# Patient Record
Sex: Female | Born: 1935 | Race: White | Hispanic: No | Marital: Married | Smoking: Never smoker
Health system: Southern US, Community
[De-identification: ages and names within clinical notes are randomized; demographics above are authoritative.]

## PROBLEM LIST (undated history)

## (undated) DIAGNOSIS — J449 Chronic obstructive pulmonary disease, unspecified: Secondary | ICD-10-CM

## (undated) DIAGNOSIS — E039 Hypothyroidism, unspecified: Secondary | ICD-10-CM

## (undated) DIAGNOSIS — I4891 Unspecified atrial fibrillation: Secondary | ICD-10-CM

## (undated) DIAGNOSIS — F0390 Unspecified dementia without behavioral disturbance: Secondary | ICD-10-CM

## (undated) DIAGNOSIS — E0590 Thyrotoxicosis, unspecified without thyrotoxic crisis or storm: Secondary | ICD-10-CM

## (undated) DIAGNOSIS — I9589 Other hypotension: Secondary | ICD-10-CM

## (undated) DIAGNOSIS — G4700 Insomnia, unspecified: Secondary | ICD-10-CM

## (undated) DIAGNOSIS — W19XXXA Unspecified fall, initial encounter: Secondary | ICD-10-CM

## (undated) DIAGNOSIS — G3184 Mild cognitive impairment, so stated: Secondary | ICD-10-CM

## (undated) DIAGNOSIS — I5032 Chronic diastolic (congestive) heart failure: Secondary | ICD-10-CM

## (undated) DIAGNOSIS — I10 Essential (primary) hypertension: Secondary | ICD-10-CM

## (undated) DIAGNOSIS — E785 Hyperlipidemia, unspecified: Secondary | ICD-10-CM

## (undated) HISTORY — PX: ABDOMINAL HYSTERECTOMY: SHX81

## (undated) HISTORY — DX: Mild cognitive impairment of uncertain or unknown etiology: G31.84

## (undated) HISTORY — PX: OTHER SURGICAL HISTORY: SHX169

## (undated) HISTORY — DX: Insomnia, unspecified: G47.00

## (undated) HISTORY — PX: CHOLECYSTECTOMY: SHX55

## (undated) HISTORY — PX: APPENDECTOMY: SHX54

---

## 2020-12-09 HISTORY — PX: OTHER SURGICAL HISTORY: SHX169

## 2021-03-02 ENCOUNTER — Emergency Department (HOSPITAL_BASED_OUTPATIENT_CLINIC_OR_DEPARTMENT_OTHER)
Admission: EM | Admit: 2021-03-02 | Discharge: 2021-03-03 | Disposition: A | Discharge status: Home / Self Care | Payer: Medicare Other | Attending: Emergency Medicine | Admitting: Emergency Medicine

## 2021-03-02 ENCOUNTER — Encounter (HOSPITAL_BASED_OUTPATIENT_CLINIC_OR_DEPARTMENT_OTHER): Payer: Self-pay | Admitting: *Deleted

## 2021-03-02 ENCOUNTER — Other Ambulatory Visit: Payer: Self-pay

## 2021-03-02 ENCOUNTER — Emergency Department (HOSPITAL_BASED_OUTPATIENT_CLINIC_OR_DEPARTMENT_OTHER): Payer: Medicare Other

## 2021-03-02 DIAGNOSIS — I10 Essential (primary) hypertension: Secondary | ICD-10-CM | POA: Diagnosis not present

## 2021-03-02 DIAGNOSIS — S8002XA Contusion of left knee, initial encounter: Secondary | ICD-10-CM | POA: Insufficient documentation

## 2021-03-02 DIAGNOSIS — Z7901 Long term (current) use of anticoagulants: Secondary | ICD-10-CM | POA: Diagnosis not present

## 2021-03-02 DIAGNOSIS — I4891 Unspecified atrial fibrillation: Secondary | ICD-10-CM | POA: Diagnosis not present

## 2021-03-02 DIAGNOSIS — Z79899 Other long term (current) drug therapy: Secondary | ICD-10-CM | POA: Insufficient documentation

## 2021-03-02 DIAGNOSIS — J449 Chronic obstructive pulmonary disease, unspecified: Secondary | ICD-10-CM | POA: Diagnosis not present

## 2021-03-02 DIAGNOSIS — W19XXXA Unspecified fall, initial encounter: Secondary | ICD-10-CM

## 2021-03-02 DIAGNOSIS — M25562 Pain in left knee: Secondary | ICD-10-CM

## 2021-03-02 DIAGNOSIS — S8992XA Unspecified injury of left lower leg, initial encounter: Secondary | ICD-10-CM | POA: Diagnosis present

## 2021-03-02 DIAGNOSIS — W050XXA Fall from non-moving wheelchair, initial encounter: Secondary | ICD-10-CM | POA: Diagnosis not present

## 2021-03-02 HISTORY — DX: Essential (primary) hypertension: I10

## 2021-03-02 HISTORY — DX: Thyrotoxicosis, unspecified without thyrotoxic crisis or storm: E05.90

## 2021-03-02 HISTORY — DX: Chronic obstructive pulmonary disease, unspecified: J44.9

## 2021-03-02 HISTORY — DX: Unspecified atrial fibrillation: I48.91

## 2021-03-02 MED ORDER — ACETAMINOPHEN 500 MG PO TABS
1000.0000 mg | ORAL_TABLET | Freq: Once | ORAL | Status: AC
  Administered 2021-03-02: 1000 mg via ORAL
  Filled 2021-03-02: qty 2

## 2021-03-02 NOTE — ED Provider Notes (Signed)
[facility] EMERGENCY DEPT Provider Note   CSN: [id number] Arrival date & time: 03/02/21  2014     History Chief Complaint  Patient presents with   Knee Pain    Fall     Lindsay Rush is a 86 y.o. female.  HPI     86 year old female with a history of atrial fibrillation, COPD, hypertension, thyrotoxicosis, left total knee surgery approximately 11 weeks ago, presents with concern for fall.  She slid out of her wheelchair and landed on both knees, now has left knee/lower leg pain.  She has been nonweightbearing on her left knee since her knee surgery per patient, however per daughter she walks down the hallway of assisted living facility with her walker to lobby three times a week.  She did not hit her head. Has been taking xarelto.  Thinks the seat of the wheelchair slid and she slid out and landed on both knees, does not have pain in right knee, does have left knee pain worse than what she had related to her recent arthroplasty. Denies other areas of pain, other acute illness. Ha previous admission for CHF while in [place], has been improved, not having dyspnea> Did note some right leg swelling. Has been taking xarelto at facility.  Past Medical History:  Diagnosis Date   Atrial fibrillation (HCC)    COPD (chronic obstructive pulmonary disease) (HCC)    Hypertension    Thyrotoxicosis     There are no problems to display for this patient.   History reviewed. No pertinent surgical history.   OB History   No obstetric history on file.     No family history on file.     Home Medications Prior to Admission medications   Medication Sig Start Date End Date Taking? Authorizing Provider  escitalopram (LEXAPRO) 5 MG tablet Take 5 mg by mouth daily.   Yes [provider]  fludrocortisone (FLORINEF) 0.1 MG tablet Take 0.1 mg by mouth daily.   Yes [provider]  furosemide (LASIX) 40 MG tablet Take 40 mg by mouth.   Yes [provider]   midodrine (PROAMATINE) 5 MG tablet Take 5 mg by mouth 3 (three) times daily with meals.   Yes [provider]  Rivaroxaban (XARELTO) 15 MG TABS tablet Take 15 mg by mouth 2 (two) times daily with a meal.   Yes [provider]    Allergies    Codeine  Review of Systems   Review of Systems  Constitutional:  Negative for fever.  Respiratory:  Negative for cough and shortness of breath.   Cardiovascular:  Positive for leg swelling. Negative for chest pain.  Gastrointestinal:  Negative for nausea and vomiting.  Musculoskeletal:  Positive for arthralgias. Negative for back pain and neck pain.  Skin:  Negative for rash.  Neurological:  Negative for syncope and headaches.   Physical Exam Updated Vital Signs BP (!) 170/58   Pulse (!) 59   Temp 98.3 F (36.8 C) (Oral)   Resp (!) 31   Ht 5\' 2"  (1.575 m)   Wt 59 kg   SpO2 93%   BMI 23.78 kg/m   Physical Exam Vitals and nursing note reviewed.  Constitutional:      General: She is not in acute distress.    Appearance: She is well-developed. She is not diaphoretic.  HENT:     Head: Normocephalic and atraumatic.  Eyes:     Conjunctiva/sclera: Conjunctivae normal.  Cardiovascular:     Rate and Rhythm: Normal  rate. Rhythm irregular.  Pulmonary:     Effort: Pulmonary effort is normal. No respiratory distress.  Abdominal:     Palpations: Abdomen is soft.  Musculoskeletal:        General: Swelling and tenderness (left knee) present.     Cervical back: Normal range of motion.  Skin:    General: Skin is warm and dry.     Findings: Bruising present. No erythema or rash.  Neurological:     Mental Status: She is alert and oriented to person, place, and time.    ED Results / Procedures / Treatments   Labs (all labs ordered are listed, but only abnormal results are displayed) Labs Reviewed - No data to display  EKG EKG Interpretation  Date/Time:  Sunday March 02 2021 21:33:57 EDT Ventricular Rate:  55 PR  Interval:    QRS Duration: 86 QT Interval:  426 QTC Calculation: 408 R Axis:   68 Text Interpretation: Atrial fibrillation Repol abnrm, severe global ischemia (LM/MVD) Discussed with Dr. Ganji, atrial fibrillation Confirmed by Juniper Cobey ([id number]) on 03/02/2021 10:01:10 PM  Radiology DG Knee Complete 4 Views Left  Result Date: 03/02/2021 CLINICAL DATA:  Fall and trauma to the left knee. EXAM: LEFT KNEE - COMPLETE 4+ VIEW COMPARISON:  None. FINDINGS: There is a total left knee arthroplasty. The arthroplasty components appear intact and in anatomic alignment. There is no acute fracture or dislocation. The bones are osteopenic. No significant joint effusion. The soft tissues are grossly unremarkable. IMPRESSION: 1. No acute fracture or dislocation. 2. Left knee arthroplasty appears intact and in anatomic alignment. Electronically Signed   By: Arash  Radparvar M.D.   On: 03/02/2021 22:16    Procedures Procedures   Medications Ordered in ED Medications  acetaminophen (TYLENOL) tablet 1,000 mg (1,000 mg Oral Given 03/02/21 2207)    ED Course  I have reviewed the triage vital signs and the nursing notes.  Pertinent labs & imaging results that were available during my care of the patient were reviewed by me and considered in my medical decision making (see chart for details).    MDM Rules/Calculators/A&P                           85  year old female with a history of atrial fibrillation, COPD, hypertension, thyrotoxicosis, left total knee surgery approximately 11 weeks ago, presents with concern for fall, sliding out of wheelchair onto her knees.  Is on anticoagulation but no head injury, no headache, no n/v, and given mechanism without head injury have low suspicion for ICH.  Denies other injuries or acute medical concerns.  HR was down to 40s with pulse oximeter HR, EKG obtained and discussed with Dr. Cardiology, consistent with atrial fibrillation.  XR knee without fracture. She  was able to stand per her baseline, low suspicion at this time for occult fracture and feel increasd pain is likely secondary to bruising.  Recommend Orthopedic follow up.   She is new to the area and needs to establish care with specialists for her chronic medical problems.  Provide number for urologist for discussion of chronic foley/removal, Cardiologist, and Orthopedics.    Final Clinical Impression(s) / ED Diagnoses Final diagnoses:  Fall, initial encounter  Acute pain of left knee  Atrial fibrillation, unspecified type [facility])    Rx / DC Orders ED Discharge Orders     None        [facility], MD 03/03/21 [phone]

## 2021-03-02 NOTE — ED Notes (Signed)
MD at bedside. 

## 2021-03-02 NOTE — ED Triage Notes (Signed)
Pt arrived by ptar with c/o fall approx 5 hours pta. Pt states she slid out of her wheelchair and landed on both knees. C/o left knee/lower leg pain. Pt states she has been non weight bearing since her surgery. Pt states she has a foley cath inserted after her surgery but is not sure why she has the foley. Denies hitting her head when she fell.
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
## 2021-03-02 NOTE — ED Notes (Signed)
PT stood at bedside and ambulated with assistance. Pt states she feels like she is walking at her normal gait and capacity and is not hurting in her left knee.

## 2021-04-03 ENCOUNTER — Other Ambulatory Visit: Payer: Self-pay

## 2021-04-03 ENCOUNTER — Emergency Department (HOSPITAL_COMMUNITY): Payer: Medicare Other

## 2021-04-03 ENCOUNTER — Inpatient Hospital Stay (HOSPITAL_COMMUNITY)
Admission: EM | Admit: 2021-04-03 | Discharge: 2021-04-08 | DRG: 291 | Disposition: A | Discharge status: Home / Self Care | Payer: Medicare Other | Attending: Internal Medicine | Admitting: Internal Medicine

## 2021-04-03 DIAGNOSIS — I110 Hypertensive heart disease with heart failure: Principal | ICD-10-CM | POA: Diagnosis present

## 2021-04-03 DIAGNOSIS — E039 Hypothyroidism, unspecified: Secondary | ICD-10-CM | POA: Diagnosis present

## 2021-04-03 DIAGNOSIS — J9601 Acute respiratory failure with hypoxia: Secondary | ICD-10-CM | POA: Diagnosis present

## 2021-04-03 DIAGNOSIS — Z8249 Family history of ischemic heart disease and other diseases of the circulatory system: Secondary | ICD-10-CM

## 2021-04-03 DIAGNOSIS — Z7901 Long term (current) use of anticoagulants: Secondary | ICD-10-CM

## 2021-04-03 DIAGNOSIS — F0390 Unspecified dementia without behavioral disturbance: Secondary | ICD-10-CM | POA: Diagnosis present

## 2021-04-03 DIAGNOSIS — Z79891 Long term (current) use of opiate analgesic: Secondary | ICD-10-CM

## 2021-04-03 DIAGNOSIS — B961 Klebsiella pneumoniae [K. pneumoniae] as the cause of diseases classified elsewhere: Secondary | ICD-10-CM | POA: Diagnosis present

## 2021-04-03 DIAGNOSIS — E785 Hyperlipidemia, unspecified: Secondary | ICD-10-CM | POA: Diagnosis present

## 2021-04-03 DIAGNOSIS — J449 Chronic obstructive pulmonary disease, unspecified: Secondary | ICD-10-CM | POA: Diagnosis present

## 2021-04-03 DIAGNOSIS — R0902 Hypoxemia: Secondary | ICD-10-CM

## 2021-04-03 DIAGNOSIS — I5033 Acute on chronic diastolic (congestive) heart failure: Secondary | ICD-10-CM | POA: Diagnosis present

## 2021-04-03 DIAGNOSIS — I341 Nonrheumatic mitral (valve) prolapse: Secondary | ICD-10-CM | POA: Diagnosis present

## 2021-04-03 DIAGNOSIS — W06XXXA Fall from bed, initial encounter: Secondary | ICD-10-CM | POA: Diagnosis present

## 2021-04-03 DIAGNOSIS — Z885 Allergy status to narcotic agent status: Secondary | ICD-10-CM

## 2021-04-03 DIAGNOSIS — Z9181 History of falling: Secondary | ICD-10-CM

## 2021-04-03 DIAGNOSIS — I700 Atherosclerosis of aorta: Secondary | ICD-10-CM | POA: Diagnosis present

## 2021-04-03 DIAGNOSIS — I4821 Permanent atrial fibrillation: Secondary | ICD-10-CM | POA: Diagnosis present

## 2021-04-03 DIAGNOSIS — Z79899 Other long term (current) drug therapy: Secondary | ICD-10-CM

## 2021-04-03 DIAGNOSIS — E876 Hypokalemia: Secondary | ICD-10-CM | POA: Diagnosis present

## 2021-04-03 DIAGNOSIS — T1490XA Injury, unspecified, initial encounter: Secondary | ICD-10-CM

## 2021-04-03 DIAGNOSIS — Z8673 Personal history of transient ischemic attack (TIA), and cerebral infarction without residual deficits: Secondary | ICD-10-CM

## 2021-04-03 DIAGNOSIS — Z1611 Resistance to penicillins: Secondary | ICD-10-CM | POA: Diagnosis present

## 2021-04-03 DIAGNOSIS — E119 Type 2 diabetes mellitus without complications: Secondary | ICD-10-CM | POA: Diagnosis present

## 2021-04-03 DIAGNOSIS — N390 Urinary tract infection, site not specified: Secondary | ICD-10-CM | POA: Diagnosis present

## 2021-04-03 DIAGNOSIS — G9341 Metabolic encephalopathy: Secondary | ICD-10-CM | POA: Diagnosis present

## 2021-04-03 DIAGNOSIS — F03A0 Unspecified dementia, mild, without behavioral disturbance, psychotic disturbance, mood disturbance, and anxiety: Secondary | ICD-10-CM | POA: Diagnosis present

## 2021-04-03 DIAGNOSIS — Z79890 Hormone replacement therapy (postmenopausal): Secondary | ICD-10-CM

## 2021-04-03 DIAGNOSIS — I9589 Other hypotension: Secondary | ICD-10-CM | POA: Diagnosis present

## 2021-04-03 DIAGNOSIS — S0003XA Contusion of scalp, initial encounter: Secondary | ICD-10-CM | POA: Diagnosis present

## 2021-04-03 DIAGNOSIS — Z7951 Long term (current) use of inhaled steroids: Secondary | ICD-10-CM

## 2021-04-03 DIAGNOSIS — Z20822 Contact with and (suspected) exposure to covid-19: Secondary | ICD-10-CM | POA: Diagnosis present

## 2021-04-03 DIAGNOSIS — I482 Chronic atrial fibrillation, unspecified: Secondary | ICD-10-CM | POA: Diagnosis present

## 2021-04-03 DIAGNOSIS — R0600 Dyspnea, unspecified: Secondary | ICD-10-CM

## 2021-04-03 HISTORY — DX: Hyperlipidemia, unspecified: E78.5

## 2021-04-03 HISTORY — DX: Unspecified fall, initial encounter: W19.XXXA

## 2021-04-03 HISTORY — DX: Chronic diastolic (congestive) heart failure: I50.32

## 2021-04-03 HISTORY — DX: Unspecified dementia, unspecified severity, without behavioral disturbance, psychotic disturbance, mood disturbance, and anxiety: F03.90

## 2021-04-03 HISTORY — DX: Other hypotension: I95.89

## 2021-04-03 HISTORY — DX: Hypothyroidism, unspecified: E03.9

## 2021-04-03 LAB — CBC
HCT: 41.9 % (ref 36.0–46.0)
Hemoglobin: 13.2 g/dL (ref 12.0–15.0)
MCH: 30.1 pg (ref 26.0–34.0)
MCHC: 31.5 g/dL (ref 30.0–36.0)
MCV: 95.4 fL (ref 80.0–100.0)
Platelets: 291 10*3/uL (ref 150–400)
RBC: 4.39 MIL/uL (ref 3.87–5.11)
RDW: 15 % (ref 11.5–15.5)
WBC: 10.3 10*3/uL (ref 4.0–10.5)
nRBC: 0 % (ref 0.0–0.2)

## 2021-04-03 LAB — I-STAT CHEM 8, ED
BUN: 6 mg/dL — ABNORMAL LOW (ref 8–23)
Calcium, Ion: 0.99 mmol/L — ABNORMAL LOW (ref 1.15–1.40)
Chloride: 101 mmol/L (ref 98–111)
Creatinine, Ser: 0.7 mg/dL (ref 0.44–1.00)
Glucose, Bld: 99 mg/dL (ref 70–99)
HCT: 42 % (ref 36.0–46.0)
Hemoglobin: 14.3 g/dL (ref 12.0–15.0)
Potassium: 3.3 mmol/L — ABNORMAL LOW (ref 3.5–5.1)
Sodium: 140 mmol/L (ref 135–145)
TCO2: 28 mmol/L (ref 22–32)

## 2021-04-03 LAB — PROTIME-INR
INR: 2.4 — ABNORMAL HIGH (ref 0.8–1.2)
Prothrombin Time: 26.2 seconds — ABNORMAL HIGH (ref 11.4–15.2)

## 2021-04-03 LAB — COMPREHENSIVE METABOLIC PANEL
ALT: 9 U/L (ref 0–44)
AST: 16 U/L (ref 15–41)
Albumin: 3.5 g/dL (ref 3.5–5.0)
Alkaline Phosphatase: 59 U/L (ref 38–126)
Anion gap: 10 (ref 5–15)
BUN: 5 mg/dL — ABNORMAL LOW (ref 8–23)
CO2: 28 mmol/L (ref 22–32)
Calcium: 8.9 mg/dL (ref 8.9–10.3)
Chloride: 100 mmol/L (ref 98–111)
Creatinine, Ser: 0.77 mg/dL (ref 0.44–1.00)
GFR, Estimated: >60 mL/min (ref >60)
Glucose, Bld: 100 mg/dL — ABNORMAL HIGH (ref 70–99)
Potassium: 3 mmol/L — ABNORMAL LOW (ref 3.5–5.1)
Sodium: 138 mmol/L (ref 135–145)
Total Bilirubin: 0.9 mg/dL (ref 0.3–1.2)
Total Protein: 7.4 g/dL (ref 6.5–8.1)

## 2021-04-03 LAB — RESP PANEL BY RT-PCR (FLU A&B, COVID) ARPGX2
Influenza A by PCR: NEGATIVE
Influenza B by PCR: NEGATIVE
SARS Coronavirus 2 by RT PCR: NEGATIVE

## 2021-04-03 LAB — URINALYSIS, ROUTINE W REFLEX MICROSCOPIC
Bilirubin Urine: NEGATIVE
Glucose, UA: NEGATIVE mg/dL
Hgb urine dipstick: NEGATIVE
Ketones, ur: NEGATIVE mg/dL
Nitrite: POSITIVE — AB
Protein, ur: NEGATIVE mg/dL
Specific Gravity, Urine: 1.006 (ref 1.005–1.030)
pH: 7 (ref 5.0–8.0)

## 2021-04-03 LAB — CBG MONITORING, ED: Glucose-Capillary: 101 mg/dL — ABNORMAL HIGH (ref 70–99)

## 2021-04-03 LAB — SAMPLE TO BLOOD BANK

## 2021-04-03 LAB — LACTIC ACID, PLASMA: Lactic Acid, Venous: 1.9 mmol/L (ref 0.5–1.9)

## 2021-04-03 LAB — ETHANOL: Alcohol, Ethyl (B): <10 mg/dL (ref <10)

## 2021-04-03 MED ORDER — SODIUM CHLORIDE 0.9 % IV SOLN
1.0000 g | Freq: Once | INTRAVENOUS | Status: AC
  Administered 2021-04-03: 1 g via INTRAVENOUS
  Filled 2021-04-03: qty 10

## 2021-04-03 MED ORDER — SODIUM CHLORIDE 0.9 % IV SOLN
500.0000 mg | Freq: Once | INTRAVENOUS | Status: AC
  Administered 2021-04-04: 500 mg via INTRAVENOUS
  Filled 2021-04-03: qty 500

## 2021-04-03 MED ORDER — IOHEXOL 300 MG/ML  SOLN
75.0000 mL | Freq: Once | INTRAMUSCULAR | Status: AC | PRN
  Administered 2021-04-03: 75 mL via INTRAVENOUS

## 2021-04-03 NOTE — ED Notes (Signed)
Patient transported to CT 

## 2021-04-03 NOTE — ED Notes (Signed)
Pt back from CT

## 2021-04-03 NOTE — ED Notes (Signed)
MD placed C-collar upon arrival

## 2021-04-03 NOTE — ED Notes (Signed)
Sent  urine culture with the urine specimen 

## 2021-04-03 NOTE — ED Notes (Signed)
Pt O2 89, placed on 4L [place]. O2 now 95

## 2021-04-03 NOTE — ED Notes (Signed)
RN aware of pt BP 

## 2021-04-03 NOTE — ED Provider Notes (Signed)
[facility] EMERGENCY DEPARTMENT Provider Note   CSN: [id number] Arrival date & time: 04/03/21  1757     History Chief Complaint  Patient presents with   Lytle Michaels    Lindsay Rush is a 86 y.o. female.  The history is provided by the patient, the nursing home and the EMS personnel.  Trauma Mechanism of injury: Fall Injury location: head/neck and hand Injury location detail: head and L hand Incident location: nursing home Time since incident: 2 hours Arrived directly from scene: yes   Fall:      Fall occurred: in unknown circumstances      Impact surface: unknown      Point of impact: head      Suspicion of alcohol use: no      Suspicion of drug use: no  EMS/PTA data:      Ambulatory at scene: no      Blood loss: none      Responsiveness: alert      Oriented to: person      IV access: established      Immobilization: C-collar      Airway condition since incident: stable      Breathing condition since incident: stable      Circulation condition since incident: stable  Current symptoms:      Pain quality: aching      Pain timing: constant      Associated symptoms:            Reports headache and neck pain.            Denies abdominal pain, back pain, chest pain, seizures and vomiting.      Past Medical History:  Diagnosis Date   Atrial fibrillation ([place])    COPD (chronic obstructive pulmonary disease) ([place])    Hypertension    Thyrotoxicosis     Patient Active Problem List   Diagnosis Date Noted   Acute respiratory failure with hypoxia ([place]) 04/03/2021     No past surgical history on file.   OB History   No obstetric history on file.     No family history on file.     Home Medications Prior to Admission medications   Medication Sig Start Date End Date Taking? Authorizing Provider  acetaminophen (TYLENOL) 325 MG tablet Take 650 mg by mouth every 6 (six) hours as needed for moderate pain or headache.   Yes [provider]  digoxin (LANOXIN) 0.25 MG tablet Take 0.25 mg by mouth daily.   Yes [provider]  escitalopram (LEXAPRO) 5 MG tablet Take 5 mg by mouth daily.   Yes [provider]  ezetimibe (ZETIA) 10 MG tablet Take 10 mg by mouth at bedtime.   Yes [provider]  fludrocortisone (FLORINEF) 0.1 MG tablet Take 0.1 mg by mouth daily.   Yes [provider]  Fluticasone-Umeclidin-Vilant (TRELEGY ELLIPTA) 200-62.5-25 MCG/ACT AEPB Inhale 1 puff into the lungs daily.   Yes [provider]  furosemide (LASIX) 40 MG tablet Take 40 mg by mouth daily.   Yes [provider]  midodrine (PROAMATINE) 2.5 MG tablet Take 2.5 mg by mouth 3 (three) times daily with meals.   Yes [provider]  polyethylene glycol powder (GLYCOLAX/MIRALAX) 17 GM/SCOOP powder Take 17 g by mouth daily.   Yes [provider]  Rivaroxaban (XARELTO) 15 MG TABS tablet Take 15 mg by mouth daily.   Yes [provider]  thyroid (ARMOUR) 30 MG tablet Take 30 mg  by mouth daily before breakfast.   Yes [provider]  traZODone (DESYREL) 50 MG tablet Take 50 mg by mouth at bedtime.   Yes [provider]    Allergies    Codeine  Review of Systems   Review of Systems  Constitutional:  Negative for chills and fever.  HENT:  Negative for ear pain and sore throat.   Eyes:  Negative for pain and visual disturbance.  Respiratory:  Positive for cough. Negative for shortness of breath.   Cardiovascular:  Negative for chest pain and palpitations.  Gastrointestinal:  Negative for abdominal pain and vomiting.  Genitourinary:  Negative for dysuria and hematuria.  Musculoskeletal:  Positive for neck pain. Negative for arthralgias and back pain.       L hand pain  Skin:  Negative for color change and rash.  Neurological:  Positive for headaches. Negative for seizures and syncope.  All other systems reviewed and are negative.  Physical Exam Updated Vital  Signs BP (!) 158/66   Pulse 69   Temp 98.9 F (37.2 C) (Temporal)   Resp (!) 24   Ht 5\' 2"  (1.575 m)   Wt 59 kg   SpO2 96%   BMI 23.78 kg/m   Physical Exam Vitals and nursing note reviewed.  Constitutional:      General: She is not in acute distress.    Appearance: Normal appearance. She is well-developed.  HENT:     Head: Normocephalic.     Comments: Small hematoma to the occiput.  Hemostatic.  No underlying crepitus.    Right Ear: External ear normal.     Left Ear: External ear normal.     Nose: Nose normal. No congestion or rhinorrhea.     Mouth/Throat:     Mouth: Mucous membranes are moist.  Eyes:     Extraocular Movements: Extraocular movements intact.     Conjunctiva/sclera: Conjunctivae normal.     Pupils: Pupils are equal, round, and reactive to light.  Cardiovascular:     Rate and Rhythm: Normal rate and regular rhythm.     Pulses: Normal pulses.     Heart sounds: No murmur heard. Pulmonary:     Effort: Pulmonary effort is normal. No respiratory distress.     Breath sounds: Normal breath sounds. No wheezing, rhonchi or rales.  Abdominal:     General: Abdomen is flat. Bowel sounds are normal.     Palpations: Abdomen is soft.     Tenderness: There is no abdominal tenderness. There is no right CVA tenderness, left CVA tenderness, guarding or rebound.     Comments: No abdominal wall ecchymosis  Musculoskeletal:        General: No swelling, tenderness or deformity.     Cervical back: Normal range of motion and neck supple. No rigidity.  Skin:    General: Skin is warm and dry.     Capillary Refill: Capillary refill takes less than 2 seconds.  Neurological:     General: No focal deficit present.     Mental Status: She is alert. Mental status is at baseline. She is disoriented.     GCS: GCS eye subscore is 4. GCS verbal subscore is 4. GCS motor subscore is 6.     Cranial Nerves: Cranial nerves 2-12 are intact. No cranial nerve deficit.     Sensory: Sensation is  intact.     Motor: Motor function is intact. No weakness, tremor or abnormal muscle tone.     Coordination: Coordination is intact.  Psychiatric:        Mood and Affect: Mood normal.    ED Results / Procedures / Treatments   Labs (all labs ordered are listed, but only abnormal results are displayed) Labs Reviewed  COMPREHENSIVE METABOLIC PANEL - Abnormal; Notable for the following components:      Result Value   Potassium 3.0 (*)    Glucose, Bld 100 (*)    BUN 5 (*)    All other components within normal limits  URINALYSIS, ROUTINE W REFLEX MICROSCOPIC - Abnormal; Notable for the following components:   APPearance HAZY (*)    Nitrite POSITIVE (*)    Leukocytes,Ua SMALL (*)    Bacteria, UA MANY (*)    All other components within normal limits  PROTIME-INR - Abnormal; Notable for the following components:   Prothrombin Time 26.2 (*)    INR 2.4 (*)    All other components within normal limits  CBG MONITORING, ED - Abnormal; Notable for the following components:   Glucose-Capillary 101 (*)    All other components within normal limits  I-STAT CHEM 8, ED - Abnormal; Notable for the following components:   Potassium 3.3 (*)    BUN 6 (*)    Calcium, Ion 0.99 (*)    All other components within normal limits  RESP PANEL BY RT-PCR (FLU A&B, COVID) ARPGX2  CBC  ETHANOL  LACTIC ACID, PLASMA  BRAIN NATRIURETIC PEPTIDE  MAGNESIUM  PROCALCITONIN  SAMPLE TO BLOOD BANK    EKG EKG Interpretation  Date/Time:  Thursday April 03 2021 18:03:16 EST Ventricular Rate:  81 PR Interval:    QRS Duration: 75 QT Interval:  390 QTC Calculation: 453 R Axis:   83 Text Interpretation: Atrial fibrillation Borderline right axis deviation Consider left ventricular hypertrophy Repol abnrm, severe global ischemia (LM/MVD) No significant change since last tracing Confirmed by Gwyneth Sprout ([id number]) on 04/03/2021 6:13:33 PM  Radiology CT HEAD WO CONTRAST  Result Date: 04/03/2021 CLINICAL  DATA:  Trauma.  Pain.  Patient on blood thinners. EXAM: CT HEAD WITHOUT CONTRAST CT CERVICAL SPINE WITHOUT CONTRAST TECHNIQUE: Multidetector CT imaging of the head and cervical spine was performed following the standard protocol without intravenous contrast. Multiplanar CT image reconstructions of the cervical spine were also generated. COMPARISON:  None. FINDINGS: CT HEAD FINDINGS Brain: No evidence of acute infarction, hemorrhage, hydrocephalus, extra-axial collection or mass lesion/mass effect. Vascular: Calcified atherosclerosis in the intracranial carotids. Skull: Normal. Negative for fracture or focal lesion. Sinuses/Orbits: No acute finding. Other: None. CT CERVICAL SPINE FINDINGS Alignment: Normal. Skull base and vertebrae: No acute fracture. No primary bone lesion or focal pathologic process. Soft tissues and spinal canal: No prevertebral fluid or swelling. No visible canal hematoma. Disc levels:  Multilevel degenerative disc disease. Upper chest: No acute abnormalities. Other: No other abnormalities. IMPRESSION: 1. No acute intracranial abnormalities. 2. No cervical spine fracture or traumatic malalignment. Electronically Signed   By: Gerome Sam III M.D.   On: 04/03/2021 18:53   CT Chest W Contrast  Result Date: 04/03/2021 CLINICAL DATA:  Larey Seat, anticoagulated, cough EXAM: CT CHEST WITH CONTRAST TECHNIQUE: Multidetector CT imaging of the chest was performed during intravenous contrast administration. CONTRAST:  14mL OMNIPAQUE IOHEXOL 300 MG/ML  SOLN COMPARISON:  04/03/2021 FINDINGS: Cardiovascular: The heart is enlarged, with prominent biatrial dilatation. No pericardial effusion. No evidence of thoracic aortic aneurysm or dissection. Mild atherosclerosis. Mediastinum/Nodes: Borderline enlarged mediastinal and hilar lymph nodes are identified, measuring up to 11 mm in the precarinal region. These are likely  reactive. Thyroid, trachea, and esophagus are unremarkable. Lungs/Pleura: There is diffuse  interlobular septal thickening, with scattered bilateral areas of nodular airspace disease. This could reflect early edema or atypical infection such as viral pneumonia. No effusion or pneumothorax. The central airways are patent. Upper Abdomen: No acute abnormality. Multiple hepatic hypodensities compatible with cysts. Musculoskeletal: No acute or destructive bony lesions. Reconstructed images demonstrate no additional findings. IMPRESSION: 1. Diffuse interlobular septal thickening with patchy areas of airspace disease bilaterally. Viral pneumonia or early edema could give this appearance. 2. Marked cardiomegaly. 3.  Aortic Atherosclerosis (ICD10-I70.0). Electronically Signed   By: Randa Ngo M.D.   On: 04/03/2021 22:39   CT CERVICAL SPINE WO CONTRAST  Result Date: 04/03/2021 CLINICAL DATA:  Trauma.  Pain.  Patient on blood thinners. EXAM: CT HEAD WITHOUT CONTRAST CT CERVICAL SPINE WITHOUT CONTRAST TECHNIQUE: Multidetector CT imaging of the head and cervical spine was performed following the standard protocol without intravenous contrast. Multiplanar CT image reconstructions of the cervical spine were also generated. COMPARISON:  None. FINDINGS: CT HEAD FINDINGS Brain: No evidence of acute infarction, hemorrhage, hydrocephalus, extra-axial collection or mass lesion/mass effect. Vascular: Calcified atherosclerosis in the intracranial carotids. Skull: Normal. Negative for fracture or focal lesion. Sinuses/Orbits: No acute finding. Other: None. CT CERVICAL SPINE FINDINGS Alignment: Normal. Skull base and vertebrae: No acute fracture. No primary bone lesion or focal pathologic process. Soft tissues and spinal canal: No prevertebral fluid or swelling. No visible canal hematoma. Disc levels:  Multilevel degenerative disc disease. Upper chest: No acute abnormalities. Other: No other abnormalities. IMPRESSION: 1. No acute intracranial abnormalities. 2. No cervical spine fracture or traumatic malalignment.  Electronically Signed   By: Dorise Bullion III M.D.   On: 04/03/2021 18:53   DG Pelvis Portable  Result Date: 04/03/2021 CLINICAL DATA:  Fall.  Patient on blood thinners. EXAM: PORTABLE PELVIS 1-2 VIEWS COMPARISON:  None. FINDINGS: There mild symmetric degenerative changes of the hips. There is no evidence of fracture or dislocation. Degenerative change of the spine. IMPRESSION: No acute findings. Electronically Signed   By: Marin Olp M.D.   On: 04/03/2021 18:25   DG Chest Port 1 View  Result Date: 04/03/2021 CLINICAL DATA:  Fall.  Patient on blood thinners. EXAM: PORTABLE CHEST 1 VIEW COMPARISON:  None. FINDINGS: Lungs are adequately inflated and demonstrate bilateral diffuse hazy interstitial prominence which may be acute or chronic. No lobar consolidation or effusion. No pneumothorax. Borderline cardiomegaly. No evidence of fracture. IMPRESSION: 1. No acute fracture. 2. Diffuse hazy interstitial prominence which may be acute or chronic. Acute infection is possible. 3. Borderline cardiomegaly. Electronically Signed   By: Marin Olp M.D.   On: 04/03/2021 18:25   DG Hand Complete Left  Result Date: 04/03/2021 CLINICAL DATA:  Fall.  Patient on blood thinners. EXAM: LEFT HAND - COMPLETE 3+ VIEW COMPARISON:  None. FINDINGS: There mild degenerate changes over the radiocarpal joint, carpal bones and first metacarpal joints. There mild degenerate changes over the MCP joints and interphalangeal joints. Bone alignment and mineralization is normal. There are no bony erosions. No acute fracture or dislocation. IMPRESSION: 1. No acute findings. 2. Degenerative changes as described. Electronically Signed   By: Marin Olp M.D.   On: 04/03/2021 18:27    Procedures Procedures   Medications Ordered in ED Medications  cefTRIAXone (ROCEPHIN) 1 g in sodium chloride 0.9 % 100 mL IVPB (1 g Intravenous New Bag/Given 04/03/21 2332)  azithromycin (ZITHROMAX) 500 mg in sodium chloride 0.9 % 250 mL IVPB (has  no administration in time range)  iohexol (OMNIPAQUE) 300 MG/ML solution 75 mL (75 mLs Intravenous Contrast Given 04/03/21 2229)    ED Course  I have reviewed the triage vital signs and the nursing notes.  Pertinent labs & imaging results that were available during my care of the patient were reviewed by me and considered in my medical decision making (see chart for details).    MDM Rules/Calculators/A&P                          86 year old female with a history of dementia and A. fib on Eliquis.  Presents to the ED following unwitnessed fall.  ABCs intact.  She does have a small hematoma to her occiput.  She denies loss of consciousness.  She does have mild diffuse cervical tenderness.  No step-offs or deformities.  She is moving all her extremities.  Mild left dorsal hand tenderness.  She has no other external signs of injury.  CT head and C-spine negative.  Chest, pelvis, left hand x-rays showed no acute fractures or dislocations.  On reassessment, she is resting comfortably.  She is hypoxic to the 80s requiring 4 L O2.  Mildly tachypneic.  Does admit to recent cough.  Chest x-ray did show hazy opacities.  In the setting of trauma, CT chest obtained. Findings concerning for diffuse interlobular septal thickening with patchy areas of airspace disease bilaterally c/w atypical infxn vs pulm edema. She remains tachypneic and hypoxic requiring 4 L. Will empirically tx w/ rocephin and azithro and admit for further management of hypoxic respiratory failure. Does not appear volume overloaded on exam. No indications for diuresis at this time. Hand off given to medicine team.   Final Clinical Impression(s) / ED Diagnoses Final diagnoses:  Trauma  Hypoxia  Dyspnea, unspecified type    Rx / DC Orders ED Discharge Orders     None        Idamae Lusher, MD 04/03/21 [id number]    Blanchie Dessert, MD 04/04/21 2021

## 2021-04-03 NOTE — ED Triage Notes (Addendum)
Pt bib [facility] for fall on blood thinners from [facility]. Pt is taking xarelto. Pt was laying in bed, and fell to the floor. Pt hit head, denies LOC, denies any n/v and dizziness. Pt c/o of tenderness to her the back of her head, and pain in her Lt knee and middle fingers in her Lt hand.
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
## 2021-04-03 NOTE — H&P (Signed)
History and Physical    Lindsay Rush BUL:[id number] DOB: 1934-09-22 DOA: 04/03/2021  PCP: Joycelyn Man, NP  Patient coming from: [facility]  I have personally briefly reviewed patient's old medical records in [facility]  Chief Complaint: Fall at facility  HPI: Lindsay Rush is a 86 y.o. female with medical history significant for chronic atrial fibrillation on Xarelto, COPD, ?  CHF, hypothyroidism, hyperlipidemia, dementia who presents to the ED from her facility for evaluation after a fall.  History is supplemented by EDP.  Patient brought in to ED via EMS from [facility] after fall.  She was apparently lying in bed when she fell to the floor.  She hit her head but did not lose consciousness.  She did not have any nausea, vomiting, dizziness.  She was complaining of pain in the back of her head, her left knee, and middle fingers in her left hand.  She is on chronic anticoagulation with Xarelto.  On admitting evaluation, patient does report some shortness of breath with intermittent cough.  She denies any chest pain, abdominal pain, dysuria, diarrhea.  ED Course:  Initial vitals showed BP 160/80, pulse 90, RR 22, temp 98.9 F, SPO2 89% on room air.  Patient placed on 4 L O2 via [place] with SPO2 improved to 95% per ED documentation.  Labs show WBC 10.3, hemoglobin 13.2, platelets 291,000, sodium 138, potassium 3.0, bicarb 28, BUN 5, creatinine 0.77, serum glucose 100, LFTs within normal limits, serum ethanol <10, lactic acid 1.9.  SARS-CoV-2 and influenza PCR negative.  Urinalysis shows positive nitrates, small leukocytes, 0-5 RBC/hpf, 11-20 WBC/hpf, many bacteria on microscopy.  Portable chest x-ray shows diffuse hazy interstitial prominence which may be acute or chronic, borderline cardiomegaly, no acute fracture.  Pelvic x-rays negative for acute findings.  Left hand x-ray negative for acute findings.  Mild degenerative changes noted.  CT head without  contrast negative for acute intracranial normalities.  CT cervical spine without contrast negative for cervical spine fracture or traumatic malalignment.  CT chest with contrast shows diffuse interlobular septal thickening with patchy areas of airspace disease bilaterally, marked cardiomegaly, aortic atherosclerosis.  Patient was given IV ceftriaxone and azithromycin.  The hospitalist service was consulted to admit for further evaluation and management of acute hypoxic respiratory failure.  Review of Systems:  All systems reviewed and are negative except as documented in history of present illness above.   Past Medical History:  Diagnosis Date   Atrial fibrillation (HCC)    COPD (chronic obstructive pulmonary disease) (HCC)    Hypertension    Thyrotoxicosis     No past surgical history on file.  Social History:  has no history on file for tobacco use, alcohol use, and drug use.  Allergies  Allergen Reactions   Codeine     No family history on file.   Prior to Admission medications   Medication Sig Start Date End Date Taking? Authorizing Provider  acetaminophen (TYLENOL) 325 MG tablet Take 650 mg by mouth every 6 (six) hours as needed for moderate pain or headache.   Yes [provider]  digoxin (LANOXIN) 0.25 MG tablet Take 0.25 mg by mouth daily.   Yes [provider]  escitalopram (LEXAPRO) 5 MG tablet Take 5 mg by mouth daily.   Yes [provider]  ezetimibe (ZETIA) 10 MG tablet Take 10 mg by mouth at bedtime.   Yes [provider]  fludrocortisone (FLORINEF) 0.1 MG tablet Take 0.1 mg by mouth daily.  Yes [provider]  Fluticasone-Umeclidin-Vilant (TRELEGY ELLIPTA) 200-62.5-25 MCG/ACT AEPB Inhale 1 puff into the lungs daily.   Yes [provider]  furosemide (LASIX) 40 MG tablet Take 40 mg by mouth daily.   Yes [provider]  midodrine (PROAMATINE) 2.5 MG tablet Take 2.5 mg by mouth 3 (three) times  daily with meals.   Yes [provider]  polyethylene glycol powder (GLYCOLAX/MIRALAX) 17 GM/SCOOP powder Take 17 g by mouth daily.   Yes [provider]  Rivaroxaban (XARELTO) 15 MG TABS tablet Take 15 mg by mouth daily.   Yes [provider]  thyroid (ARMOUR) 30 MG tablet Take 30 mg by mouth daily before breakfast.   Yes [provider]  traZODone (DESYREL) 50 MG tablet Take 50 mg by mouth at bedtime.   Yes [provider]    Physical Exam: Vitals:   04/03/21 2215 04/03/21 2230 04/03/21 2345 04/04/21 0000  BP: (!) 155/68 (!) 158/66 (!) 155/67 (!) 144/72  Pulse: 66 69 83 74  Resp: (!) 26 (!) 24 (!) 31 (!) 22  Temp:      TempSrc:      SpO2: 95% 96% (!) 83% 92%  Weight:      Height:       Constitutional: Elderly woman resting supine in bed, NAD, calm, comfortable Eyes: PERRL, lids and conjunctivae normal ENMT: Mucous membranes are moist. Posterior pharynx clear of any exudate or lesions.Normal dentition.  Neck: normal, supple, no masses. Respiratory: Bibasilar inspiratory crackles.  Normal respiratory effort while on 2 L O2 via [place]. No accessory muscle use.  Cardiovascular: Irregularly irregular, no murmurs / rubs / gallops. No extremity edema. 2+ pedal pulses. Abdomen: Suprapubic tenderness, no masses palpated. No hepatosplenomegaly. Bowel sounds positive.  Musculoskeletal: no clubbing / cyanosis. No joint deformity upper and lower extremities. Good ROM, no contractures. Normal muscle tone.  Skin: no rashes, lesions, ulcers. No induration Neurologic: CN 2-12 grossly intact. Sensation intact. Strength 5/5 in all 4.  Psychiatric: Normal judgment and insight. Alert and oriented to person place, not situation.  Normal mood.   Labs on Admission: I have personally reviewed following labs and imaging studies  CBC: Recent Labs  Lab 04/03/21 1806 04/03/21 1821  WBC 10.3  --   HGB 13.2 14.3  HCT 41.9 42.0  MCV 95.4  --   PLT 291  --     Basic Metabolic Panel: Recent Labs  Lab 04/03/21 1806 04/03/21 1821  NA 138 140  K 3.0* 3.3*  CL 100 101  CO2 28  --   GLUCOSE 100* 99  BUN 5* 6*  CREATININE 0.77 0.70  CALCIUM 8.9  --    GFR: Estimated Creatinine Clearance: 41.4 mL/min (by C-G formula based on SCr of 0.7 mg/dL). Liver Function Tests: Recent Labs  Lab 04/03/21 1806  AST 16  ALT 9  ALKPHOS 59  BILITOT 0.9  PROT 7.4  ALBUMIN 3.5   No results for input(s): LIPASE, AMYLASE in the last 168 hours. No results for input(s): AMMONIA in the last 168 hours. Coagulation Profile: Recent Labs  Lab 04/03/21 1806  INR 2.4*   Cardiac Enzymes: No results for input(s): CKTOTAL, CKMB, CKMBINDEX, TROPONINI in the last 168 hours. BNP (last 3 results) No results for input(s): PROBNP in the last 8760 hours. HbA1C: No results for input(s): HGBA1C in the last 72 hours. CBG: Recent Labs  Lab 04/03/21 1804  GLUCAP 101*   Lipid Profile: No results for input(s): CHOL, HDL, LDLCALC, TRIG,  CHOLHDL, LDLDIRECT in the last 72 hours. Thyroid Function Tests: No results for input(s): TSH, T4TOTAL, FREET4, T3FREE, THYROIDAB in the last 72 hours. Anemia Panel: No results for input(s): VITAMINB12, FOLATE, FERRITIN, TIBC, IRON, RETICCTPCT in the last 72 hours. Urine analysis:    Component Value Date/Time   COLORURINE YELLOW 04/03/2021 1806   APPEARANCEUR HAZY (A) 04/03/2021 1806   LABSPEC 1.006 04/03/2021 1806   PHURINE 7.0 04/03/2021 1806   GLUCOSEU NEGATIVE 04/03/2021 1806   HGBUR NEGATIVE 04/03/2021 1806   BILIRUBINUR NEGATIVE 04/03/2021 1806   KETONESUR NEGATIVE 04/03/2021 1806   PROTEINUR NEGATIVE 04/03/2021 1806   NITRITE POSITIVE (A) 04/03/2021 1806   LEUKOCYTESUR SMALL (A) 04/03/2021 1806    Radiological Exams on Admission: CT HEAD WO CONTRAST  Result Date: 04/03/2021 CLINICAL DATA:  Trauma.  Pain.  Patient on blood thinners. EXAM: CT HEAD WITHOUT CONTRAST CT CERVICAL SPINE WITHOUT CONTRAST TECHNIQUE:  Multidetector CT imaging of the head and cervical spine was performed following the standard protocol without intravenous contrast. Multiplanar CT image reconstructions of the cervical spine were also generated. COMPARISON:  None. FINDINGS: CT HEAD FINDINGS Brain: No evidence of acute infarction, hemorrhage, hydrocephalus, extra-axial collection or mass lesion/mass effect. Vascular: Calcified atherosclerosis in the intracranial carotids. Skull: Normal. Negative for fracture or focal lesion. Sinuses/Orbits: No acute finding. Other: None. CT CERVICAL SPINE FINDINGS Alignment: Normal. Skull base and vertebrae: No acute fracture. No primary bone lesion or focal pathologic process. Soft tissues and spinal canal: No prevertebral fluid or swelling. No visible canal hematoma. Disc levels:  Multilevel degenerative disc disease. Upper chest: No acute abnormalities. Other: No other abnormalities. IMPRESSION: 1. No acute intracranial abnormalities. 2. No cervical spine fracture or traumatic malalignment. Electronically Signed   By: Dorise Bullion III M.D.   On: 04/03/2021 18:53   CT Chest W Contrast  Result Date: 04/03/2021 CLINICAL DATA:  [place], anticoagulated, cough EXAM: CT CHEST WITH CONTRAST TECHNIQUE: Multidetector CT imaging of the chest was performed during intravenous contrast administration. CONTRAST:  54mL OMNIPAQUE IOHEXOL 300 MG/ML  SOLN COMPARISON:  04/03/2021 FINDINGS: Cardiovascular: The heart is enlarged, with prominent biatrial dilatation. No pericardial effusion. No evidence of thoracic aortic aneurysm or dissection. Mild atherosclerosis. Mediastinum/Nodes: Borderline enlarged mediastinal and hilar lymph nodes are identified, measuring up to 11 mm in the precarinal region. These are likely reactive. Thyroid, trachea, and esophagus are unremarkable. Lungs/Pleura: There is diffuse interlobular septal thickening, with scattered bilateral areas of nodular airspace disease. This could reflect early edema or  atypical infection such as viral pneumonia. No effusion or pneumothorax. The central airways are patent. Upper Abdomen: No acute abnormality. Multiple hepatic hypodensities compatible with cysts. Musculoskeletal: No acute or destructive bony lesions. Reconstructed images demonstrate no additional findings. IMPRESSION: 1. Diffuse interlobular septal thickening with patchy areas of airspace disease bilaterally. Viral pneumonia or early edema could give this appearance. 2. Marked cardiomegaly. 3.  Aortic Atherosclerosis (ICD10-I70.0). Electronically Signed   By: Randa Ngo M.D.   On: 04/03/2021 22:39   CT CERVICAL SPINE WO CONTRAST  Result Date: 04/03/2021 CLINICAL DATA:  Trauma.  Pain.  Patient on blood thinners. EXAM: CT HEAD WITHOUT CONTRAST CT CERVICAL SPINE WITHOUT CONTRAST TECHNIQUE: Multidetector CT imaging of the head and cervical spine was performed following the standard protocol without intravenous contrast. Multiplanar CT image reconstructions of the cervical spine were also generated. COMPARISON:  None. FINDINGS: CT HEAD FINDINGS Brain: No evidence of acute infarction, hemorrhage, hydrocephalus, extra-axial collection or mass lesion/mass effect. Vascular: Calcified atherosclerosis in the intracranial carotids.  Skull: Normal. Negative for fracture or focal lesion. Sinuses/Orbits: No acute finding. Other: None. CT CERVICAL SPINE FINDINGS Alignment: Normal. Skull base and vertebrae: No acute fracture. No primary bone lesion or focal pathologic process. Soft tissues and spinal canal: No prevertebral fluid or swelling. No visible canal hematoma. Disc levels:  Multilevel degenerative disc disease. Upper chest: No acute abnormalities. Other: No other abnormalities. IMPRESSION: 1. No acute intracranial abnormalities. 2. No cervical spine fracture or traumatic malalignment. Electronically Signed   By: Dorise Bullion III M.D.   On: 04/03/2021 18:53   DG Pelvis Portable  Result Date:  04/03/2021 CLINICAL DATA:  Fall.  Patient on blood thinners. EXAM: PORTABLE PELVIS 1-2 VIEWS COMPARISON:  None. FINDINGS: There mild symmetric degenerative changes of the hips. There is no evidence of fracture or dislocation. Degenerative change of the spine. IMPRESSION: No acute findings. Electronically Signed   By: Marin Olp M.D.   On: 04/03/2021 18:25   DG Chest Port 1 View  Result Date: 04/03/2021 CLINICAL DATA:  Fall.  Patient on blood thinners. EXAM: PORTABLE CHEST 1 VIEW COMPARISON:  None. FINDINGS: Lungs are adequately inflated and demonstrate bilateral diffuse hazy interstitial prominence which may be acute or chronic. No lobar consolidation or effusion. No pneumothorax. Borderline cardiomegaly. No evidence of fracture. IMPRESSION: 1. No acute fracture. 2. Diffuse hazy interstitial prominence which may be acute or chronic. Acute infection is possible. 3. Borderline cardiomegaly. Electronically Signed   By: Marin Olp M.D.   On: 04/03/2021 18:25   DG Hand Complete Left  Result Date: 04/03/2021 CLINICAL DATA:  Fall.  Patient on blood thinners. EXAM: LEFT HAND - COMPLETE 3+ VIEW COMPARISON:  None. FINDINGS: There mild degenerate changes over the radiocarpal joint, carpal bones and first metacarpal joints. There mild degenerate changes over the MCP joints and interphalangeal joints. Bone alignment and mineralization is normal. There are no bony erosions. No acute fracture or dislocation. IMPRESSION: 1. No acute findings. 2. Degenerative changes as described. Electronically Signed   By: Marin Olp M.D.   On: 04/03/2021 18:27    EKG: Personally reviewed.  Atrial fibrillation, rate 81.  Bradycardia no longer present when compared to prior.  Assessment/Plan Principal Problem:   Acute respiratory failure with hypoxia (HCC) Active Problems:   COPD (chronic obstructive pulmonary disease) (HCC)   Chronic atrial fibrillation (HCC)   Hypokalemia   Hypothyroidism   Hyperlipidemia    Dementia without behavioral disturbance (HCC)   Lindsay Rush is a 86 y.o. female with medical history significant for chronic atrial fibrillation on Xarelto, COPD, ?  CHF, hypothyroidism, hyperlipidemia, dementia who was admitted with acute hypoxic respiratory failure.  Acute respiratory failure with hypoxia: SPO2 89% on room air, currently requiring 2 L O2 via [place] to made SPO2 >92%.  Does not use supplemental oxygen as an outpatient.  CT chest negative for PE, nonspecific changes seen suspicious for early pulmonary edema or viral pneumonia.  Given history, favor pulmonary edema/CHF.  No prior records in our system that indicate whether patient has history of diastolic or systolic CHF or echocardiogram on file. -Start IV Lasix 40 mg twice daily -Monitor strict I/O's and daily weights -Check BNP -Obtain echocardiogram -Continue supplemental oxygen and wean as able -Continue digoxin, check level  Fall at facility: Unclear circumstances around the fall.  No evidence of significant injury on imaging.  Continue to monitor on telemetry, check echo.  PT/OT eval, fall precautions.  Chronic atrial fibrillation: In atrial fibrillation with controlled rate on admission.  Continue Xarelto  and digoxin.  COPD: Appears stable, no wheezing on exam at time of admission.  Continue Trelegy Ellipta and albuterol as needed.  Hypokalemia: Start IV supplement.  Check magnesium and supplement if needed.  Bacteriuria: Patient reports urinary frequency without dysuria.  She does have some suprapubic tenderness on exam.  She received 1 dose IV ceftriaxone while in the ED.  Will obtain urine culture.  Chronic hypotension: Presumed chronic hypotension given patient is on Florinef 0.1 mg daily and midodrine 2.5 mg TIDAC as an outpatient.  Patient has been actually been mildly hypertensive on admission. -Continue Florinef, hold midodrine for now  Hypothyroidism: Continue Armour thyroid  supplement.  Hyperlipidemia: Continue Zetia.  Dementia: Continue delirium precautions, Lexapro.  DVT prophylaxis: Xarelto Code Status: Full code Family Communication: None available on admission Disposition Plan: From ALF, dispo pending clinical progress Consults called: None Level of care: Telemetry Cardiac Admission status:  Status is: Observation  The patient remains OBS appropriate and will d/c before 2 midnights.   Zada Finders MD [facility]  If 7PM-7AM, please contact night-coverage [email or web address]  04/04/2021, 1:00 AM

## 2021-04-03 NOTE — ED Notes (Signed)
C-Collar removed by MacPherson MD. Pt can have fluids per MD as well.

## 2021-04-03 NOTE — ED Notes (Signed)
MacPharson MD removed pt oxygen. Pt dropped back down to 88/89. RN placed pt back on 3L [place].

## 2021-04-03 NOTE — ED Provider Notes (Incomplete)
Patient is presenting today as a level 2 trauma after an unwitnessed fall and patient does take anticoagulation.  Patient lives in assisted living and does have a history of dementia.  It was reported that she was transferring from her bed to a chair when she fell.  This was unwitnessed.  Patient reports she does remember the fall but has hematoma to the back of her head and pain in her left arm and left knee.  She is awake alert and at baseline at this time.  Vital signs are reassuring.  Imaging pending.

## 2021-04-04 ENCOUNTER — Encounter (HOSPITAL_COMMUNITY): Payer: Self-pay | Admitting: Internal Medicine

## 2021-04-04 ENCOUNTER — Inpatient Hospital Stay (HOSPITAL_COMMUNITY): Payer: Medicare Other

## 2021-04-04 DIAGNOSIS — I5033 Acute on chronic diastolic (congestive) heart failure: Secondary | ICD-10-CM | POA: Diagnosis present

## 2021-04-04 DIAGNOSIS — J9601 Acute respiratory failure with hypoxia: Secondary | ICD-10-CM | POA: Diagnosis present

## 2021-04-04 DIAGNOSIS — I700 Atherosclerosis of aorta: Secondary | ICD-10-CM | POA: Diagnosis present

## 2021-04-04 DIAGNOSIS — E039 Hypothyroidism, unspecified: Secondary | ICD-10-CM | POA: Diagnosis present

## 2021-04-04 DIAGNOSIS — Z9181 History of falling: Secondary | ICD-10-CM | POA: Diagnosis not present

## 2021-04-04 DIAGNOSIS — E876 Hypokalemia: Secondary | ICD-10-CM | POA: Diagnosis present

## 2021-04-04 DIAGNOSIS — B961 Klebsiella pneumoniae [K. pneumoniae] as the cause of diseases classified elsewhere: Secondary | ICD-10-CM | POA: Diagnosis present

## 2021-04-04 DIAGNOSIS — Z79899 Other long term (current) drug therapy: Secondary | ICD-10-CM | POA: Diagnosis not present

## 2021-04-04 DIAGNOSIS — F0390 Unspecified dementia without behavioral disturbance: Secondary | ICD-10-CM | POA: Diagnosis present

## 2021-04-04 DIAGNOSIS — Z8673 Personal history of transient ischemic attack (TIA), and cerebral infarction without residual deficits: Secondary | ICD-10-CM | POA: Diagnosis not present

## 2021-04-04 DIAGNOSIS — Z20822 Contact with and (suspected) exposure to covid-19: Secondary | ICD-10-CM | POA: Diagnosis present

## 2021-04-04 DIAGNOSIS — J449 Chronic obstructive pulmonary disease, unspecified: Secondary | ICD-10-CM | POA: Diagnosis present

## 2021-04-04 DIAGNOSIS — G9341 Metabolic encephalopathy: Secondary | ICD-10-CM | POA: Diagnosis present

## 2021-04-04 DIAGNOSIS — I482 Chronic atrial fibrillation, unspecified: Secondary | ICD-10-CM | POA: Diagnosis present

## 2021-04-04 DIAGNOSIS — I110 Hypertensive heart disease with heart failure: Secondary | ICD-10-CM | POA: Diagnosis present

## 2021-04-04 DIAGNOSIS — S0003XA Contusion of scalp, initial encounter: Secondary | ICD-10-CM | POA: Diagnosis present

## 2021-04-04 DIAGNOSIS — E119 Type 2 diabetes mellitus without complications: Secondary | ICD-10-CM | POA: Diagnosis present

## 2021-04-04 DIAGNOSIS — I341 Nonrheumatic mitral (valve) prolapse: Secondary | ICD-10-CM | POA: Diagnosis present

## 2021-04-04 DIAGNOSIS — I4821 Permanent atrial fibrillation: Secondary | ICD-10-CM | POA: Diagnosis present

## 2021-04-04 DIAGNOSIS — Z1611 Resistance to penicillins: Secondary | ICD-10-CM | POA: Diagnosis present

## 2021-04-04 DIAGNOSIS — I361 Nonrheumatic tricuspid (valve) insufficiency: Secondary | ICD-10-CM | POA: Diagnosis not present

## 2021-04-04 DIAGNOSIS — W06XXXA Fall from bed, initial encounter: Secondary | ICD-10-CM | POA: Diagnosis present

## 2021-04-04 DIAGNOSIS — I9589 Other hypotension: Secondary | ICD-10-CM | POA: Diagnosis present

## 2021-04-04 DIAGNOSIS — F03A0 Unspecified dementia, mild, without behavioral disturbance, psychotic disturbance, mood disturbance, and anxiety: Secondary | ICD-10-CM | POA: Diagnosis present

## 2021-04-04 DIAGNOSIS — Z8249 Family history of ischemic heart disease and other diseases of the circulatory system: Secondary | ICD-10-CM | POA: Diagnosis not present

## 2021-04-04 DIAGNOSIS — I340 Nonrheumatic mitral (valve) insufficiency: Secondary | ICD-10-CM | POA: Diagnosis not present

## 2021-04-04 DIAGNOSIS — Z7901 Long term (current) use of anticoagulants: Secondary | ICD-10-CM | POA: Diagnosis not present

## 2021-04-04 DIAGNOSIS — E785 Hyperlipidemia, unspecified: Secondary | ICD-10-CM | POA: Diagnosis present

## 2021-04-04 DIAGNOSIS — N390 Urinary tract infection, site not specified: Secondary | ICD-10-CM | POA: Diagnosis present

## 2021-04-04 LAB — CBC
HCT: 38.6 % (ref 36.0–46.0)
Hemoglobin: 12.4 g/dL (ref 12.0–15.0)
MCH: 30.5 pg (ref 26.0–34.0)
MCHC: 32.1 g/dL (ref 30.0–36.0)
MCV: 95.1 fL (ref 80.0–100.0)
Platelets: 271 10*3/uL (ref 150–400)
RBC: 4.06 MIL/uL (ref 3.87–5.11)
RDW: 14.9 % (ref 11.5–15.5)
WBC: 11.1 10*3/uL — ABNORMAL HIGH (ref 4.0–10.5)
nRBC: 0 % (ref 0.0–0.2)

## 2021-04-04 LAB — BASIC METABOLIC PANEL
Anion gap: 10 (ref 5–15)
BUN: <5 mg/dL — ABNORMAL LOW (ref 8–23)
CO2: 26 mmol/L (ref 22–32)
Calcium: 8.4 mg/dL — ABNORMAL LOW (ref 8.9–10.3)
Chloride: 101 mmol/L (ref 98–111)
Creatinine, Ser: 0.72 mg/dL (ref 0.44–1.00)
GFR, Estimated: >60 mL/min (ref >60)
Glucose, Bld: 125 mg/dL — ABNORMAL HIGH (ref 70–99)
Potassium: 2.8 mmol/L — ABNORMAL LOW (ref 3.5–5.1)
Sodium: 137 mmol/L (ref 135–145)

## 2021-04-04 LAB — PROCALCITONIN: Procalcitonin: <0.1 ng/mL

## 2021-04-04 LAB — DIGOXIN LEVEL: Digoxin Level: 0.7 ng/mL — ABNORMAL LOW (ref 0.8–2.0)

## 2021-04-04 LAB — MAGNESIUM: Magnesium: 1.8 mg/dL (ref 1.7–2.4)

## 2021-04-04 LAB — BRAIN NATRIURETIC PEPTIDE: B Natriuretic Peptide: 279.4 pg/mL — ABNORMAL HIGH (ref 0.0–100.0)

## 2021-04-04 LAB — POTASSIUM: Potassium: 3.3 mmol/L — ABNORMAL LOW (ref 3.5–5.1)

## 2021-04-04 LAB — MRSA NEXT GEN BY PCR, NASAL: MRSA by PCR Next Gen: NOT DETECTED

## 2021-04-04 MED ORDER — ACETAMINOPHEN 650 MG RE SUPP: 650.0000 mg | Freq: Four times a day (QID) | RECTAL | Status: DC | PRN

## 2021-04-04 MED ORDER — EZETIMIBE 10 MG PO TABS
10.0000 mg | ORAL_TABLET | Freq: Every day | ORAL | Status: DC
  Administered 2021-04-04 – 2021-04-07 (×5): 10 mg via ORAL
  Filled 2021-04-04 (×5): qty 1

## 2021-04-04 MED ORDER — ACETAMINOPHEN 325 MG PO TABS
650.0000 mg | ORAL_TABLET | Freq: Four times a day (QID) | ORAL | Status: DC | PRN
  Administered 2021-04-05 – 2021-04-07 (×4): 650 mg via ORAL
  Filled 2021-04-04 (×4): qty 2

## 2021-04-04 MED ORDER — ALBUTEROL SULFATE (2.5 MG/3ML) 0.083% IN NEBU: 2.5000 mg | INHALATION_SOLUTION | RESPIRATORY_TRACT | Status: DC | PRN

## 2021-04-04 MED ORDER — FLUTICASONE FUROATE-VILANTEROL 200-25 MCG/ACT IN AEPB
1.0000 | INHALATION_SPRAY | Freq: Every day | RESPIRATORY_TRACT | Status: DC
  Administered 2021-04-05 – 2021-04-08 (×4): 1 via RESPIRATORY_TRACT
  Filled 2021-04-04: qty 28

## 2021-04-04 MED ORDER — DIGOXIN 125 MCG PO TABS
0.2500 mg | ORAL_TABLET | Freq: Every day | ORAL | Status: DC
  Administered 2021-04-04 – 2021-04-06 (×3): 0.25 mg via ORAL
  Filled 2021-04-04 (×3): qty 2

## 2021-04-04 MED ORDER — SENNOSIDES-DOCUSATE SODIUM 8.6-50 MG PO TABS: 1.0000 | ORAL_TABLET | Freq: Every evening | ORAL | Status: DC | PRN

## 2021-04-04 MED ORDER — MAGNESIUM SULFATE IN D5W 1-5 GM/100ML-% IV SOLN
1.0000 g | Freq: Once | INTRAVENOUS | Status: AC
  Administered 2021-04-04: 1 g via INTRAVENOUS
  Filled 2021-04-04: qty 100

## 2021-04-04 MED ORDER — POTASSIUM CHLORIDE 20 MEQ PO PACK
60.0000 meq | PACK | Freq: Once | ORAL | Status: AC
  Administered 2021-04-04: 60 meq via ORAL
  Filled 2021-04-04: qty 3

## 2021-04-04 MED ORDER — TRAZODONE HCL 50 MG PO TABS
50.0000 mg | ORAL_TABLET | Freq: Every day | ORAL | Status: DC
  Administered 2021-04-04 – 2021-04-07 (×5): 50 mg via ORAL
  Filled 2021-04-04 (×5): qty 1

## 2021-04-04 MED ORDER — POTASSIUM CHLORIDE 10 MEQ/100ML IV SOLN
10.0000 meq | INTRAVENOUS | Status: AC
  Administered 2021-04-04 (×4): 10 meq via INTRAVENOUS
  Filled 2021-04-04 (×4): qty 100

## 2021-04-04 MED ORDER — THYROID 30 MG PO TABS
30.0000 mg | ORAL_TABLET | Freq: Every day | ORAL | Status: DC
  Administered 2021-04-04 – 2021-04-08 (×5): 30 mg via ORAL
  Filled 2021-04-04 (×5): qty 1

## 2021-04-04 MED ORDER — POTASSIUM CHLORIDE CRYS ER 20 MEQ PO TBCR
40.0000 meq | EXTENDED_RELEASE_TABLET | Freq: Once | ORAL | Status: AC
  Administered 2021-04-04: 40 meq via ORAL
  Filled 2021-04-04: qty 2

## 2021-04-04 MED ORDER — ONDANSETRON HCL 4 MG/2ML IJ SOLN: 4.0000 mg | Freq: Four times a day (QID) | INTRAMUSCULAR | Status: DC | PRN

## 2021-04-04 MED ORDER — RIVAROXABAN 15 MG PO TABS
15.0000 mg | ORAL_TABLET | Freq: Every day | ORAL | Status: DC
  Administered 2021-04-04 – 2021-04-08 (×5): 15 mg via ORAL
  Filled 2021-04-04 (×5): qty 1

## 2021-04-04 MED ORDER — FLUDROCORTISONE ACETATE 0.1 MG PO TABS
0.1000 mg | ORAL_TABLET | Freq: Every day | ORAL | Status: DC
  Administered 2021-04-04 – 2021-04-06 (×3): 0.1 mg via ORAL
  Filled 2021-04-04 (×3): qty 1

## 2021-04-04 MED ORDER — FUROSEMIDE 10 MG/ML IJ SOLN
40.0000 mg | Freq: Two times a day (BID) | INTRAMUSCULAR | Status: DC
  Administered 2021-04-04 – 2021-04-06 (×5): 40 mg via INTRAVENOUS
  Filled 2021-04-04 (×5): qty 4

## 2021-04-04 MED ORDER — ONDANSETRON HCL 4 MG PO TABS: 4.0000 mg | ORAL_TABLET | Freq: Four times a day (QID) | ORAL | Status: DC | PRN

## 2021-04-04 MED ORDER — POTASSIUM CHLORIDE 20 MEQ PO PACK
20.0000 meq | PACK | Freq: Every day | ORAL | Status: DC
  Administered 2021-04-05 – 2021-04-08 (×3): 20 meq via ORAL
  Filled 2021-04-04 (×5): qty 1

## 2021-04-04 MED ORDER — UMECLIDINIUM BROMIDE 62.5 MCG/ACT IN AEPB
1.0000 | INHALATION_SPRAY | Freq: Every day | RESPIRATORY_TRACT | Status: DC
  Administered 2021-04-05 – 2021-04-08 (×4): 1 via RESPIRATORY_TRACT
  Filled 2021-04-04: qty 7

## 2021-04-04 MED ORDER — SODIUM CHLORIDE 0.9% FLUSH
3.0000 mL | Freq: Two times a day (BID) | INTRAVENOUS | Status: DC
  Administered 2021-04-04 – 2021-04-08 (×8): 3 mL via INTRAVENOUS

## 2021-04-04 MED ORDER — LIP MEDEX EX OINT
TOPICAL_OINTMENT | CUTANEOUS | Status: DC | PRN
  Filled 2021-04-04: qty 7

## 2021-04-04 MED ORDER — ESCITALOPRAM OXALATE 10 MG PO TABS
5.0000 mg | ORAL_TABLET | Freq: Every day | ORAL | Status: DC
  Administered 2021-04-04 – 2021-04-08 (×5): 5 mg via ORAL
  Filled 2021-04-04 (×5): qty 1

## 2021-04-04 MED ORDER — FLUTICASONE-UMECLIDIN-VILANT 200-62.5-25 MCG/ACT IN AEPB: 1.0000 | INHALATION_SPRAY | Freq: Every day | RESPIRATORY_TRACT | Status: DC

## 2021-04-04 NOTE — Evaluation (Signed)
Physical Therapy Evaluation Patient Details Name: Lindsay Rush MRN: [id number] DOB: Feb 07, 1935 Today's Date: 04/04/2021  History of Present Illness  Lindsay Rush is a 86 y.o. female with medical history significant for chronic atrial fibrillation on Xarelto, COPD, ?  CHF, hypothyroidism, hyperlipidemia, dementia admitted after a fallat her facility.  Clinical Impression  Patient presents with decreased mobility due to generalized weakness and decreased balance with recent fall at her facility.  She has swollen L middle finger and some soreness on her tailbone, but mobilized OOB to [facility] and to recliner with min A with RW.  She is high fall risk and per her daughter had been receiving HHPT since her knee replacement surgery.  Feel return to ALF with resumption of HHPT services indicated when stable for d/c.  PT to continue to follow acutely.        Recommendations for follow up therapy are one component of a multi-disciplinary discharge planning process, led by the attending physician.  Recommendations may be updated based on patient status, additional functional criteria and insurance authorization.  Follow Up Recommendations Home health PT    Assistance Recommended at Discharge Frequent or constant Supervision/Assistance  Functional Status Assessment Patient has had a recent decline in their functional status and/or demonstrates limited ability to make significant improvements in function in a reasonable and predictable amount of time  Equipment Recommendations  None recommended by PT    Recommendations for Other Services       Precautions / Restrictions Precautions Precautions: Fall Restrictions Weight Bearing Restrictions: No      Mobility  Bed Mobility Overal bed mobility: Needs Assistance Bed Mobility: Supine to Sit     Supine to sit: HOB elevated;Min assist     General bed mobility comments: increased time, assist for lines and to finish lifting trunk; assist for  scooting out to EOB    Transfers Overall transfer level: Needs assistance Equipment used: Rolling walker (2 wheels) Transfers: Sit to/from Stand;Bed to chair/wheelchair/BSC Sit to Stand: Min assist   Step pivot transfers: Min assist       General transfer comment: increased time to rise, assist for balance and cues for hand placement, stepping to [facility] then to recliner with assist for lines as well and slow progression    Ambulation/Gait                  Stairs            Wheelchair Mobility    Modified Rankin (Stroke Patients Only)       Balance Overall balance assessment: Needs assistance;History of Falls Sitting-balance support: Feet supported Sitting balance-Leahy Scale: Fair Sitting balance - Comments: staying close for safety due to recent fall from bed   Standing balance support: Bilateral upper extremity supported Standing balance-Leahy Scale: Poor Standing balance comment: UE support for balance and minguard A; able to lift for toilet hygiene holding armrest on BSC and min A                             Pertinent Vitals/Pain Pain Assessment: Faces Faces Pain Scale: Hurts even more Pain Location: L middle finger and tailbone Pain Descriptors / Indicators: Aching;Grimacing Pain Intervention(s): Monitored during session;Repositioned    Home Living Family/patient expects to be discharged to:: Assisted living ([facility])                 Home Equipment: Agricultural consultant (2 wheels);Wheelchair - manual  Prior Function Prior Level of Function : Needs assist             Mobility Comments: reports she really likes getting around in a wheelchair ADLs Comments: likely assist for most     Hand Dominance        Extremity/Trunk Assessment   Upper Extremity Assessment Upper Extremity Assessment: Generalized weakness    Lower Extremity Assessment Lower Extremity Assessment: Generalized weakness    Cervical / Trunk  Assessment Cervical / Trunk Assessment: Kyphotic (slight kyphosis)  Communication   Communication: No difficulties  Cognition Arousal/Alertness: Awake/alert Behavior During Therapy: WFL for tasks assessed/performed Overall Cognitive Status: No family/caregiver present to determine baseline cognitive functioning Area of Impairment: Orientation;Memory;Following commands;Safety/judgement;Problem solving                 Orientation Level: Disoriented to;Place;Time;Situation   Memory: Decreased short-term memory Following Commands: Follows one step commands with increased time;Follows one step commands consistently Safety/Judgement: Decreased awareness of deficits   Problem Solving: Slow processing;Decreased initiation;Requires verbal cues;Requires tactile cues General Comments: thought it was  10pm, did not remember where she lived till reminded        General Comments General comments (skin integrity, edema, etc.): SpO2 88-91% on RA, Patient wearing O2 on her forehead so placed on 1L O2 at rest    Exercises     Assessment/Plan    PT Assessment Patient needs continued PT services  PT Problem List Decreased strength;Decreased mobility;Decreased safety awareness;Decreased balance;Decreased knowledge of use of DME;Decreased activity tolerance;Pain       PT Treatment Interventions Therapeutic activities;DME instruction;Patient/family education;Therapeutic exercise;Gait training;Balance training;Functional mobility training;Wheelchair mobility training    PT Goals (Current goals can be found in the Care Plan section)  Acute Rehab PT Goals Patient Stated Goal: To return to ALF with HHPT PT Goal Formulation: With patient/family Time For Goal Achievement: 04/18/21 Potential to Achieve Goals: Good    Frequency Min 2X/week   Barriers to discharge        Co-evaluation               AM-PAC PT "6 Clicks" Mobility  Outcome Measure Help needed turning from your back to  your side while in a flat bed without using bedrails?: A Lot Help needed moving from lying on your back to sitting on the side of a flat bed without using bedrails?: A Lot Help needed moving to and from a bed to a chair (including a wheelchair)?: A Little Help needed standing up from a chair using your arms (e.g., wheelchair or bedside chair)?: A Little Help needed to walk in hospital room?: Total Help needed climbing 3-5 steps with a railing? : Total 6 Click Score: 12    End of Session Equipment Utilized During Treatment: Oxygen Activity Tolerance: Patient limited by fatigue Patient left: in chair;with call bell/phone within reach;with chair alarm set   PT Visit Diagnosis: History of falling (Z91.81);Other abnormalities of gait and mobility (R26.89);Pain Pain - Right/Left: Left Pain - part of body: Hand    Time: 1005-1045 PT Time Calculation (min) (ACUTE ONLY): 40 min   Charges:   PT Evaluation $PT Eval Moderate Complexity: 1 Mod PT Treatments $Therapeutic Activity: 8-22 mins $Self Care/Home Management: 8-22        Magda Kiel, PT Acute Rehabilitation Services [phone] [phone] 04/04/2021   Reginia Naas 04/04/2021, 11:04 AM

## 2021-04-04 NOTE — Progress Notes (Signed)
SATURATION QUALIFICATIONS: (This note is used to comply with regulatory documentation for home oxygen)  Patient Saturations on Room Air at Rest = 88%  Patient Saturations on Room Air while Ambulating = 84%  Patient Saturations on 1 Liters of oxygen while Ambulating = 93%  Please briefly explain why patient needs home oxygen: Pt requires supplemental 02 to maintain sp02 >92% while performing ADLs and functional mobility.    Eber Jones., OTR/L Acute Rehabilitation Services Pager [phone] Office [phone]

## 2021-04-04 NOTE — TOC Initial Note (Signed)
Transition of Care [facility]) - Initial/Assessment Note    Patient Details  Name: Lindsay Rush MRN: [id number] Date of Birth: 05-15-1934  Transition of Care [facility]) CM/SW Contact:    Lockie Pares, RN Phone Number: 04/04/2021, 1:15 PM  Clinical Narrative:                  Patient presented with fall from Assisted living [facility]. There were several accounts of what happened. She is currently on oxygen. She does not normally have oxygen at AL. Oxygen qualifications reveal that she needs o2 at [facility] assessment revealed recommendation for PT. Attempted to call patient in room, no answer. Called [facility]  to inquire about services there., Left message for them to call back this RNCM.    Expected Discharge Plan: Assisted Living Barriers to Discharge: Continued Medical Work up   Patient Goals and CMS Choice  AL with HH and oxygen      Expected Discharge Plan and Services Expected Discharge Plan: Assisted Living     Post Acute Care Choice: Home Health Living arrangements for the past 2 months: Assisted Living Facility                           HH Arranged: PT          Prior Living Arrangements/Services Living arrangements for the past 2 months: Assisted Living Facility Lives with:: Self Patient language and need for interpreter reviewed:: Yes        Need for Family Participation in Patient Care: Yes (Comment) Care giver support system in place?: Yes (comment)   Criminal Activity/Legal Involvement Pertinent to Current Situation/Hospitalization: No - Comment as needed  Activities of Daily Living Home Assistive Devices/Equipment: Environmental consultant (specify type), Wheelchair, Grab bars in shower ADL Screening (condition at time of admission) Patient's cognitive ability adequate to safely complete daily activities?: Yes Is the patient deaf or have difficulty hearing?: No Does the patient have difficulty seeing, even when wearing glasses/contacts?: No Does the patient  have difficulty concentrating, remembering, or making decisions?: No Patient able to express need for assistance with ADLs?: Yes Does the patient have difficulty dressing or bathing?: No Independently performs ADLs?: Yes (appropriate for developmental age) Does the patient have difficulty walking or climbing stairs?: Yes Weakness of Legs: Both Weakness of Arms/Hands: None  Permission Sought/Granted                  Emotional Assessment       Orientation: : Fluctuating Orientation (Suspected and/or reported Sundowners) Alcohol / Substance Use: Not Applicable Psych Involvement: No (comment)  Admission diagnosis:  Trauma [T14.90XA] Hypoxia [R09.02] Acute respiratory failure with hypoxia (HCC) [J96.01] Dyspnea, unspecified type [R06.00] Acute respiratory failure with hypoxemia (HCC) [J96.01] Patient Active Problem List   Diagnosis Date Noted   Acute respiratory failure with hypoxemia (HCC) 04/04/2021   COPD (chronic obstructive pulmonary disease) (HCC)    Chronic atrial fibrillation (HCC)    Hypokalemia    Hypothyroidism    Hyperlipidemia    Dementia without behavioral disturbance (HCC)    Acute respiratory failure with hypoxia (HCC) 04/03/2021   PCP:  Joycelyn Man, NP Pharmacy:  No Pharmacies Listed    Social Determinants of Health (SDOH) Interventions    Readmission Risk Interventions No flowsheet data found.

## 2021-04-04 NOTE — Plan of Care (Signed)
  Problem: Pain Managment: Goal: General experience of comfort will improve Outcome: Progressing   

## 2021-04-04 NOTE — Progress Notes (Signed)
PROGRESS NOTE  Lindsay Rush  DOB: April 24, 1935  PCP: Joycelyn Man, NP TGP:[id number]  DOA: 04/03/2021  LOS: 0 days  Hospital Day: 2  Chief Complaint  Patient presents with   Fall    Brief narrative: Lindsay Rush is a 86 y.o. female with PMH significant for dementia, HTN, HLD, A. fib on Xarelto, COPD, hypothyroidism who lives at [facility] assisted living. Patient was brought to the ED on 11/24 for a fall.  Patient reports that while getting out of her room to the street, she heard a sudden loud yelling from the resident nearby which led her to start to jump out to the street.  She fell on the street but did not pass out.  Patient started having pain and tenderness in the back of her head and also left knee and left hand pain and was hence brought to the ED.  In the ED, patient was afebrile, blood pressure elevated to 160/80, pulse 90, O2 sat 89% on room air and required supplemental oxygen. Labs with hemoglobin 13.2, platelet 291, lactic acid 1.9 SARS-CoV-2 and influenza PCR negative.   Urinalysis showed positive nitrates, small leukocytes, 0-5 RBC/hpf, 11-20 WBC/hpf, many bacteria on microscopy. Chest x-ray showed diffuse hazy interstitial prominence which may be acute or chronic, borderline cardiomegaly, no acute fracture. Pelvic x-rays negative for acute findings. Left hand x-ray negative for acute findings.  Mild degenerative changes noted. CT head without contrast negative for acute intracranial normalities. CT cervical spine without contrast negative for cervical spine fracture or traumatic malalignment. CT chest with contrast showed diffuse interlobular septal thickening with patchy areas of airspace disease bilaterally, marked cardiomegaly, aortic atherosclerosis.   Patient was given IV ceftriaxone and azithromycin.  The hospitalist service was consulted to admit for further evaluation and management of acute hypoxic respiratory failure.  Subjective: Patient was  seen and examined this morning.  Pleasant elderly Caucasian female.  Sitting up in chair.  On 2 L oxygen nasal cannula.  Not in distress at this time. Chart reviewed  Potassium level significantly low at 2.8 this morning.  Assessment/Plan: Fall at the facility -Reportedly fell out of bed to the floor without loss of consciousness -No significant external or bone injury in a skeletal survey -Continue monitoring telemetry, pending echocardiogram -PT OT, fall precautions  Acute respiratory failure with hypoxia COPD -Not on supplemental oxygen at home.   -In the ED, O2 sat was low at 89% and required supplemental oxygen.  No wheezing. -CT chest negative for PE, nonspecific changes seen suspicious for early pulmonary edema or viral pneumonia.  No fever.  WBC count, lactic acid level normal.  Patient received 1 dose of antibiotics in the ED.  It was not continued on admission.  I agree with observing off antibiotics at this time.   -Continue bronchodilators. -Pending echocardiogram -Check oxygen level on ambulation   Chronic atrial fibrillation -Rate controlled on admission.  Continue digoxin and Xarelto.  -Monitoring telemetry monitoring with concern of digoxin toxicity due to hypokalemia  Hypokalemia -Potassium level significantly low at 2.8 this morning.  IV and oral replacement given. -Repeat potassium level tomorrow. Recent Labs  Lab 04/03/21 1806 04/03/21 1821 04/04/21 0125  K 3.0* 3.3* 2.8*  MG  --   --  1.8   Bacteriuria -Patient reports urinary frequency without dysuria.  She does have some suprapubic tenderness on exam. She received 1 dose IV ceftriaxone while in the ED. pending urine culture   Chronic hypotension -Home meds include Florinef 0.1 mg daily and  midodrine 2.5 mg TIDAC.  -Patient actually has hypertension in the hospital.  Florinef continued.  Midodrine on hold.  Continue to monitor blood pressure.   Hypothyroidism -Continue Armour thyroid supplement.    Hyperlipidemia -Continue Zetia.   Dementia -Continue delirium precautions, Lexapro.  Mobility: PT eval Living condition: Lives in ALF Goals of care:   Code Status: Full Code  Nutritional status: Body mass index is 22.02 kg/m.     Diet:  Diet Order             Diet Heart Room service appropriate? Yes; Fluid consistency: Thin  Diet effective now                  DVT prophylaxis:   Rivaroxaban (XARELTO) tablet 15 mg   Antimicrobials: None Fluid: None Consultants: None Family Communication: None at bedside  Status is: Observation  Remains inpatient appropriate because: Pending echocardiogram.  Potassium level severely low which is significant in the setting of digoxin use  Dispo: The patient is from: ALF              Anticipated d/c is to: Back to ALF hopefully tomorrow.              Patient currently is not medically stable to d/c.   Difficult to place patient No     Infusions:    Scheduled Meds:  digoxin  0.25 mg Oral Daily   escitalopram  5 mg Oral Daily   ezetimibe  10 mg Oral QHS   fludrocortisone  0.1 mg Oral Daily   fluticasone furoate-vilanterol  1 puff Inhalation Daily   And   umeclidinium bromide  1 puff Inhalation Daily   furosemide  40 mg Intravenous Q12H   potassium chloride  20 mEq Oral Daily   Rivaroxaban  15 mg Oral Daily   sodium chloride flush  3 mL Intravenous Q12H   thyroid  30 mg Oral QAC breakfast   traZODone  50 mg Oral QHS    PRN meds: acetaminophen **OR** acetaminophen, albuterol, lip balm, ondansetron **OR** ondansetron (ZOFRAN) IV, senna-docusate   Antimicrobials: Anti-infectives (From admission, onward)    Start     Dose/Rate Route Frequency Ordered Stop   04/03/21 2300  cefTRIAXone (ROCEPHIN) 1 g in sodium chloride 0.9 % 100 mL IVPB        1 g 200 mL/hr over 30 Minutes Intravenous  Once 04/03/21 2255 04/04/21 0003   04/03/21 2300  azithromycin (ZITHROMAX) 500 mg in sodium chloride 0.9 % 250 mL IVPB        500  mg 250 mL/hr over 60 Minutes Intravenous  Once 04/03/21 2255 04/04/21 0107       Objective: Vitals:   04/04/21 0937 04/04/21 1106  BP:  123/66  Pulse: 91 80  Resp:  (!) 23  Temp:  97.8 F (36.6 C)  SpO2:  94%    Intake/Output Summary (Last 24 hours) at 04/04/2021 1141 Last data filed at 04/04/2021 0742 Gross per 24 hour  Intake 1124.67 ml  Output 4400 ml  Net -3275.33 ml   Filed Weights   04/03/21 1815 04/04/21 0500  Weight: 59 kg 54.6 kg   Weight change:  Body mass index is 22.02 kg/m.   Physical Exam: General exam: Pleasant, elderly Caucasian female.  Sitting up in chair.  Not in distress Skin: No rashes, lesions or ulcers. HEENT: Atraumatic, normocephalic, no obvious bleeding Lungs: Clear to auscultation bilaterally CVS: Regular rate and rhythm, no murmur GI/Abd soft, nontender, nondistended, bowel  sound present CNS: Alert, awake, oriented x3 Psychiatry: Mood appropriate Extremities: No pedal edema, no calf tenderness  Data Review: I have personally reviewed the laboratory data and studies available.  F/u labs ordered Unresulted Labs (From admission, onward)     Start     Ordered   04/05/21 0500  CBC with Differential/Platelet  Daily,   R      04/04/21 0820   04/05/21 0500  Basic metabolic panel  Daily,   R      04/04/21 0820   04/04/21 1200  Potassium  Once,   R        04/04/21 0820   04/04/21 0012  Urine Culture  Add-on,   AD       Question:  Indication  Answer:  Dysuria   04/04/21 0012            Signed, Lorin Glass, MD [facility] 04/04/2021

## 2021-04-04 NOTE — Progress Notes (Signed)
Patient is refusing echo at this time

## 2021-04-04 NOTE — Progress Notes (Signed)
   04/04/21 0318  Assess: MEWS Score  Temp 97.6 F (36.4 C)  BP (!) 178/84  Pulse Rate 89  ECG Heart Rate 92  Resp (!) 30  Level of Consciousness Alert  SpO2 91 %  O2 Device Nasal Cannula  O2 Flow Rate (L/min) 2 L/min  Assess: MEWS Score  MEWS Temp 0  MEWS Systolic 0  MEWS Pulse 0  MEWS RR 2  MEWS LOC 0  MEWS Score 2  MEWS Score Color Yellow  Assess: if the MEWS score is Yellow or Red  Were vital signs taken at a resting state? Yes  Focused Assessment Change from prior assessment (see assessment flowsheet)  Early Detection of Sepsis Score *See Row Information* Medium  Treat  MEWS Interventions Administered scheduled meds/treatments  Pain Scale 0-10  Pain Score 0  Take Vital Signs  Increase Vital Sign Frequency  Yellow: Q 2hr X 2 then Q 4hr X 2, if remains yellow, continue Q 4hrs  Escalate  MEWS: Escalate Yellow: discuss with charge nurse/RN and consider discussing with provider and RRT  Notify: Charge Nurse/RN  Name of Charge Nurse/RN Notified Talbert Forest  Date Charge Nurse/RN Notified 04/04/21  Time Charge Nurse/RN Notified 0330  Notify: Provider  Provider Name/Title NA  Notify: Rapid Response  Name of Rapid Response RN Notified NA  Patient has intermittent short rapid breaths since ED, O2 at 2L [place] , sats above 90 %, patient not in distress or SOB, CP will continue to monitor

## 2021-04-04 NOTE — Plan of Care (Signed)
?  Problem: Clinical Measurements: ?Goal: Will remain free from infection ?Outcome: Progressing ?  ?

## 2021-04-04 NOTE — Evaluation (Signed)
Occupational Therapy Evaluation Patient Details Name: Lindsay Rush MRN: [id number] DOB: 1934/09/22 Today's Date: 04/04/2021   History of Present Illness Lindsay Rush is a 86 y.o. female with medical history significant for chronic atrial fibrillation on Xarelto, COPD, ?  CHF, hypothyroidism, hyperlipidemia, dementia admitted after a fall at her facility.   Clinical Impression   Pt admitted with above. She demonstrates the below listed deficits and will benefit from continued OT to maximize safety and independence with BADLs.  Pt presents to OT with generalized weakness, decreased activity tolerance, impaired balance, impaired cognition.  She currently requires set up - min A for ADLs, and min A for functional mobility.  She resides at [facility], and reports she was able to complete ADLs without assist, but no family present to confirm this info.  Will follow acutely.        Recommendations for follow up therapy are one component of a multi-disciplinary discharge planning process, led by the attending physician.  Recommendations may be updated based on patient status, additional functional criteria and insurance authorization.   Follow Up Recommendations  Home health OT    Assistance Recommended at Discharge Intermittent Supervision/Assistance  Functional Status Assessment  Patient has had a recent decline in their functional status and demonstrates the ability to make significant improvements in function in a reasonable and predictable amount of time.  Equipment Recommendations  None recommended by OT    Recommendations for Other Services       Precautions / Restrictions Precautions Precautions: Fall      Mobility Bed Mobility Overal bed mobility: Needs Assistance Bed Mobility: Supine to Sit;Sit to Supine     Supine to sit: Min guard Sit to supine: Mod assist   General bed mobility comments: assist for lifting LEs onto bed and mod cues for sequencing     Transfers                          Balance Overall balance assessment: Needs assistance;History of Falls Sitting-balance support: Feet supported Sitting balance-Leahy Scale: Good     Standing balance support: Bilateral upper extremity supported                               ADL either performed or assessed with clinical judgement   ADL Overall ADL's : Needs assistance/impaired Eating/Feeding: Independent   Grooming: Wash/dry hands;Wash/dry face;Oral care;Brushing hair;Min guard;Standing   Upper Body Bathing: Supervision/ safety;Sitting   Lower Body Bathing: Minimal assistance;Sit to/from stand   Upper Body Dressing : Minimal assistance;Sitting   Lower Body Dressing: Minimal assistance;Sit to/from stand   Toilet Transfer: Minimal assistance;Ambulation;Comfort height toilet;BSC/3in1;Rolling walker (2 wheels)   Toileting- Clothing Manipulation and Hygiene: Minimal assistance;Sit to/from stand       Functional mobility during ADLs: Minimal assistance;Rolling walker (2 wheels)       Vision Patient Visual Report: No change from baseline       Perception     Praxis      Pertinent Vitals/Pain Pain Assessment: Faces Faces Pain Scale: Hurts even more Pain Location: L middle finger and tailbone Pain Descriptors / Indicators: Aching;Grimacing Pain Intervention(s): Monitored during session     Hand Dominance     Extremity/Trunk Assessment Upper Extremity Assessment Upper Extremity Assessment: Generalized weakness;LUE deficits/detail LUE Deficits / Details: middle finger PIP joint swollen.  Pt reports this is secondary to a fall.  She demosntrates ~75% active PIP flexion  of that finger   Lower Extremity Assessment Lower Extremity Assessment: Defer to PT evaluation   Cervical / Trunk Assessment Cervical / Trunk Assessment: Kyphotic   Communication Communication Communication: No difficulties   Cognition Arousal/Alertness:  Awake/alert Behavior During Therapy: WFL for tasks assessed/performed Overall Cognitive Status: No family/caregiver present to determine baseline cognitive functioning Area of Impairment: Orientation;Attention;Memory;Following commands;Problem solving                 Orientation Level: Disoriented to;Time Current Attention Level: Selective Memory: Decreased short-term memory Following Commands: Follows one step commands consistently;Follows multi-step commands inconsistently Safety/Judgement: Decreased awareness of safety;Decreased awareness of deficits   Problem Solving: Slow processing;Difficulty sequencing;Requires verbal cues;Requires tactile cues General Comments: Pt reports it's 2003.  She doesn't know what city she is in, nor how long she has lived in [place] nor why she moved to [place].  She required mod cues to sequence how to return to supine.  Pt with h/o dementia     General Comments  sp02 84-88% on RA; 93% on 1L 02    Exercises     Shoulder Instructions      Home Living Family/patient expects to be discharged to:: Assisted living                             Home Equipment: Rolling Walker (2 wheels);Wheelchair - manual          Prior Functioning/Environment Prior Level of Function : Needs assist             Mobility Comments: reports she really likes getting around in a wheelchair ADLs Comments: Pt reports she performs ADLs without assist, but no family present to confirm info        OT Problem List: Decreased strength;Decreased cognition;Decreased safety awareness;Decreased knowledge of use of DME or AE;Pain;Decreased activity tolerance      OT Treatment/Interventions: Self-care/ADL training;Therapeutic exercise;Therapeutic activities;Cognitive remediation/compensation;Patient/family education;Balance training    OT Goals(Current goals can be found in the care plan section) Acute Rehab OT Goals Patient Stated Goal: to leave the hospital OT  Goal Formulation: With patient Time For Goal Achievement: 04/18/21 Potential to Achieve Goals: Good ADL Goals Pt Will Perform Grooming: with supervision;standing Pt Will Perform Lower Body Bathing: with supervision;sit to/from stand Pt Will Perform Upper Body Dressing: with supervision;sitting Pt Will Perform Lower Body Dressing: with supervision;sit to/from stand Pt Will Transfer to Toilet: with supervision;ambulating;stand pivot transfer;bedside commode;grab bars Pt Will Perform Toileting - Clothing Manipulation and hygiene: with supervision;sit to/from stand  OT Frequency: Min 2X/week   Barriers to D/C:            Co-evaluation              AM-PAC OT "6 Clicks" Daily Activity     Outcome Measure Help from another person eating meals?: None Help from another person taking care of personal grooming?: A Little Help from another person toileting, which includes using toliet, bedpan, or urinal?: A Little Help from another person bathing (including washing, rinsing, drying)?: A Little Help from another person to put on and taking off regular upper body clothing?: A Little Help from another person to put on and taking off regular lower body clothing?: A Little 6 Click Score: 19   End of Session Equipment Utilized During Treatment: Rolling walker (2 wheels);Oxygen Nurse Communication: Mobility status  Activity Tolerance: Patient tolerated treatment well Patient left: in bed;with call bell/phone within reach;with bed alarm set  OT Visit Diagnosis: Unsteadiness on feet (R26.81);Cognitive communication deficit (R41.841)                Time: 7867-6720 OT Time Calculation (min): 27 min Charges:  OT General Charges $OT Visit: 1 Visit OT Evaluation $OT Eval Moderate Complexity: 1 Mod OT Treatments $Therapeutic Activity: 8-22 mins  Eber Jones., OTR/L Acute Rehabilitation Services Pager [phone] Office [phone]   Lindsay Rush 04/04/2021, 4:01 PM

## 2021-04-04 NOTE — Progress Notes (Signed)
SATURATION QUALIFICATIONS: (This note is used to comply with regulatory documentation for home oxygen)  Patient Saturations on Room Air at Rest = 95%  Patient Saturations on Room Air while Ambulating = 88%  Patient Saturations on 2 Liters of oxygen while Ambulating = 92%  Please briefly explain why patient needs home oxygen: Pt desats into the 80s with minimal exertion

## 2021-04-04 NOTE — Plan of Care (Signed)
  Problem: Education: Goal: Knowledge of General Education information will improve Description: Including pain rating scale, medication(s)/side effects and non-pharmacologic comfort measures Outcome: Progressing   Problem: Health Behavior/Discharge Planning: Goal: Ability to manage health-related needs will improve Outcome: Progressing   Problem: Clinical Measurements: Goal: Ability to maintain clinical measurements within normal limits will improve Outcome: Progressing Goal: Will remain free from infection Outcome: Progressing Goal: Diagnostic test results will improve Outcome: Progressing Goal: Respiratory complications will improve Outcome: Progressing Goal: Cardiovascular complication will be avoided Outcome: Progressing   Problem: Activity: Goal: Risk for activity intolerance will decrease Outcome: Progressing   Problem: Nutrition: Goal: Adequate nutrition will be maintained Outcome: Progressing   Problem: Coping: Goal: Level of anxiety will decrease Outcome: Progressing   Problem: Elimination: Goal: Will not experience complications related to bowel motility Outcome: Progressing Goal: Will not experience complications related to urinary retention Outcome: Progressing   Problem: Pain Managment: Goal: General experience of comfort will improve Outcome: Progressing   Problem: Safety: Goal: Ability to remain free from injury will improve Outcome: Progressing   Problem: Skin Integrity: Goal: Risk for impaired skin integrity will decrease Outcome: Progressing   Problem: Education: Goal: Ability to demonstrate management of disease process will improve Outcome: Progressing Goal: Ability to verbalize understanding of medication therapies will improve Outcome: Progressing Goal: Individualized Educational Video(s) Outcome: Progressing   Problem: Activity: Goal: Capacity to carry out activities will improve Outcome: Progressing   Problem: Cardiac: Goal:  Ability to achieve and maintain adequate cardiopulmonary perfusion will improve Outcome: Progressing   Problem: Education: Goal: Ability to demonstrate management of disease process will improve Outcome: Progressing Goal: Ability to verbalize understanding of medication therapies will improve Outcome: Progressing Goal: Individualized Educational Video(s) Outcome: Progressing   Problem: Activity: Goal: Capacity to carry out activities will improve Outcome: Progressing   Problem: Cardiac: Goal: Ability to achieve and maintain adequate cardiopulmonary perfusion will improve Outcome: Progressing   

## 2021-04-05 ENCOUNTER — Inpatient Hospital Stay (HOSPITAL_COMMUNITY): Payer: Medicare Other

## 2021-04-05 DIAGNOSIS — J9601 Acute respiratory failure with hypoxia: Secondary | ICD-10-CM | POA: Diagnosis not present

## 2021-04-05 LAB — CBC WITH DIFFERENTIAL/PLATELET
Abs Immature Granulocytes: 0.06 10*3/uL (ref 0.00–0.07)
Basophils Absolute: 0.1 10*3/uL (ref 0.0–0.1)
Basophils Relative: 1 %
Eosinophils Absolute: 0.1 10*3/uL (ref 0.0–0.5)
Eosinophils Relative: 1 %
HCT: 39.7 % (ref 36.0–46.0)
Hemoglobin: 13 g/dL (ref 12.0–15.0)
Immature Granulocytes: 1 %
Lymphocytes Relative: 23 %
Lymphs Abs: 2.5 10*3/uL (ref 0.7–4.0)
MCH: 30.7 pg (ref 26.0–34.0)
MCHC: 32.7 g/dL (ref 30.0–36.0)
MCV: 93.6 fL (ref 80.0–100.0)
Monocytes Absolute: 1.1 10*3/uL — ABNORMAL HIGH (ref 0.1–1.0)
Monocytes Relative: 10 %
Neutro Abs: 7.1 10*3/uL (ref 1.7–7.7)
Neutrophils Relative %: 64 %
Platelets: 277 10*3/uL (ref 150–400)
RBC: 4.24 MIL/uL (ref 3.87–5.11)
RDW: 14.8 % (ref 11.5–15.5)
WBC: 10.9 10*3/uL — ABNORMAL HIGH (ref 4.0–10.5)
nRBC: 0 % (ref 0.0–0.2)

## 2021-04-05 LAB — BASIC METABOLIC PANEL
Anion gap: 10 (ref 5–15)
BUN: 6 mg/dL — ABNORMAL LOW (ref 8–23)
CO2: 28 mmol/L (ref 22–32)
Calcium: 8.9 mg/dL (ref 8.9–10.3)
Chloride: 97 mmol/L — ABNORMAL LOW (ref 98–111)
Creatinine, Ser: 0.76 mg/dL (ref 0.44–1.00)
GFR, Estimated: >60 mL/min (ref >60)
Glucose, Bld: 119 mg/dL — ABNORMAL HIGH (ref 70–99)
Potassium: 3.4 mmol/L — ABNORMAL LOW (ref 3.5–5.1)
Sodium: 135 mmol/L (ref 135–145)

## 2021-04-05 MED ORDER — SODIUM CHLORIDE 0.9 % IV SOLN
1.0000 g | INTRAVENOUS | Status: DC
  Administered 2021-04-05 – 2021-04-06 (×2): 1 g via INTRAVENOUS
  Filled 2021-04-05 (×2): qty 10

## 2021-04-05 NOTE — TOC CAGE-AID Note (Signed)
Transition of Care [facility]) - CAGE-AID Screening   Patient Details  Name: Lindsay Rush MRN: [id number] Date of Birth: [birth year]  Transition of Care [facility]) CM/SW Contact:    Katha Hamming, RN Phone Number:[phone] 04/05/2021, 3:43 AM    CAGE-AID Screening:    Have You Ever Felt You Ought to Cut Down on Your Drinking or Drug Use?: No Have People Annoyed You By Critizing Your Drinking Or Drug Use?: No Have You Felt Bad Or Guilty About Your Drinking Or Drug Use?: No Have You Ever Had a Drink or Used Drugs First Thing In The Morning to Steady Your Nerves or to Get Rid of a Hangover?: No CAGE-AID Score: 0  Substance Abuse Education Offered: No

## 2021-04-05 NOTE — Significant Event (Addendum)
Rapid Response Event Note   Reason for Call :  Change in LOC  Initial Focused Assessment:  I was called to come and evaluate this patient for she has had an acute neuro change. According to the RN who has her this is a dramatic change in how she was acting yesterday and even this morning. She was constantly trying to exit the bed yesterday, stating that she has to go places. When I examined her it took multiple prompts for her to tell me her name.   She is alert and follows commands for me. She did have some degree of ataxia  with her lower extremities on my exam. She did have a slight drift in her right upper extremity. She is very weak in general. CN II-XII intact. She is not aphasic or dysarthric, but there is a delay in her speech pattern. She does not display any LVO symptoms.   NIH 3 BP 113/60 HR 76 in afib RR 28  Plan of Care:  Patient will get a stat head CT.    Event Summary:   MD Notified: Dr. Margo Aye Call Time: 1030 Arrival Time: 1030 End Time: 1115  Andrey Spearman, RN

## 2021-04-05 NOTE — Progress Notes (Signed)
At approximately 10:19 am RN was called into pt's room by NT to assist with turning pt for pt's bath. PT was more lethargic than she had been earlier in the am and the day shift before. Rapid response called and came to bedside, RN obtained full set of vitals. PT answered questions appropriately but with delayed responses and opened eyes when told, but also with a delay. MD ordered stat head CT. RN continuing to monitor.  When pt returned from CT scan she still seemed a little drowsy and slow to respond to  questions. Over time, (approximately within an hour of returning), she became alert and easily answered questions, with no more delay. RN continuing to monitor

## 2021-04-05 NOTE — Progress Notes (Signed)
PROGRESS NOTE  Lindsay Rush  DOB: [birth year]  PCP: Joycelyn Man, NP [id number]  DOA: 04/03/2021  LOS: 1 day  Hospital Day: 3  Chief Complaint  Patient presents with   Fall    Brief narrative: Lindsay Rush is a 86 y.o. female with PMH significant for dementia, HTN, HLD, chronic A. fib on Xarelto, COPD, hypothyroidism who lives at [facility] assisted [facility]. Patient was brought to the ED on 11/24 for a fall.  Which led to tenderness in the back of her head, left knee, and left hand pain.  She was subsequently brought to the ED for further evaluation.    CT head without contrast negative for acute intracranial normalities. CT cervical spine without contrast negative for cervical spine fracture or traumatic malalignment. CT chest with contrast showed diffuse interlobular septal thickening with patchy areas of airspace disease bilaterally, marked cardiomegaly, aortic atherosclerosis.   Patient was given IV ceftriaxone and azithromycin.  The hospitalist service was consulted to admit for further evaluation and management of acute hypoxic respiratory failure.  04/05/2021: Patient was seen and examined at bedside.  At the time of this visit, she reported persistent nonproductive cough.    Later this morning she was slow at answering questions with concern for possible stroke.  She was sent down for CT head which was unremarkable for any acute findings.  To note she is on Xarelto for chronic A. fib.   Assessment/Plan: Fall at the facility -Reportedly fell out of bed to the floor without loss of consciousness -No significant external or bone injury in a skeletal survey -Continue monitoring telemetry, pending echocardiogram -PT OT, fall precautions  Acute respiratory failure with hypoxia COPD -Not on supplemental oxygen at home.   -In the ED, O2 sat was low at 89% and required supplemental oxygen.  No wheezing. -CT chest negative for PE, nonspecific changes seen  suspicious for early pulmonary edema or viral pneumonia.  No fever.  WBC count, lactic acid level normal.  Patient received 1 dose of antibiotics in the ED.  It was not continued on admission.  I agree with observing off antibiotics at this time.   Continued bronchodilators. 2D echocardiogram, pending Home oxygen evaluation for DC planning.  Acute metabolic encephalopathy, possibly secondary to UTI Repeat CT head on 04/05/2021 no acute intracranial abnormality Reorient as needed Fall precautions   Chronic atrial fibrillation -Rate controlled on admission.   Continue digoxin and Xarelto.  -Monitoring telemetry monitoring with concern of digoxin toxicity due to hypokalemia  Refractory hypokalemia -Potassium level 3.4 despite replacement orally.   Replete potassium level -Replete potassium level tomorrow. Recent Labs  Lab 04/03/21 1806 04/03/21 1821 04/04/21 0125 04/04/21 1129 04/05/21 0047  K 3.0* 3.3* 2.8* 3.3* 3.4*  MG  --   --  1.8  --   --    Gram-negative rods UTI, POA -Presented with UA positive for pyuria Urine culture obtained on 04/03/2025 positive for greater than 100,000 colonies of gram-negative rods Follow ID and sensitivities Start Rocephin empirically  Chronic hypotension on Florinef and midodrine -Home meds include Florinef 0.1 mg daily and midodrine 2.5 mg TIDAC.  -Patient actually has hypertension in the hospital.  Florinef continued.  Midodrine on hold.  Continue to monitor blood pressure. Maintain MAP greater than 65 Continue to closely monitor vital signs   Hypothyroidism Continue Armour thyroid supplement.   Hyperlipidemia Continue Zetia.   Dementia Continue delirium precautions, Lexapro.  Mobility: PT eval Living condition: Lives in ALF Goals of care:   Code  Status: Full Code  Nutritional status: Body mass index is 21.53 kg/m.     Diet:  Diet Order             Diet Heart Room service appropriate? Yes; Fluid consistency: Thin  Diet  effective now                  DVT prophylaxis:   Rivaroxaban (XARELTO) tablet 15 mg   Antimicrobials: None Fluid: None Consultants: None Family Communication: None at bedside  Status is: Observation  Remains inpatient appropriate because: Pending echocardiogram.  Potassium level severely low which is significant in the setting of digoxin use  Dispo: The patient is from: ALF              Anticipated d/c is to: Back to ALF hopefully tomorrow.              Patient currently is not medically stable to d/c.   Difficult to place patient No     Infusions:    Scheduled Meds:  digoxin  0.25 mg Oral Daily   escitalopram  5 mg Oral Daily   ezetimibe  10 mg Oral QHS   fludrocortisone  0.1 mg Oral Daily   fluticasone furoate-vilanterol  1 puff Inhalation Daily   And   umeclidinium bromide  1 puff Inhalation Daily   furosemide  40 mg Intravenous Q12H   potassium chloride  20 mEq Oral Daily   Rivaroxaban  15 mg Oral Daily   sodium chloride flush  3 mL Intravenous Q12H   thyroid  30 mg Oral QAC breakfast   traZODone  50 mg Oral QHS    PRN meds: acetaminophen **OR** acetaminophen, albuterol, lip balm, ondansetron **OR** ondansetron (ZOFRAN) IV, senna-docusate   Antimicrobials: Anti-infectives (From admission, onward)    Start     Dose/Rate Route Frequency Ordered Stop   04/03/21 2300  cefTRIAXone (ROCEPHIN) 1 g in sodium chloride 0.9 % 100 mL IVPB        1 g 200 mL/hr over 30 Minutes Intravenous  Once 04/03/21 2255 04/04/21 0003   04/03/21 2300  azithromycin (ZITHROMAX) 500 mg in sodium chloride 0.9 % 250 mL IVPB        500 mg 250 mL/hr over 60 Minutes Intravenous  Once 04/03/21 2255 04/04/21 0107       Objective: Vitals:   04/05/21 1100 04/05/21 1200  BP: 113/60 118/68  Pulse: 71 66  Resp: (!) 28   Temp: 97.8 F (36.6 C) 98 F (36.7 C)  SpO2: 94% 96%    Intake/Output Summary (Last 24 hours) at 04/05/2021 1255 Last data filed at 04/05/2021 1200 Gross per  24 hour  Intake 243 ml  Output 1300 ml  Net -1057 ml   Filed Weights   04/03/21 1815 04/04/21 0500 04/05/21 0636  Weight: 59 kg 54.6 kg 53.4 kg   Weight change: -5.568 kg Body mass index is 21.53 kg/m.   Physical Exam: General exam: Well-developed well-nourished in no acute distress.  She is alert and interactive.   Skin: No rashes lesions or ulcers. HEENT: Atraumatic normocephalic.   Lungs: Clear to auscultation no wheezes or rales. CVS: Regular rate and rhythm no rubs or gallops.  No JVD aortomegaly noted. GI/Abd soft nontender normal bowel sounds present.   CNS: Alert and awake moves all 4 extremities. Psychiatry: Mood is appropriate for condition and setting. Extremities: No lower extremity edema bilaterally.  Data Review: I have personally reviewed the laboratory data and studies available.  F/u  labs ordered Unresulted Labs (From admission, onward)     Start     Ordered   04/05/21 0500  CBC with Differential/Platelet  Daily,   R      04/04/21 0820   04/05/21 [id number]  Basic metabolic panel  Daily,   R      04/04/21 0820            Signed, Kayleen Memos, MD [facility] 04/05/2021

## 2021-04-06 ENCOUNTER — Encounter (HOSPITAL_COMMUNITY): Payer: Self-pay | Admitting: Internal Medicine

## 2021-04-06 ENCOUNTER — Inpatient Hospital Stay (HOSPITAL_COMMUNITY): Payer: Medicare Other

## 2021-04-06 DIAGNOSIS — I361 Nonrheumatic tricuspid (valve) insufficiency: Secondary | ICD-10-CM

## 2021-04-06 DIAGNOSIS — I340 Nonrheumatic mitral (valve) insufficiency: Secondary | ICD-10-CM

## 2021-04-06 DIAGNOSIS — J9601 Acute respiratory failure with hypoxia: Secondary | ICD-10-CM | POA: Diagnosis not present

## 2021-04-06 LAB — CBC WITH DIFFERENTIAL/PLATELET
Abs Immature Granulocytes: 0.08 10*3/uL — ABNORMAL HIGH (ref 0.00–0.07)
Basophils Absolute: 0.1 10*3/uL (ref 0.0–0.1)
Basophils Relative: 1 %
Eosinophils Absolute: 0.2 10*3/uL (ref 0.0–0.5)
Eosinophils Relative: 2 %
HCT: 39.9 % (ref 36.0–46.0)
Hemoglobin: 12.9 g/dL (ref 12.0–15.0)
Immature Granulocytes: 1 %
Lymphocytes Relative: 24 %
Lymphs Abs: 2.5 10*3/uL (ref 0.7–4.0)
MCH: 30.7 pg (ref 26.0–34.0)
MCHC: 32.3 g/dL (ref 30.0–36.0)
MCV: 95 fL (ref 80.0–100.0)
Monocytes Absolute: 1 10*3/uL (ref 0.1–1.0)
Monocytes Relative: 10 %
Neutro Abs: 6.7 10*3/uL (ref 1.7–7.7)
Neutrophils Relative %: 62 %
Platelets: 283 10*3/uL (ref 150–400)
RBC: 4.2 MIL/uL (ref 3.87–5.11)
RDW: 15 % (ref 11.5–15.5)
WBC: 10.5 10*3/uL (ref 4.0–10.5)
nRBC: 0 % (ref 0.0–0.2)

## 2021-04-06 LAB — MAGNESIUM: Magnesium: 1.9 mg/dL (ref 1.7–2.4)

## 2021-04-06 LAB — BASIC METABOLIC PANEL
Anion gap: 10 (ref 5–15)
BUN: 11 mg/dL (ref 8–23)
CO2: 28 mmol/L (ref 22–32)
Calcium: 8.7 mg/dL — ABNORMAL LOW (ref 8.9–10.3)
Chloride: 98 mmol/L (ref 98–111)
Creatinine, Ser: 0.92 mg/dL (ref 0.44–1.00)
GFR, Estimated: >60 mL/min (ref >60)
Glucose, Bld: 120 mg/dL — ABNORMAL HIGH (ref 70–99)
Potassium: 3.6 mmol/L (ref 3.5–5.1)
Sodium: 136 mmol/L (ref 135–145)

## 2021-04-06 LAB — ECHOCARDIOGRAM COMPLETE
Height: 62 in
S' Lateral: 2.6 cm
Weight: 1925.94 oz

## 2021-04-06 MED ORDER — MAGNESIUM SULFATE IN D5W 1-5 GM/100ML-% IV SOLN
1.0000 g | Freq: Once | INTRAVENOUS | Status: AC
  Administered 2021-04-06: 18:00:00 1 g via INTRAVENOUS
  Filled 2021-04-06: qty 100

## 2021-04-06 MED ORDER — MIDODRINE HCL 5 MG PO TABS
5.0000 mg | ORAL_TABLET | Freq: Two times a day (BID) | ORAL | Status: DC
  Administered 2021-04-07 – 2021-04-08 (×2): 5 mg via ORAL
  Filled 2021-04-06 (×2): qty 1

## 2021-04-06 MED ORDER — DIGOXIN 125 MCG PO TABS
0.1250 mg | ORAL_TABLET | Freq: Every day | ORAL | Status: DC
  Administered 2021-04-07 – 2021-04-08 (×2): 0.125 mg via ORAL
  Filled 2021-04-06 (×2): qty 1

## 2021-04-06 MED ORDER — MIDODRINE HCL 5 MG PO TABS
5.0000 mg | ORAL_TABLET | Freq: Two times a day (BID) | ORAL | Status: DC
  Filled 2021-04-06: qty 1

## 2021-04-06 MED ORDER — DIGOXIN 125 MCG PO TABS: 0.1250 mg | ORAL_TABLET | Freq: Every day | ORAL | Status: DC

## 2021-04-06 MED ORDER — FUROSEMIDE 40 MG PO TABS: 40.0000 mg | ORAL_TABLET | Freq: Every day | ORAL | Status: DC

## 2021-04-06 MED ORDER — FUROSEMIDE 40 MG PO TABS
40.0000 mg | ORAL_TABLET | Freq: Every day | ORAL | Status: DC
  Administered 2021-04-07 – 2021-04-08 (×3): 40 mg via ORAL
  Filled 2021-04-06 (×2): qty 1

## 2021-04-06 NOTE — Progress Notes (Addendum)
PROGRESS NOTE  Lindsay Rush  DOB: [birth year]  PCP: Roetta Sessions, NP [id number]  DOA: 04/03/2021  LOS: 2 days  Hospital Day: 4  Chief Complaint  Patient presents with   Fall    Brief narrative: Lindsay Rush is a 86 y.o. female with PMH significant for mild dementia without behavioral disturbance, HTN, HLD, chronic A. fib on Xarelto and digoxin, unspecified CHF, COPD, hypothyroidism who lives at [facility] assisted [facility]. Patient was brought into the ED on 11/24 for a fall.  CT head without contrast negative for acute intracranial normalities. CT cervical spine without contrast negative for cervical spine fracture or traumatic malalignment. CT chest with contrast showed diffuse interlobular septal thickening with patchy areas of airspace disease bilaterally, marked cardiomegaly, aortic atherosclerosis.   Patient was given IV ceftriaxone and azithromycin due to concern for CAP.  The hospitalist service was consulted to admit for further evaluation and management of acute hypoxic respiratory failure.  Hospital course complicated by acute metabolic encephalopathy which was found to be secondary to UTI, POA.  It quickly resolved after initiation of IV antibiotics.  04/06/2021: Patient was seen and examined at her bedside.  She is more alert today.  Oriented x3.  She denies having any pain.  She endorses a full bladder.  She has no new complaints.  Assessment/Plan: Fall at the facility -Reportedly fell out of bed to the floor without loss of consciousness -No significant external or bone injury in a skeletal survey -Continue monitoring telemetry, pending echocardiogram -Continue PT OT, fall precautions  Acute on chronic diastolic CHF Per her daughter via phone she has a history of congestive heart failure, unspecified. On presentation she was found to have pulmonary edema and hypoxia. Personally reviewed chest x-ray done on admission which shows increased of pulmonary  vascularity suggestive of pulmonary edema. Ongoing diuresing, on IV Lasix 40 mg twice daily and on potassium replacement. 2D echo done on 04/06/2021, results are pending. Strict I's and O's and daily weights Cardiology consulted to assist with the management. Patient does not have a cardiologist in town, she recently relocated to [place] from [place].  She is receptive to follow-up with one of our cardiologist.  Acute respiratory failure with hypoxia secondary to pulmonary edema seen on chest x-ray. -Not on supplemental oxygen at home.   -In the ED, O2 sat was low at 89% and required supplemental oxygen.  No wheezing. -CT chest negative for PE, nonspecific changes seen suspicious for early pulmonary edema or viral pneumonia.  No fever.  WBC count, lactic acid level normal.  Patient received 1 dose of antibiotics in the ED.  It was not continued on admission.   Continue bronchodilators. O2 saturation currently 95% on room air. 2D echocardiogram, results are pending.  Acute metabolic encephalopathy, possibly secondary to Klebsiella Pneumoniae UTI, POA. Repeat CT head on 04/05/2021 no acute intracranial abnormality Continue to reorient as needed Continue fall precautions  Klebsiella pneumonia UTI, POA Urine culture growing greater than 100,000 colonies of Klebsiella pneumonia Sensitivities are pending. She is on Rocephin empirically, continue.   Chronic atrial fibrillation Currently rate controlled Continue digoxin and Xarelto.  Dig level was low 0.7 on 04/04/2021 Continue to closely monitor on telemetry.  Resolved: Post repletion refractory hypokalemia  Chronic hypotension on Florinef and midodrine BP is currently stable off midodrine, continue Florinef -Home meds include Florinef 0.1 mg daily and midodrine 2.5 mg TIDAC.  Continue to closely monitor vital signs Continue maintain MAP greater than 65   Hypothyroidism Continue Armour thyroid  supplement.    Hyperlipidemia Continue Zetia.   Mild dementia without behavioral disturbance. Continue delirium precautions, Lexapro.  Physical debility/ambulatory dysfunction PT assessed and recommended home health PT OT Continue PT OT with assistance and fall precautions  Critical care time: 45 minutes.   Mobility: Continue Living condition: Lives in ALF Goals of care:   Code Status: Full Code  Nutritional status: Body mass index is 22.02 kg/m.     Diet:  Diet Order             Diet Heart Room service appropriate? Yes; Fluid consistency: Thin  Diet effective now                  DVT prophylaxis:   Rivaroxaban (XARELTO) tablet 15 mg   Antimicrobials: None Fluid: None Consultants: Cardiology. Family Communication: Updated her daughter via phone.  Status is: Inpatient.  Patient requires at least 2 midnights for further evaluation and treatment of present condition.  Dispo: The patient is from: ALF              Anticipated d/c is to: Back to ALF hopefully tomorrow.              Patient currently is not medically stable to d/c.   Difficult to place patient No     Infusions:   cefTRIAXone (ROCEPHIN)  IV 200 mL/hr at 04/05/21 2200    Scheduled Meds:  digoxin  0.25 mg Oral Daily   escitalopram  5 mg Oral Daily   ezetimibe  10 mg Oral QHS   fludrocortisone  0.1 mg Oral Daily   fluticasone furoate-vilanterol  1 puff Inhalation Daily   And   umeclidinium bromide  1 puff Inhalation Daily   furosemide  40 mg Intravenous Q12H   potassium chloride  20 mEq Oral Daily   Rivaroxaban  15 mg Oral Daily   sodium chloride flush  3 mL Intravenous Q12H   thyroid  30 mg Oral QAC breakfast   traZODone  50 mg Oral QHS    PRN meds: acetaminophen **OR** acetaminophen, albuterol, lip balm, ondansetron **OR** ondansetron (ZOFRAN) IV, senna-docusate   Antimicrobials: Anti-infectives (From admission, onward)    Start     Dose/Rate Route Frequency Ordered Stop   04/05/21 1400   cefTRIAXone (ROCEPHIN) 1 g in sodium chloride 0.9 % 100 mL IVPB        1 g 200 mL/hr over 30 Minutes Intravenous Every 24 hours 04/05/21 1305     04/03/21 2300  cefTRIAXone (ROCEPHIN) 1 g in sodium chloride 0.9 % 100 mL IVPB        1 g 200 mL/hr over 30 Minutes Intravenous  Once 04/03/21 2255 04/04/21 0003   04/03/21 2300  azithromycin (ZITHROMAX) 500 mg in sodium chloride 0.9 % 250 mL IVPB        500 mg 250 mL/hr over 60 Minutes Intravenous  Once 04/03/21 2255 04/04/21 0107       Objective: Vitals:   04/06/21 0809 04/06/21 0933  BP: 128/67   Pulse: 79 77  Resp: 18   Temp: 97.7 F (36.5 C)   SpO2: 94%     Intake/Output Summary (Last 24 hours) at 04/06/2021 1054 Last data filed at 04/05/2021 2200 Gross per 24 hour  Intake 203 ml  Output --  Net 203 ml   Filed Weights   04/04/21 0500 04/05/21 0636 04/06/21 0349  Weight: 54.6 kg 53.4 kg 54.6 kg   Weight change: 1.2 kg Body mass index is 22.02 kg/m.  Physical Exam: General exam: Well-developed well-nourished in no acute distress.  She is alert and oriented x3. Skin: No rashes lesions or ulcerative lesions  HEENT: Atraumatic, normocephalic. Lungs: Clear to auscultation with no wheezes or rales. CVS: Regular rate and rhythm no rubs or gallops.  No JVD or thyromegaly noted.   GI/Abd soft nontender normal bowel sounds present. CNS: Alert and awake, moves all 4 extremities equally. Psychiatry: Mood is appropriate for condition and setting. Extremities: No lower extremity edema bilaterally.  Data Review: I have personally reviewed the laboratory data and studies available.  F/u labs ordered Unresulted Labs (From admission, onward)     Start     Ordered   04/05/21 0500  CBC with Differential/Platelet  Daily,   R      04/04/21 0820   04/05/21 [id number]  Basic metabolic panel  Daily,   R      04/04/21 0820            Signed, Kayleen Memos, MD [facility] 04/06/2021

## 2021-04-06 NOTE — Progress Notes (Signed)
SATURATION QUALIFICATIONS: (This note is used to comply with regulatory documentation for home oxygen)  Patient Saturations on Room Air at Rest = 87 %  Patient saturation on 2L at rest = 90%  Please briefly explain why patient needs home oxygen: Pt desats to 87 % at rest in bed with out oxygen, will need 02.

## 2021-04-06 NOTE — Progress Notes (Signed)
  Echocardiogram 2D Echocardiogram has been performed.  Delcie Roch 04/06/2021, 11:05 AM

## 2021-04-06 NOTE — Plan of Care (Signed)
  Problem: Education: Goal: Knowledge of General Education information will improve Description: Including pain rating scale, medication(s)/side effects and non-pharmacologic comfort measures Outcome: Progressing   Problem: Health Behavior/Discharge Planning: Goal: Ability to manage health-related needs will improve Outcome: Progressing   Problem: Clinical Measurements: Goal: Ability to maintain clinical measurements within normal limits will improve Outcome: Progressing Goal: Will remain free from infection Outcome: Progressing Goal: Diagnostic test results will improve Outcome: Progressing Goal: Respiratory complications will improve Outcome: Progressing Goal: Cardiovascular complication will be avoided Outcome: Progressing   Problem: Activity: Goal: Risk for activity intolerance will decrease Outcome: Progressing   Problem: Nutrition: Goal: Adequate nutrition will be maintained Outcome: Progressing   Problem: Coping: Goal: Level of anxiety will decrease Outcome: Progressing   Problem: Elimination: Goal: Will not experience complications related to bowel motility Outcome: Progressing Goal: Will not experience complications related to urinary retention Outcome: Progressing   Problem: Pain Managment: Goal: General experience of comfort will improve Outcome: Progressing   Problem: Safety: Goal: Ability to remain free from injury will improve Outcome: Progressing   Problem: Skin Integrity: Goal: Risk for impaired skin integrity will decrease Outcome: Progressing   Problem: Education: Goal: Ability to demonstrate management of disease process will improve Outcome: Progressing Goal: Ability to verbalize understanding of medication therapies will improve Outcome: Progressing Goal: Individualized Educational Video(s) Outcome: Progressing   Problem: Activity: Goal: Capacity to carry out activities will improve Outcome: Progressing   Problem: Cardiac: Goal:  Ability to achieve and maintain adequate cardiopulmonary perfusion will improve Outcome: Progressing   Problem: Education: Goal: Ability to demonstrate management of disease process will improve Outcome: Progressing Goal: Ability to verbalize understanding of medication therapies will improve Outcome: Progressing Goal: Individualized Educational Video(s) Outcome: Progressing   Problem: Activity: Goal: Capacity to carry out activities will improve Outcome: Progressing   Problem: Cardiac: Goal: Ability to achieve and maintain adequate cardiopulmonary perfusion will improve Outcome: Progressing   

## 2021-04-06 NOTE — Consult Note (Addendum)
Cardiology Consultation:   Patient ID: Lindsay Rush MRN: [id number]; DOB: Sep 23, 1934  Admit date: 04/03/2021 Date of Consult: 04/06/2021  PCP:  Lindsay Man, NP   [facility] Providers Cardiologist:  New to [facility] - had not yet established with cardiology here Click here to update MD or APP on Care Team, Refresh:1}     Patient Profile:   Lindsay Rush is a 86 y.o. female with a hx of reported chronic atrial fibrillation on Xarelto, COPD, hypothyroidism, HLD, suspected dementia, chronic diastolic CHF, chronic hypotension on Florinef/midodrine who is being seen 04/06/2021 for the evaluation of CHF at the request of Dr. Margo Aye.  History of Present Illness:   [name] came to the hospital from assisted living facility. The patient is unable to recall her history. From cardiac standpoint, she is on Lasix, digoxin, Xarelto, midodrine, and Florinef chronically per [facility] at her ALF. I spoke with her daughter Lindsay Rush via phone. The patient was moved from [facility] [place] in October to [facility] assisted living. She states her mom has had atrial fib for over 20 years. Lindsay Rush not recall prior cardioversions or whether paroxysmal/permanent. She had planned knee surgery in August. She then had a hospitalization at [facility] in Aug/September for hypoxia in the 70s and was told she had heart failure. They do not recall further testing details. No known hx of cath, stents, bypass. She was also found to have low blood pressure during that admission and placed on supportive medications. Lindsay Rush says her mother has never been formally diagnosed with dementia but she and her family all feel she has it due to short term memory loss, intermittent confusion and difficulty with time (I.e. she has said she thought she has been in the hospital for 3 weeks). She's also had at least 4 falls in the last few months since arriving to [place]. Lindsay Rush's been concerned that she sometimes  forgets to lock her wheelchair.  She presented to the hospital 11/24 with a fall. She was apparently lying in bed when she fell to the floor. She hit her head but did not lose consciousness. She was found to be hypoxic 89% on RA upon presentation to the hospital, placed on supplemental O2. Labs notable for INR 2.4 (clinically irrelevant as she is on Xarelto), hypokalemia in low 3 range, Mg 1.8, negative Covid/flu, normal CBC, BNP 279, procalcitonin <0.10, digoxin level 0.7. CXR showed diffuse hazy interstitial prominence which may be acute or chronic, cannot exclude acute infection, borderline cardiomegaly. CT chest showed diffuse interlobular septal thickening with patchy areas of airspace disease bilaterally, viral PNA or early edema could give this appearance, marked cardiomegaly, aortic atherosclerosis, trivial pericardial effusion. Initial CT head nonacute. UCx + Klebsiella. She's been treated with IV Lasix and antibiotics.  Yesterday the patient had a mental status change where the patient was trying to exit the bed and took multiple prompts to state her name. Repeat head Ct was nonacute but did show redemonstrated focus of chronic encephalomalacia/gliosis within the anterolateral left frontal lobe, which may be posttraumatic in etiology or may reflect a small remote infarct, mild chronic small vessel ischemic changes and generalized atrophy. It was felt she was experiencing metabolic encephaloapthy due to her UTI. 2D Echo today showed EF 55-60%, mild LVH, indeterminate diastolic parameters, mildly reduced RV systolic function, mild BAE, bileaflet MVP with myxomatous mitral valve, mild-moderate MR, mild-moderate TR. Cardiology consulted to assist with management of possible CHF. Today the patient denies any acute complaints of CP, SOB,  edema, orthopnea, palpitations or dizziness.  Stated weight on admission 130 but next day was 120.4 -> 117.73 -> 120.37, net 3.2L output though not recorded yesterday -  doubt complete.   Past Medical History:  Diagnosis Date   Atrial fibrillation (HCC)    Chronic diastolic CHF (congestive heart failure) (HCC)    Chronic hypotension    COPD (chronic obstructive pulmonary disease) (HCC)    Dementia ([place])    Fall    Hyperlipidemia    Hypertension    Hypothyroidism    Thyrotoxicosis     History reviewed. No pertinent surgical history.   Home Medications:  Prior to Admission medications   Medication Sig Start Date End Date Taking? Authorizing Provider  acetaminophen (TYLENOL) 325 MG tablet Take 650 mg by mouth every 6 (six) hours as needed for moderate pain or headache.   Yes [provider]  digoxin (LANOXIN) 0.25 MG tablet Take 0.25 mg by mouth daily.   Yes [provider]  escitalopram (LEXAPRO) 5 MG tablet Take 5 mg by mouth daily.   Yes [provider]  ezetimibe (ZETIA) 10 MG tablet Take 10 mg by mouth at bedtime.   Yes [provider]  fludrocortisone (FLORINEF) 0.1 MG tablet Take 0.1 mg by mouth daily.   Yes [provider]  Fluticasone-Umeclidin-Vilant (TRELEGY ELLIPTA) 200-62.5-25 MCG/ACT AEPB Inhale 1 puff into the lungs daily.   Yes [provider]  furosemide (LASIX) 40 MG tablet Take 40 mg by mouth daily.   Yes [provider]  midodrine (PROAMATINE) 2.5 MG tablet Take 2.5 mg by mouth 3 (three) times daily with meals.   Yes [provider]  polyethylene glycol powder (GLYCOLAX/MIRALAX) 17 GM/SCOOP powder Take 17 g by mouth daily.   Yes [provider]  Rivaroxaban (XARELTO) 15 MG TABS tablet Take 15 mg by mouth daily.   Yes [provider]  thyroid (ARMOUR) 30 MG tablet Take 30 mg by mouth daily before breakfast.   Yes [provider]  traZODone (DESYREL) 50 MG tablet Take 50 mg by mouth at bedtime.   Yes [provider]    Inpatient Medications: Scheduled Meds:  digoxin  0.25 mg Oral Daily   escitalopram  5 mg Oral Daily    ezetimibe  10 mg Oral QHS   fludrocortisone  0.1 mg Oral Daily   fluticasone furoate-vilanterol  1 puff Inhalation Daily   And   umeclidinium bromide  1 puff Inhalation Daily   furosemide  40 mg Intravenous Q12H   potassium chloride  20 mEq Oral Daily   Rivaroxaban  15 mg Oral Daily   sodium chloride flush  3 mL Intravenous Q12H   thyroid  30 mg Oral QAC breakfast   traZODone  50 mg Oral QHS   Continuous Infusions:  cefTRIAXone (ROCEPHIN)  IV 200 mL/hr at 04/05/21 2200   PRN Meds: acetaminophen **OR** acetaminophen, albuterol, lip balm, ondansetron **OR** ondansetron (ZOFRAN) IV, senna-docusate  Allergies:    Allergies  Allergen Reactions   Codeine     Social History:   Social History   Socioeconomic History   Marital status: Widowed    Spouse name: Not on file   Number of children: Not on file   Years of education: Not on file   Highest education level: Not on file  Occupational History   Not on file  Tobacco Use   Smoking status: Never   Smokeless tobacco: Never  Vaping Use   Vaping Use: Every day  Substance  and Sexual Activity   Alcohol use: Never   Drug use: Never    Comment: unable to answer   Sexual activity: Not Currently    Comment: unable to answer  Other Topics Concern   Not on file  Social History Narrative   Not on file   Social Determinants of Health   Financial Resource Strain: Not on file  Food Insecurity: Not on file  Transportation Needs: Not on file  Physical Activity: Not on file  Stress: Not on file  Social Connections: Not on file  Intimate Partner Violence: Not on file    Family History:   Family History  Problem Relation Age of Onset   Heart attack Father      ROS:  Please see the history of present illness.  All other ROS reviewed and negative.     Physical Exam/Data:   Vitals:   04/06/21 0804 04/06/21 0809 04/06/21 0933 04/06/21 1120  BP:  128/67  114/69  Pulse:  79 77 79  Resp:  18  (!) 22  Temp:  97.7 F (36.5  C)  (!) 97.4 F (36.3 C)  TempSrc:  Oral  Oral  SpO2: 96% 94%  94%  Weight:      Height:        Intake/Output Summary (Last 24 hours) at 04/06/2021 1341 Last data filed at 04/06/2021 1300 Gross per 24 hour  Intake 683 ml  Output --  Net 683 ml   Last 3 Weights 04/06/2021 04/05/2021 04/04/2021  Weight (lbs) 120 lb 5.9 oz 117 lb 11.6 oz 120 lb 6.4 oz  Weight (kg) 54.6 kg 53.4 kg 54.613 kg     Body mass index is 22.02 kg/m.  General: Frail appearing elderly WF in no acute distress. Head: Normocephalic, atraumatic, sclera non-icteric, no xanthomas, nares are without discharge. Neck: Negative for carotid bruits. JVP not elevated. Lungs: Coarse and mildly diminished throughout, no wheezing or rhonchi. Breathing is unlabored. Heart: RRR S1 S2, soft SEM LSB, no rubs or gallops.  Abdomen: Soft, non-tender, non-distended with normoactive bowel sounds. No rebound/guarding. Extremities: No clubbing or cyanosis. No edema. Distal pedal pulses are 2+ and equal bilaterally. Neuro: Alert and oriented to self, place, year but not specific date. Otherwise has difficulty recalling precipitating events of admission. Moves all extremities spontaneously. Psych:  Responds to questions appropriately with a normal affect.   EKG:  The EKG was personally reviewed and demonstrates:  atrial fibrillation 80bpm, possible LVH, STTW changes in II, III, avF, V5-V6 similar to 02/2021, query digoxin effect  Telemetry:  Telemetry was personally reviewed and demonstrates:  atrial fib rate controlled  Relevant CV Studies: 2D Echo today   1. Left ventricular ejection fraction, by estimation, is 55 to 60%. The  left ventricle has normal function. The left ventricle has no regional  wall motion abnormalities. There is mild left ventricular hypertrophy.  Left ventricular diastolic parameters  are indeterminate.   2. Right ventricular systolic function is mildly reduced. The right  ventricular size is normal. There  is normal pulmonary artery systolic  pressure. The estimated right ventricular systolic pressure is 0000000 mmHg.   3. Left atrial size was mildly dilated.   4. Right atrial size was mildly dilated.   5. 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 Mild  to moderate mitral valve regurgitation. No evidence of mitral stenosis.   6. Tricuspid valve regurgitation is mild to moderate.   7. The aortic valve is grossly normal. Aortic valve regurgitation is  not  visualized. No aortic stenosis is present.   8. The inferior vena cava is normal in size with greater than 50%  respiratory variability, suggesting right atrial pressure of 3 mmHg.   Laboratory Data:  High Sensitivity Troponin:  No results for input(s): TROPONINIHS in the last 720 hours.   Chemistry Recent Labs  Lab 04/04/21 0125 04/04/21 1129 04/05/21 0047 04/06/21 0041  NA 137  --  135 136  K 2.8* 3.3* 3.4* 3.6  CL 101  --  97* 98  CO2 26  --  28 28  GLUCOSE 125*  --  119* 120*  BUN <5*  --  6* 11  CREATININE 0.72  --  0.76 0.92  CALCIUM 8.4*  --  8.9 8.7*  MG 1.8  --   --  1.9  GFRNONAA >60  --  >60 >60  ANIONGAP 10  --  10 10    Recent Labs  Lab 04/03/21 1806  PROT 7.4  ALBUMIN 3.5  AST 16  ALT 9  ALKPHOS 59  BILITOT 0.9   Lipids No results for input(s): CHOL, TRIG, HDL, LABVLDL, LDLCALC, CHOLHDL in the last 168 hours.  Hematology Recent Labs  Lab 04/04/21 0125 04/05/21 0047 04/06/21 0041  WBC 11.1* 10.9* 10.5  RBC 4.06 4.24 4.20  HGB 12.4 13.0 12.9  HCT 38.6 39.7 39.9  MCV 95.1 93.6 95.0  MCH 30.5 30.7 30.7  MCHC 32.1 32.7 32.3  RDW 14.9 14.8 15.0  PLT 271 277 283   Thyroid No results for input(s): TSH, FREET4 in the last 168 hours.  BNP Recent Labs  Lab 04/04/21 0125  BNP 279.4*    DDimer No results for input(s): DDIMER in the last 168 hours.   Radiology/Studies:  CT HEAD WO CONTRAST (5MM)  Result Date: 04/05/2021 CLINICAL DATA:  Mental status change, unknown cause.  EXAM: CT HEAD WITHOUT CONTRAST TECHNIQUE: Contiguous axial images were obtained from the base of the skull through the vertex without intravenous contrast. COMPARISON:  Head CT 04/03/2021. FINDINGS: Brain: Mild generalized cerebral and cerebellar atrophy. Redemonstrated focus of chronic encephalomalacia/gliosis within the anterolateral left frontal lobe, which may be posttraumatic in etiology or may reflect a small remote infarct. Mild patchy and ill-defined hypoattenuation within the cerebral white matter, nonspecific but compatible with chronic small vessel ischemic disease. There is no acute intracranial hemorrhage. No acute demarcated cortical infarct. No extra-axial fluid collection. No evidence of an intracranial mass. No midline shift. Vascular: No hyperdense vessel.  Atherosclerotic calcifications. Skull: Normal. Negative for fracture or focal lesion. Sinuses/Orbits: Visualized orbits show no acute finding. No significant paranasal sinus disease at the imaged levels. Other: Trace fluid within the right mastoid air cells. IMPRESSION: No evidence of acute intracranial abnormality. Redemonstrated focus of chronic encephalomalacia/gliosis within the anterolateral left frontal lobe, which may be posttraumatic in etiology or may reflect a small remote infarct. Mild chronic small vessel ischemic changes within the cerebral white matter. Mild generalized parenchymal atrophy. Trace right mastoid effusion. Electronically Signed   By: Jackey LogeKyle  Golden D.O.   On: 04/05/2021 12:30   CT HEAD WO CONTRAST  Result Date: 04/03/2021 CLINICAL DATA:  Trauma.  Pain.  Patient on blood thinners. EXAM: CT HEAD WITHOUT CONTRAST CT CERVICAL SPINE WITHOUT CONTRAST TECHNIQUE: Multidetector CT imaging of the head and cervical spine was performed following the standard protocol without intravenous contrast. Multiplanar CT image reconstructions of the cervical spine were also generated. COMPARISON:  None. FINDINGS: CT HEAD FINDINGS Brain:  No evidence of acute infarction,  hemorrhage, hydrocephalus, extra-axial collection or mass lesion/mass effect. Vascular: Calcified atherosclerosis in the intracranial carotids. Skull: Normal. Negative for fracture or focal lesion. Sinuses/Orbits: No acute finding. Other: None. CT CERVICAL SPINE FINDINGS Alignment: Normal. Skull base and vertebrae: No acute fracture. No primary bone lesion or focal pathologic process. Soft tissues and spinal canal: No prevertebral fluid or swelling. No visible canal hematoma. Disc levels:  Multilevel degenerative disc disease. Upper chest: No acute abnormalities. Other: No other abnormalities. IMPRESSION: 1. No acute intracranial abnormalities. 2. No cervical spine fracture or traumatic malalignment. Electronically Signed   By: Dorise Bullion III M.D.   On: 04/03/2021 18:53   CT Chest W Contrast  Result Date: 04/03/2021 CLINICAL DATA:  [place], anticoagulated, cough EXAM: CT CHEST WITH CONTRAST TECHNIQUE: Multidetector CT imaging of the chest was performed during intravenous contrast administration. CONTRAST:  9mL OMNIPAQUE IOHEXOL 300 MG/ML  SOLN COMPARISON:  04/03/2021 FINDINGS: Cardiovascular: The heart is enlarged, with prominent biatrial dilatation. No pericardial effusion. No evidence of thoracic aortic aneurysm or dissection. Mild atherosclerosis. Mediastinum/Nodes: Borderline enlarged mediastinal and hilar lymph nodes are identified, measuring up to 11 mm in the precarinal region. These are likely reactive. Thyroid, trachea, and esophagus are unremarkable. Lungs/Pleura: There is diffuse interlobular septal thickening, with scattered bilateral areas of nodular airspace disease. This could reflect early edema or atypical infection such as viral pneumonia. No effusion or pneumothorax. The central airways are patent. Upper Abdomen: No acute abnormality. Multiple hepatic hypodensities compatible with cysts. Musculoskeletal: No acute or destructive bony lesions. Reconstructed  images demonstrate no additional findings. IMPRESSION: 1. Diffuse interlobular septal thickening with patchy areas of airspace disease bilaterally. Viral pneumonia or early edema could give this appearance. 2. Marked cardiomegaly. 3.  Aortic Atherosclerosis (ICD10-I70.0). Electronically Signed   By: Randa Ngo M.D.   On: 04/03/2021 22:39   CT CERVICAL SPINE WO CONTRAST  Result Date: 04/03/2021 CLINICAL DATA:  Trauma.  Pain.  Patient on blood thinners. EXAM: CT HEAD WITHOUT CONTRAST CT CERVICAL SPINE WITHOUT CONTRAST TECHNIQUE: Multidetector CT imaging of the head and cervical spine was performed following the standard protocol without intravenous contrast. Multiplanar CT image reconstructions of the cervical spine were also generated. COMPARISON:  None. FINDINGS: CT HEAD FINDINGS Brain: No evidence of acute infarction, hemorrhage, hydrocephalus, extra-axial collection or mass lesion/mass effect. Vascular: Calcified atherosclerosis in the intracranial carotids. Skull: Normal. Negative for fracture or focal lesion. Sinuses/Orbits: No acute finding. Other: None. CT CERVICAL SPINE FINDINGS Alignment: Normal. Skull base and vertebrae: No acute fracture. No primary bone lesion or focal pathologic process. Soft tissues and spinal canal: No prevertebral fluid or swelling. No visible canal hematoma. Disc levels:  Multilevel degenerative disc disease. Upper chest: No acute abnormalities. Other: No other abnormalities. IMPRESSION: 1. No acute intracranial abnormalities. 2. No cervical spine fracture or traumatic malalignment. Electronically Signed   By: Dorise Bullion III M.D.   On: 04/03/2021 18:53   DG Pelvis Portable  Result Date: 04/03/2021 CLINICAL DATA:  Fall.  Patient on blood thinners. EXAM: PORTABLE PELVIS 1-2 VIEWS COMPARISON:  None. FINDINGS: There mild symmetric degenerative changes of the hips. There is no evidence of fracture or dislocation. Degenerative change of the spine. IMPRESSION: No acute  findings. Electronically Signed   By: Marin Olp M.D.   On: 04/03/2021 18:25   DG Chest Port 1 View  Result Date: 04/03/2021 CLINICAL DATA:  Fall.  Patient on blood thinners. EXAM: PORTABLE CHEST 1 VIEW COMPARISON:  None. FINDINGS: Lungs are adequately inflated and demonstrate bilateral diffuse  hazy interstitial prominence which may be acute or chronic. No lobar consolidation or effusion. No pneumothorax. Borderline cardiomegaly. No evidence of fracture. IMPRESSION: 1. No acute fracture. 2. Diffuse hazy interstitial prominence which may be acute or chronic. Acute infection is possible. 3. Borderline cardiomegaly. Electronically Signed   By: Marin Olp M.D.   On: 04/03/2021 18:25   DG Hand Complete Left  Result Date: 04/03/2021 CLINICAL DATA:  Fall.  Patient on blood thinners. EXAM: LEFT HAND - COMPLETE 3+ VIEW COMPARISON:  None. FINDINGS: There mild degenerate changes over the radiocarpal joint, carpal bones and first metacarpal joints. There mild degenerate changes over the MCP joints and interphalangeal joints. Bone alignment and mineralization is normal. There are no bony erosions. No acute fracture or dislocation. IMPRESSION: 1. No acute findings. 2. Degenerative changes as described. Electronically Signed   By: Marin Olp M.D.   On: 04/03/2021 18:27   ECHOCARDIOGRAM COMPLETE  Result Date: 04/06/2021    ECHOCARDIOGRAM REPORT   Patient Name:   ROTASHA GILL Date of Exam: 04/06/2021 Medical Rec #:  [id number]       Height:       62.0 in Accession #:    [id number]      Weight:       120.4 lb Date of Birth:  1934/09/19      BSA:          1.541 m Patient Age:    76 years        BP:           118/64 mmHg Patient Gender: F               HR:           83 bpm. Exam Location:  Inpatient Procedure: 2D Echo Indications:    congestive heart failure  History:        Patient has no prior history of Echocardiogram examinations.                 COPD, Arrythmias:Atrial Fibrillation; Risk  Factors:Dyslipidemia.  Sonographer:    Johny Chess RDCS Referring Phys: [id number] [place]  1. Left ventricular ejection fraction, by estimation, is 55 to 60%. The left ventricle has normal function. The left ventricle has no regional wall motion abnormalities. There is mild left ventricular hypertrophy. Left ventricular diastolic parameters are indeterminate.  2. Right ventricular systolic function is mildly reduced. The right ventricular size is normal. There is normal pulmonary artery systolic pressure. The estimated right ventricular systolic pressure is 0000000 mmHg.  3. Left atrial size was mildly dilated.  4. Right atrial size was mildly dilated.  5. 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 Mild to moderate mitral valve regurgitation. No evidence of mitral stenosis.  6. Tricuspid valve regurgitation is mild to moderate.  7. The aortic valve is grossly normal. Aortic valve regurgitation is not visualized. No aortic stenosis is present.  8. The inferior vena cava is normal in size with greater than 50% respiratory variability, suggesting right atrial pressure of 3 mmHg. FINDINGS  Left Ventricle: Left ventricular ejection fraction, by estimation, is 55 to 60%. The left ventricle has normal function. The left ventricle has no regional wall motion abnormalities. The left ventricular internal cavity size was normal in size. There is  mild left ventricular hypertrophy. Left ventricular diastolic parameters are indeterminate. Right Ventricle: The right ventricular size is normal. Right vetricular wall thickness was not well visualized. Right ventricular systolic function is mildly reduced.  There is normal pulmonary artery systolic pressure. The tricuspid regurgitant velocity is 2.58 m/s, and with an assumed right atrial pressure of 3 mmHg, the estimated right ventricular systolic pressure is 0000000 mmHg. Left Atrium: Left atrial size was mildly dilated. Right Atrium: Right  atrial size was mildly dilated. Pericardium: Trivial pericardial effusion is present. 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 Mild to moderate mitral valve regurgitation. No evidence of mitral valve stenosis. Tricuspid Valve: The tricuspid valve is grossly normal. Tricuspid valve regurgitation is mild to moderate. No evidence of tricuspid stenosis. Aortic Valve: The aortic valve is grossly normal. Aortic valve regurgitation is not visualized. No aortic stenosis is present. Pulmonic Valve: The pulmonic valve was grossly normal. Pulmonic valve regurgitation is mild. No evidence of pulmonic stenosis. Aorta: The aortic root is normal in size and structure. Venous: The inferior vena cava is normal in size with greater than 50% respiratory variability, suggesting right atrial pressure of 3 mmHg. IAS/Shunts: No atrial level shunt detected by color flow Doppler.  LEFT VENTRICLE PLAX 2D LVIDd:         3.70 cm LVIDs:         2.60 cm LV PW:         1.00 cm LV IVS:        1.10 cm LVOT diam:     1.70 cm LVOT Area:     2.27 cm  IVC IVC diam: 1.00 cm LEFT ATRIUM             Index        RIGHT ATRIUM           Index LA diam:        4.10 cm 2.66 cm/m   RA Area:     14.70 cm LA Vol (A2C):   48.6 ml 31.55 ml/m  RA Volume:   33.60 ml  21.81 ml/m LA Vol (A4C):   50.3 ml 32.65 ml/m LA Biplane Vol: 49.7 ml 32.26 ml/m   AORTA Ao Root diam: 2.90 cm Ao Asc diam:  3.30 cm TRICUSPID VALVE TR Peak grad:   26.6 mmHg TR Vmax:        258.00 cm/s  SHUNTS Systemic Diam: 1.70 cm Cherlynn Kaiser MD Electronically signed by Cherlynn Kaiser MD Signature Date/Time: 04/06/2021/12:32:50 PM    Final      Assessment and Plan:   1. Hypoxia/abnormal CXR/chest CT, possible viral PNA, acute on chronic diastolic CHF, query underlying ILD - hypoxic upon admission; BNP mildly elevated but fairly low, with nonspecific patchy changes on CXR/CT - diuresed with IV Lasix - weight without significant change and I/O's  not complete but appears clinically improved - per d/w Dr. Audie Box, transition tomorrow to oral Lasix - question underlying ILD contributing to hypoxia on arrival - consider pulm consultation - will need eval for home O2 prior to dc  2. Chronic atrial fibrillation - rate controlled on digoxin - GFR OK, CrCl 54ml/min - given GFR, age, and EKG findings, will decrease digoxin dose to 0.125mg  daily - continue Xarelto, renally adjusted dose is appropriate - but needs PT to see to assess to further ameliorate fall risk  3. Valvular disease with myxomatous mitral valve with bileaflet MVP, mild-moderate MR, mild-moderate TR - per preliminary review of echocardiogram with MD, continue medical management  4. Hypothyroidism - recommend TSH with labs in AM  5. Hypokalemia - management per primary team  6. Hypotension - present since Aug/September admission per daughter, on Florinef and  midodrine as OP - Florinef continued on admission, midodrine not yet resumed - given question of fluid retention, recommend transition to midodrine monotherapy as Florinef is not ideal in heart failure  7. Falls - will need PT/OT eval  8. HLD - maintained on home Zetia  9. Klebsiella UTI - per IM  Risk Assessment/Risk Scores:        New York Heart Association (NYHA) Functional Class NYHA Class II  CHA2DS2-VASc Score = 4   This indicates a 4.8% annual risk of stroke. The patient's score is based upon: CHF History: 1 HTN History: 0 Diabetes History: 0 Stroke History: 0 Vascular Disease History: 0 Age Score: 2 Gender Score: 1      For questions or updates, please contact [place] Please consult [email or web address] for contact info under    Signed, Charlie Pitter, PA-C  04/06/2021 1:41 PM

## 2021-04-06 NOTE — Evaluation (Signed)
Clinical/Bedside Swallow Evaluation Patient Details  Name: Lindsay Rush MRN: [id number] Date of Birth: Aug 22, 1934  Today's Date: 04/06/2021 Time: SLP Start Time (ACUTE ONLY): 0945 SLP Stop Time (ACUTE ONLY): 1000 SLP Time Calculation (min) (ACUTE ONLY): 15 min  Past Medical History:  Past Medical History:  Diagnosis Date   Atrial fibrillation (HCC)    COPD (chronic obstructive pulmonary disease) ([place])    Hypertension    Thyrotoxicosis    Past Surgical History: History reviewed. No pertinent surgical history. HPI:  Lindsay Rush is a 86 y.o. female with medical history significant for chronic atrial fibrillation on Xarelto, COPD, CHF, hypothyroidism, hyperlipidemia, dementia admitted after a fall at her facility. Dx acute resp failure with hypoxia, acute metabolic encephalopathy.  Neuro change 11/26 - CT head showed no acute changes; pt returned to baseline within 1-2 hours.  Has had intermittent cough, which precipitated swallowing consult.    Assessment / Plan / Recommendation  Clinical Impression  Pt presents with occasional delayed coughing after drinking thin liquids - it is not consistent.  She demonstrates thorough mastication, good toleration of pureed and regular solids.  Swallow response is palpable.  Her mental status is adequate for safe PO intake.  Difficult to discern if cough is directly related to oropharyngeal swallow or if its origin is esophageal or otherwise. Recommend continuing regular diet/thin liquids; SLP will f/u briefly to ensure safety with current diet and to determine if instrumental study would be beneficial, particularly if cough persists.  D/W RN. SLP Visit Diagnosis: Dysphagia, unspecified (R13.10)    Aspiration Risk  No limitations    Diet Recommendation   Continue regular diet, thin liquids  Medication Administration: Whole meds with liquid    Other  Recommendations Oral Care Recommendations: Oral care BID    Recommendations for follow up  therapy are one component of a multi-disciplinary discharge planning process, led by the attending physician.  Recommendations may be updated based on patient status, additional functional criteria and insurance authorization.  Follow up Recommendations Other (comment) (tba)      Assistance Recommended at Discharge Intermittent Supervision/Assistance  Functional Status Assessment Patient has had a recent decline in their functional status and/or demonstrates limited ability to make significant improvements in function in a reasonable and predictable amount of time  Frequency and Duration min 1 x/week  1 week       Prognosis        Swallow Study   General HPI: Lindsay Rush is a 86 y.o. female with medical history significant for chronic atrial fibrillation on Xarelto, COPD, CHF, hypothyroidism, hyperlipidemia, dementia admitted after a fall at her facility. Dx acute resp failure with hypoxia, acute metabolic encephalopathy.  Neuro change 11/26 - CT head showed no acute changes; pt returned to baseline within 1-2 hours.  Has had intermittent cough, which precipitated swallowing consult. Type of Study: Bedside Swallow Evaluation Previous Swallow Assessment: no Diet Prior to this Study: Regular;Thin liquids Temperature Spikes Noted: No Respiratory Status: Nasal cannula History of Recent Intubation: No Behavior/Cognition: Alert;Cooperative;Confused Oral Cavity Assessment: Within Functional Limits Vision: Functional for self-feeding Self-Feeding Abilities: Able to feed self Patient Positioning: Upright in bed Baseline Vocal Quality: Normal Volitional Cough: Strong Volitional Swallow: Able to elicit    Oral/Motor/Sensory Function Overall Oral Motor/Sensory Function: Within functional limits   Ice Chips Ice chips: Within functional limits   Thin Liquid Thin Liquid: Impaired Presentation: Cup;Straw Pharyngeal  Phase Impairments: Cough - Delayed    Nectar Thick Nectar Thick Liquid:  Not tested  Honey Thick Honey Thick Liquid: Not tested   Puree Puree: Within functional limits   Solid     Solid: Within functional limits     Lindsay Rush L. Lindsay Frederic, MA CCC/SLP Acute Rehabilitation Services Office number [phone] Pager [phone]  Lindsay Rush Lindsay Rush 04/06/2021,10:25 AM

## 2021-04-07 DIAGNOSIS — J9601 Acute respiratory failure with hypoxia: Secondary | ICD-10-CM | POA: Diagnosis not present

## 2021-04-07 LAB — URINE CULTURE: Culture: >=100000 — AB

## 2021-04-07 LAB — TSH: TSH: 1.264 u[IU]/mL (ref 0.350–4.500)

## 2021-04-07 LAB — RESP PANEL BY RT-PCR (FLU A&B, COVID) ARPGX2
Influenza A by PCR: NEGATIVE
Influenza B by PCR: NEGATIVE
SARS Coronavirus 2 by RT PCR: NEGATIVE

## 2021-04-07 LAB — BRAIN NATRIURETIC PEPTIDE: B Natriuretic Peptide: 81 pg/mL (ref 0.0–100.0)

## 2021-04-07 MED ORDER — SACCHAROMYCES BOULARDII 250 MG PO CAPS: 250.0000 mg | ORAL_CAPSULE | Freq: Two times a day (BID) | ORAL | 0 refills | Status: AC

## 2021-04-07 MED ORDER — FUROSEMIDE 40 MG PO TABS: 40.0000 mg | ORAL_TABLET | Freq: Every day | ORAL | 0 refills | Status: AC

## 2021-04-07 MED ORDER — MIDODRINE HCL 5 MG PO TABS: 5.0000 mg | ORAL_TABLET | Freq: Two times a day (BID) | ORAL | 0 refills | Status: AC

## 2021-04-07 MED ORDER — CEPHALEXIN 500 MG PO CAPS: 500.0000 mg | ORAL_CAPSULE | Freq: Three times a day (TID) | ORAL | 0 refills | Status: AC

## 2021-04-07 MED ORDER — SACCHAROMYCES BOULARDII 250 MG PO CAPS
250.0000 mg | ORAL_CAPSULE | Freq: Two times a day (BID) | ORAL | Status: DC
  Administered 2021-04-07 – 2021-04-08 (×3): 250 mg via ORAL
  Filled 2021-04-07 (×3): qty 1

## 2021-04-07 MED ORDER — POTASSIUM CHLORIDE 20 MEQ PO PACK: 20.0000 meq | PACK | Freq: Every day | ORAL | 0 refills | Status: DC

## 2021-04-07 MED ORDER — DIGOXIN 125 MCG PO TABS: 0.1250 mg | ORAL_TABLET | Freq: Every day | ORAL | 0 refills | Status: AC

## 2021-04-07 MED ORDER — CEPHALEXIN 500 MG PO CAPS
500.0000 mg | ORAL_CAPSULE | Freq: Three times a day (TID) | ORAL | Status: DC
  Administered 2021-04-07 – 2021-04-08 (×3): 500 mg via ORAL
  Filled 2021-04-07 (×5): qty 1

## 2021-04-07 NOTE — TOC Progression Note (Addendum)
Transition of Care [facility]) - Progression Note    Patient Details  Name: Darielys Giglia MRN: [id number] Date of Birth: [birth year]  Transition of Care [facility]) CM/SW Contact  Ivette Loyal, [place] Phone Number: 04/07/2021, 2:44 PM  Clinical Narrative:    CSW contacted [facility] to inform facility of pt DC. Admissions stated that pt would need to come out to the hospital to do a screening. CSW faxed over pt information for facility to view, then will proceed with pt DC after confirmation.   Chip Boer was not able to complete assessment for pt DC today and will get here first thing in the morning. CSW updated pt and her daughter, CSW will complete pt DC after Chip Boer has seen her.    Expected Discharge Plan: Assisted Living Barriers to Discharge: Continued Medical Work up  Expected Discharge Plan and Services Expected Discharge Plan: Assisted Living     Post Acute Care Choice: Home Health Living arrangements for the past 2 months: Assisted Living Facility Expected Discharge Date: 04/07/21                         HH Arranged: PT           Social Determinants of Health (SDOH) Interventions    Readmission Risk Interventions No flowsheet data found.

## 2021-04-07 NOTE — Progress Notes (Signed)
Occupational Therapy Treatment Patient Details Name: Anahli Arvanitis MRN: [id number] DOB: 06/29/1934 Today's Date: 04/07/2021   History of present illness Lubertha Leite is a 86 y.o. female with medical history significant for chronic atrial fibrillation on Xarelto, COPD, ?  CHF, hypothyroidism, hyperlipidemia, dementia admitted after a fall at her facility.   OT comments  Patient continues to make progress towards goals in skilled OT session. Patient's session encompassed bed mobility and ADLs. Patient perseverating on headache at beginning of session, but motivated to complete grooming at sink (patient provided medication at beginning of session by RN). Patient requiring mod A with bed mobility in session, with increased difficulty noted with initiating movements. Breakfast tray arriving during session with patient unable to redirect back to grooming task therefore provided set up of tray. Patient left sitting EOB eating breakfast. Discharge remains appropriate; therapy will continue to follow while in house.    Recommendations for follow up therapy are one component of a multi-disciplinary discharge planning process, led by the attending physician.  Recommendations may be updated based on patient status, additional functional criteria and insurance authorization.    Follow Up Recommendations  Home health OT    Assistance Recommended at Discharge Intermittent Supervision/Assistance  Equipment Recommendations  None recommended by OT    Recommendations for Other Services      Precautions / Restrictions Precautions Precautions: Fall Restrictions Weight Bearing Restrictions: No       Mobility Bed Mobility Overal bed mobility: Needs Assistance Bed Mobility: Supine to Sit     Supine to sit: Mod assist     General bed mobility comments: difficulty elevating trunk to come into stting EOB without assist    Transfers                         Balance Overall balance  assessment: Needs assistance;History of Falls Sitting-balance support: Feet unsupported Sitting balance-Leahy Scale: Poor Sitting balance - Comments: attempting to scoot hips to place feet on floor but patient declining and stating increased pain, patient often leaning on L elbow when sitting EOB to eat breakfast but declining assistance from therapist to correct                                   ADL either performed or assessed with clinical judgement   ADL Overall ADL's : Needs assistance/impaired Eating/Feeding: Set up                   Lower Body Dressing: Maximal assistance Lower Body Dressing Details (indicate cue type and reason): increased difficulty reaching socks sitting EOB             Functional mobility during ADLs: Moderate assistance General ADL Comments: increased difficutly with bed mobilty in session, motivated to complete grooming at sink however distracted with breakfast tray and couldnt redirect    Extremity/Trunk Assessment              Vision       Perception     Praxis      Cognition Arousal/Alertness: Awake/alert Behavior During Therapy: WFL for tasks assessed/performed Overall Cognitive Status: No family/caregiver present to determine baseline cognitive functioning Area of Impairment: Orientation;Attention;Memory;Following commands;Problem solving                 Orientation Level: Disoriented to;Time Current Attention Level: Selective Memory: Decreased short-term memory Following Commands: Follows multi-step commands inconsistently;Follows one  step commands inconsistently Safety/Judgement: Decreased awareness of safety;Decreased awareness of deficits   Problem Solving: Slow processing;Difficulty sequencing;Requires verbal cues;Requires tactile cues General Comments: Peseverating on headache and toppings to put on her oatmeal, difficulty with redirection          Exercises     Shoulder Instructions        General Comments      Pertinent Vitals/ Pain       Pain Assessment: Faces Faces Pain Scale: Hurts even more Breathing: normal Negative Vocalization: none Facial Expression: smiling or inexpressive Body Language: relaxed Consolability: no need to console PAINAD Score: 0 Pain Location: headache Pain Descriptors / Indicators: Aching;Grimacing Pain Intervention(s): Limited activity within patient's tolerance;Monitored during session;Repositioned;RN gave pain meds during session  Home Living                                          Prior Functioning/Environment              Frequency  Min 2X/week        Progress Toward Goals  OT Goals(current goals can now be found in the care plan section)  Progress towards OT goals: Progressing toward goals  Acute Rehab OT Goals Patient Stated Goal: to eat this oatmeal OT Goal Formulation: Patient unable to participate in goal setting Time For Goal Achievement: 04/18/21 Potential to Achieve Goals: [place] Discharge plan remains appropriate    Co-evaluation                 AM-PAC OT "6 Clicks" Daily Activity     Outcome Measure   Help from another person eating meals?: None Help from another person taking care of personal grooming?: A Little Help from another person toileting, which includes using toliet, bedpan, or urinal?: A Little Help from another person bathing (including washing, rinsing, drying)?: A Little Help from another person to put on and taking off regular upper body clothing?: A Little Help from another person to put on and taking off regular lower body clothing?: A Lot 6 Click Score: 18    End of Session    OT Visit Diagnosis: Unsteadiness on feet (R26.81);Cognitive communication deficit (R41.841)   Activity Tolerance Patient limited by fatigue;Patient limited by pain   Patient Left in bed;with call bell/phone within reach;with bed alarm set   Nurse Communication  Mobility status        Time: [id number] OT Time Calculation (min): 22 min  Charges: OT General Charges $OT Visit: 1 Visit OT Treatments $Self Care/Home Management : 8-22 mins  Corinne Ports E. [place], [place] Acute Rehabilitation Services [phone] [place] 04/07/2021, 11:44 AM

## 2021-04-07 NOTE — NC FL2 (Signed)
[place] MEDICAID FL2 LEVEL OF CARE SCREENING TOOL     IDENTIFICATION  Patient Name: Lindsay Rush Birthdate: 1934-07-23 Sex: female Admission Date (Current Location): 04/03/2021  [facility] and [place] Number:  Producer, television/film/video and Address:  [facility] [place], [address] [place], [place] [address]      Provider Number: [id number]  Attending Physician Name and Address:  Darlin Drop, DO  Relative Name and Phone Number:  Christen Butter)   [phone]    Current Level of Care: Hospital Recommended Level of Care: Assisted Living Facility Prior Approval Number:    Date Approved/Denied:   PASRR Number:    Discharge Plan: Other (Comment) (ALF)    Current Diagnoses: Patient Active Problem List   Diagnosis Date Noted   Acute respiratory failure with hypoxemia (HCC) 04/04/2021   COPD (chronic obstructive pulmonary disease) (HCC)    Chronic atrial fibrillation (HCC)    Hypokalemia    Hypothyroidism    Hyperlipidemia    Dementia without behavioral disturbance (HCC)    Acute respiratory failure with hypoxia (HCC) 04/03/2021    Orientation RESPIRATION BLADDER Height & Weight     Self, Time, Situation, Place  Normal Continent, External catheter Weight: 120 lb 2.4 oz (54.5 kg) Height:  5\' 2"  (157.5 cm)  BEHAVIORAL SYMPTOMS/MOOD NEUROLOGICAL BOWEL NUTRITION STATUS      Continent Diet (See Dc Summary)  AMBULATORY STATUS COMMUNICATION OF NEEDS Skin   Extensive Assist Verbally Normal                       Personal Care Assistance Level of Assistance  Bathing, Feeding, Dressing Bathing Assistance: Maximum assistance Feeding assistance: Independent Dressing Assistance: Maximum assistance     Functional Limitations Info  Sight, Hearing, Speech Sight Info: Adequate Hearing Info: Adequate Speech Info: Adequate    SPECIAL CARE FACTORS FREQUENCY  PT (By licensed PT), OT (By licensed OT)     PT Frequency: 3x a week OT Frequency: 3x  a week            Contractures Contractures Info: Not present    Additional Factors Info  Code Status, Allergies Code Status Info: Full Allergies Info: Codeine           Current Medications (04/07/2021):  This is the current hospital active medication list Current Facility-Administered Medications  Medication Dose Route Frequency Provider Last Rate Last Admin   acetaminophen (TYLENOL) tablet 650 mg  650 mg Oral Q6H PRN 04/09/2021, MD   650 mg at 04/07/21 1019   Or   acetaminophen (TYLENOL) suppository 650 mg  650 mg Rectal Q6H PRN 04/09/21, MD       albuterol (PROVENTIL) (2.5 MG/3ML) 0.083% nebulizer solution 2.5 mg  2.5 mg Nebulization Q2H PRN Charlsie Quest, MD       cephALEXin (KEFLEX) capsule 500 mg  500 mg Oral Q8H Hall, Carole N, DO   500 mg at 04/07/21 1233   digoxin (LANOXIN) tablet 0.125 mg  0.125 mg Oral Daily 04/09/21, MD   0.125 mg at 04/07/21 0931   escitalopram (LEXAPRO) tablet 5 mg  5 mg Oral Daily 04/09/21 R, MD   5 mg at 04/07/21 0930   ezetimibe (ZETIA) tablet 10 mg  10 mg Oral QHS 04/09/21 R, MD   10 mg at 04/06/21 2117   fluticasone furoate-vilanterol (BREO ELLIPTA) 200-25 MCG/ACT 1 puff  1 puff Inhalation Daily 2118, MD  1 puff at 04/07/21 0901   And   umeclidinium bromide (INCRUSE ELLIPTA) 62.5 MCG/ACT 1 puff  1 puff Inhalation Daily Darreld Mclean R, MD   1 puff at 04/07/21 0901   furosemide (LASIX) tablet 40 mg  40 mg Oral Daily Dunn, Dayna N, PA-C   40 mg at 04/07/21 0931   lip balm (CARMEX) ointment   Topical PRN Charlsie Quest, MD   Given at 04/04/21 0335   midodrine (PROAMATINE) tablet 5 mg  5 mg Oral BID WC Hall, Carole N, DO       ondansetron (ZOFRAN) tablet 4 mg  4 mg Oral Q6H PRN Charlsie Quest, MD       Or   ondansetron (ZOFRAN) injection 4 mg  4 mg Intravenous Q6H PRN Darreld Mclean R, MD       potassium chloride (KLOR-CON) packet 20 mEq  20 mEq Oral Daily Darreld Mclean R, MD   20 mEq at 04/07/21  0932   Rivaroxaban (XARELTO) tablet 15 mg  15 mg Oral Daily Darreld Mclean R, MD   15 mg at 04/07/21 0932   saccharomyces boulardii (FLORASTOR) capsule 250 mg  250 mg Oral BID Dow Adolph N, DO   250 mg at 04/07/21 1232   senna-docusate (Senokot-S) tablet 1 tablet  1 tablet Oral QHS PRN Darreld Mclean R, MD       sodium chloride flush (NS) 0.9 % injection 3 mL  3 mL Intravenous Q12H Darreld Mclean R, MD   3 mL at 04/07/21 0932   thyroid (ARMOUR) tablet 30 mg  30 mg Oral QAC breakfast Darreld Mclean R, MD   30 mg at 04/07/21 4650   traZODone (DESYREL) tablet 50 mg  50 mg Oral QHS Charlsie Quest, MD   50 mg at 04/06/21 2117     Discharge Medications: Please see discharge summary for a list of discharge medications.  Relevant Imaging Results:  Relevant Lab Results:   Additional Information SSN: [id number]  Ivette Loyal, LCSWA
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
## 2021-04-07 NOTE — Discharge Summary (Signed)
Ox Discharge Summary  Lindsay Rush [id number] DOB: 1934-10-04  PCP: Roetta Sessions, NP  Admit date: 04/03/2021 Discharge date: 04/07/2021  Time spent: 35 minutes  Recommendations for Outpatient Follow-up:  Follow-up with cardiology Follow-up with your primary care provider Take your medications as prescribed Continue PT OT with assistance and fall precautions.  Discharge Diagnoses:  Active Hospital Problems   Diagnosis Date Noted   Acute respiratory failure with hypoxia ([place]) 04/03/2021   Acute respiratory failure with hypoxemia ([facility]) 04/04/2021   COPD (chronic obstructive pulmonary disease) (HCC)    Chronic atrial fibrillation (HCC)    Hypokalemia    Hypothyroidism    Hyperlipidemia    Dementia without behavioral disturbance [facility])     Resolved Hospital Problems  No resolved problems to display.    Discharge Condition: Stable  Diet recommendation: Resume previous diet.  Vitals:   04/07/21 0931 04/07/21 1200  BP:  131/71  Pulse: 81 86  Resp:  (!) 25  Temp:    SpO2:  (!) 89%    History of present illness:  Jessice Rush is a 86 y.o. female with PMH significant for mild dementia without behavioral disturbance, HTN, HLD, chronic A. fib on Xarelto and digoxin, unspecified CHF, COPD, hypothyroidism who lives at [facility] assisted [facility]. Patient was brought into the ED on 04/03/21 for a fall.  CT head without contrast negative for acute intracranial abnormalities. CT cervical spine without contrast negative for cervical spine fracture or traumatic malalignment. CT chest with contrast showed diffuse interlobular septal thickening with patchy areas of airspace disease bilaterally, marked cardiomegaly, aortic atherosclerosis.  She was hypoxic with O2 saturation in the 80s requiring 2 L nasal cannula to maintain O2 saturation greater than 90%.   Patient was given IV ceftriaxone and azithromycin due to concern for CAP.  She was admitted by the hospitalist  service.  Also found to have pulmonary edema and acute on chronic diastolic CHF.  She was diuresed and seen by cardiology.  She wants to continue to follow-up with Dr. Audie Box outpatient.  Hospital course complicated by an episode of acute metabolic encephalopathy which was thought to be secondary to Klebsiella pneumonia UTI, POA.  It quickly resolved after initiation of IV antibiotics.   04/07/2021: Patient was seen at her bedside.  There were no acute events overnight.  She has no new complaints.    Hospital Course:  Principal Problem:   Acute respiratory failure with hypoxia (HCC) Active Problems:   COPD (chronic obstructive pulmonary disease) (HCC)   Chronic atrial fibrillation (HCC)   Hypokalemia   Hypothyroidism   Hyperlipidemia   Dementia without behavioral disturbance (HCC)   Acute respiratory failure with hypoxemia (HCC)  Fall at the facility -Reportedly fell out of bed to the floor without loss of consciousness -No significant external or bone injury in a skeletal survey -Continue PT OT, fall precautions   Acute on chronic diastolic CHF Per her daughter via phone she has a history of congestive heart failure, unspecified. On presentation she was found to have pulmonary edema and hypoxia. Personally reviewed chest x-ray done on admission which shows increased of pulmonary vascularity suggestive of pulmonary edema. Received IV diuresing.  Due to adequate diuresing, was switched to oral Lasix by cardiology on 04/06/2021. 2D echo done on 04/06/2021, revealed preserved EF, abnormal MVP. Continue medications as recommended by cardiology. Follow-up with cardiology   Acute respiratory failure with hypoxia secondary to pulmonary edema seen on chest x-ray. -Not on supplemental oxygen at home.   -CT chest  negative for PE, nonspecific changes seen suspicious for early pulmonary edema or viral pneumonia.  No fever.  WBC count, lactic acid level normal.   Maintain O2 saturation greater  than 90%. Requiring 2 L at the time of discharge. Follow-up with your PCP   Resolved acute metabolic encephalopathy, possibly secondary to Klebsiella Pneumoniae UTI, POA. Repeat CT head on 04/05/2021 no acute intracranial abnormality Continue fall precautions   Klebsiella pneumonia UTI, POA Urine culture growing greater than 100,000 colonies of Klebsiella pneumonia Resistant to ampicillin, Macrobid. Discharged on Keflex 500 mg 3 times daily x5 days. Florastor 250 mg twice daily x8 days.   Chronic atrial fibrillation, rate controlled Currently rate controlled Continue digoxin at a lower dose of 0.125 mg as recommended by cardiology Continue Xarelto for CVA prevention.    Follow-up with cardiology  Resolved: Post repletion refractory hypokalemia   Chronic hypotension on Florinef and midodrine prior to admission. BP is currently stable off midodrine Florinef DC'd on 04/06/2021 per cardiologist recommendation to avoid fluid retention. Midodrine home dose increased to 5 mg twice daily, continue, as recommended by cardiology. Maintain MAP greater than 65 Follow-up with cardiology.   Hypothyroidism Continue Armour thyroid supplement.   Hyperlipidemia Continue Zetia.   Mild dementia without behavioral disturbance. Continue non pharmacological delirium precautions Continue home Lexapro.   Physical debility/ambulatory dysfunction PT assessed and recommended home health PT OT Continue PT OT with assistance and fall precautions         Living condition: Lives in ALF   Goals of care:   Code Status: Full Code    Nutritional status: Body mass index is 22.02 kg/m. Diet:  Diet Order                  Diet Heart Room service appropriate? Yes; Fluid consistency: Thin  Diet effective now                       DVT prophylaxis:    Rivaroxaban (XARELTO) tablet 15 mg    Consultants: Cardiology.    Procedures: 2D echo  Consultations: Cardiology  Discharge  Exam: BP 131/71   Pulse 86   Temp (!) 97.4 F (36.3 C) (Oral)   Resp (!) 25   Ht 5\' 2"  (1.575 m)   Wt 54.5 kg   SpO2 (!) 89%   BMI 21.98 kg/m  General: 86 y.o. year-old female well developed well nourished in no acute distress.  Alert and interactive. Cardiovascular: Regular rate and rhythm with no rubs or gallops.  No thyromegaly or JVD noted.   Respiratory: Clear to auscultation with no wheezes or rales. Good inspiratory effort. Abdomen: Soft nontender nondistended with normal bowel sounds x4 quadrants. Musculoskeletal: No lower extremity edema bilaterally. Psychiatry: Mood is appropriate for condition and setting  Discharge Instructions You were cared for by a hospitalist during your hospital stay. If you have any questions about your discharge medications or the care you received while you were in the hospital after you are discharged, you can call the unit and asked to speak with the hospitalist on call if the hospitalist that took care of you is not available. Once you are discharged, your primary care physician will handle any further medical issues. Please note that NO REFILLS for any discharge medications will be authorized once you are discharged, as it is imperative that you return to your primary care physician (or establish a relationship with a primary care physician if you do not have one) for your  aftercare needs so that they can reassess your need for medications and monitor your lab values.   Allergies as of 04/07/2021       Reactions   Codeine         Medication List     STOP taking these medications    fludrocortisone 0.1 MG tablet Commonly known as: FLORINEF       TAKE these medications    acetaminophen 325 MG tablet Commonly known as: TYLENOL Take 650 mg by mouth every 6 (six) hours as needed for moderate pain or headache.   cephALEXin 500 MG capsule Commonly known as: KEFLEX Take 1 capsule (500 mg total) by mouth every 8 (eight) hours for 5  days.   digoxin 0.125 MG tablet Commonly known as: LANOXIN Take 1 tablet (0.125 mg total) by mouth daily. Start taking on: April 08, 2021 What changed:  medication strength how much to take   escitalopram 5 MG tablet Commonly known as: LEXAPRO Take 5 mg by mouth daily.   ezetimibe 10 MG tablet Commonly known as: ZETIA Take 10 mg by mouth at bedtime.   furosemide 40 MG tablet Commonly known as: LASIX Take 1 tablet (40 mg total) by mouth daily.   midodrine 5 MG tablet Commonly known as: PROAMATINE Take 1 tablet (5 mg total) by mouth 2 (two) times daily with a meal. What changed:  medication strength how much to take when to take this   polyethylene glycol powder 17 GM/SCOOP powder Commonly known as: GLYCOLAX/MIRALAX Take 17 g by mouth daily.   potassium chloride 20 MEQ packet Commonly known as: KLOR-CON Take 20 mEq by mouth daily. Start taking on: April 08, 2021   Rivaroxaban 15 MG Tabs tablet Commonly known as: XARELTO Take 15 mg by mouth daily.   saccharomyces boulardii 250 MG capsule Commonly known as: FLORASTOR Take 1 capsule (250 mg total) by mouth 2 (two) times daily for 8 days.   thyroid 30 MG tablet Commonly known as: ARMOUR Take 30 mg by mouth daily before breakfast.   traZODone 50 MG tablet Commonly known as: DESYREL Take 50 mg by mouth at bedtime.   Trelegy Ellipta 200-62.5-25 MCG/ACT Aepb Generic drug: Fluticasone-Umeclidin-Vilant Inhale 1 puff into the lungs daily.       Allergies  Allergen Reactions   Codeine     Follow-up Information     Roetta Sessions, NP. Call today.   Specialty: Nurse Practitioner Why: Please call for a posthospital follow-up appointment. Contact information: [address] [place] [address] [phone]         O'Neal, Cassie Freer, MD. Call today.   Specialties: Cardiology, Internal Medicine, Radiology Why: Please call for a posthospital follow-up appointment. Contact  information: [place] [address] [phone]                  The results of significant diagnostics from this hospitalization (including imaging, microbiology, ancillary and laboratory) are listed below for reference.    Significant Diagnostic Studies: CT HEAD WO CONTRAST (5MM)  Result Date: 04/05/2021 CLINICAL DATA:  Mental status change, unknown cause. EXAM: CT HEAD WITHOUT CONTRAST TECHNIQUE: Contiguous axial images were obtained from the base of the skull through the vertex without intravenous contrast. COMPARISON:  Head CT 04/03/2021. FINDINGS: Brain: Mild generalized cerebral and cerebellar atrophy. Redemonstrated focus of chronic encephalomalacia/gliosis within the anterolateral left frontal lobe, which may be posttraumatic in etiology or may reflect a small remote infarct. Mild patchy and ill-defined hypoattenuation  within the cerebral white matter, nonspecific but compatible with chronic small vessel ischemic disease. There is no acute intracranial hemorrhage. No acute demarcated cortical infarct. No extra-axial fluid collection. No evidence of an intracranial mass. No midline shift. Vascular: No hyperdense vessel.  Atherosclerotic calcifications. Skull: Normal. Negative for fracture or focal lesion. Sinuses/Orbits: Visualized orbits show no acute finding. No significant paranasal sinus disease at the imaged levels. Other: Trace fluid within the right mastoid air cells. IMPRESSION: No evidence of acute intracranial abnormality. Redemonstrated focus of chronic encephalomalacia/gliosis within the anterolateral left frontal lobe, which may be posttraumatic in etiology or may reflect a small remote infarct. Mild chronic small vessel ischemic changes within the cerebral white matter. Mild generalized parenchymal atrophy. Trace right mastoid effusion. Electronically Signed   By: Jackey LogeKyle  Golden D.O.   On: 04/05/2021 12:30   CT HEAD WO CONTRAST  Result Date:  04/03/2021 CLINICAL DATA:  Trauma.  Pain.  Patient on blood thinners. EXAM: CT HEAD WITHOUT CONTRAST CT CERVICAL SPINE WITHOUT CONTRAST TECHNIQUE: Multidetector CT imaging of the head and cervical spine was performed following the standard protocol without intravenous contrast. Multiplanar CT image reconstructions of the cervical spine were also generated. COMPARISON:  None. FINDINGS: CT HEAD FINDINGS Brain: No evidence of acute infarction, hemorrhage, hydrocephalus, extra-axial collection or mass lesion/mass effect. Vascular: Calcified atherosclerosis in the intracranial carotids. Skull: Normal. Negative for fracture or focal lesion. Sinuses/Orbits: No acute finding. Other: None. CT CERVICAL SPINE FINDINGS Alignment: Normal. Skull base and vertebrae: No acute fracture. No primary bone lesion or focal pathologic process. Soft tissues and spinal canal: No prevertebral fluid or swelling. No visible canal hematoma. Disc levels:  Multilevel degenerative disc disease. Upper chest: No acute abnormalities. Other: No other abnormalities. IMPRESSION: 1. No acute intracranial abnormalities. 2. No cervical spine fracture or traumatic malalignment. Electronically Signed   By: Gerome Samavid  Williams III M.D.   On: 04/03/2021 18:53   CT Chest W Contrast  Result Date: 04/03/2021 CLINICAL DATA:  Larey SeatFell, anticoagulated, cough EXAM: CT CHEST WITH CONTRAST TECHNIQUE: Multidetector CT imaging of the chest was performed during intravenous contrast administration. CONTRAST:  75mL OMNIPAQUE IOHEXOL 300 MG/ML  SOLN COMPARISON:  04/03/2021 FINDINGS: Cardiovascular: The heart is enlarged, with prominent biatrial dilatation. No pericardial effusion. No evidence of thoracic aortic aneurysm or dissection. Mild atherosclerosis. Mediastinum/Nodes: Borderline enlarged mediastinal and hilar lymph nodes are identified, measuring up to 11 mm in the precarinal region. These are likely reactive. Thyroid, trachea, and esophagus are unremarkable.  Lungs/Pleura: There is diffuse interlobular septal thickening, with scattered bilateral areas of nodular airspace disease. This could reflect early edema or atypical infection such as viral pneumonia. No effusion or pneumothorax. The central airways are patent. Upper Abdomen: No acute abnormality. Multiple hepatic hypodensities compatible with cysts. Musculoskeletal: No acute or destructive bony lesions. Reconstructed images demonstrate no additional findings. IMPRESSION: 1. Diffuse interlobular septal thickening with patchy areas of airspace disease bilaterally. Viral pneumonia or early edema could give this appearance. 2. Marked cardiomegaly. 3.  Aortic Atherosclerosis (ICD10-I70.0). Electronically Signed   By: Sharlet SalinaMichael  Brown M.D.   On: 04/03/2021 22:39   CT CERVICAL SPINE WO CONTRAST  Result Date: 04/03/2021 CLINICAL DATA:  Trauma.  Pain.  Patient on blood thinners. EXAM: CT HEAD WITHOUT CONTRAST CT CERVICAL SPINE WITHOUT CONTRAST TECHNIQUE: Multidetector CT imaging of the head and cervical spine was performed following the standard protocol without intravenous contrast. Multiplanar CT image reconstructions of the cervical spine were also generated. COMPARISON:  None. FINDINGS: CT HEAD FINDINGS Brain: No  evidence of acute infarction, hemorrhage, hydrocephalus, extra-axial collection or mass lesion/mass effect. Vascular: Calcified atherosclerosis in the intracranial carotids. Skull: Normal. Negative for fracture or focal lesion. Sinuses/Orbits: No acute finding. Other: None. CT CERVICAL SPINE FINDINGS Alignment: Normal. Skull base and vertebrae: No acute fracture. No primary bone lesion or focal pathologic process. Soft tissues and spinal canal: No prevertebral fluid or swelling. No visible canal hematoma. Disc levels:  Multilevel degenerative disc disease. Upper chest: No acute abnormalities. Other: No other abnormalities. IMPRESSION: 1. No acute intracranial abnormalities. 2. No cervical spine fracture or  traumatic malalignment. Electronically Signed   By: Dorise Bullion III M.D.   On: 04/03/2021 18:53   DG Pelvis Portable  Result Date: 04/03/2021 CLINICAL DATA:  Fall.  Patient on blood thinners. EXAM: PORTABLE PELVIS 1-2 VIEWS COMPARISON:  None. FINDINGS: There mild symmetric degenerative changes of the hips. There is no evidence of fracture or dislocation. Degenerative change of the spine. IMPRESSION: No acute findings. Electronically Signed   By: Marin Olp M.D.   On: 04/03/2021 18:25   DG Chest Port 1 View  Result Date: 04/03/2021 CLINICAL DATA:  Fall.  Patient on blood thinners. EXAM: PORTABLE CHEST 1 VIEW COMPARISON:  None. FINDINGS: Lungs are adequately inflated and demonstrate bilateral diffuse hazy interstitial prominence which may be acute or chronic. No lobar consolidation or effusion. No pneumothorax. Borderline cardiomegaly. No evidence of fracture. IMPRESSION: 1. No acute fracture. 2. Diffuse hazy interstitial prominence which may be acute or chronic. Acute infection is possible. 3. Borderline cardiomegaly. Electronically Signed   By: Marin Olp M.D.   On: 04/03/2021 18:25   DG Hand Complete Left  Result Date: 04/03/2021 CLINICAL DATA:  Fall.  Patient on blood thinners. EXAM: LEFT HAND - COMPLETE 3+ VIEW COMPARISON:  None. FINDINGS: There mild degenerate changes over the radiocarpal joint, carpal bones and first metacarpal joints. There mild degenerate changes over the MCP joints and interphalangeal joints. Bone alignment and mineralization is normal. There are no bony erosions. No acute fracture or dislocation. IMPRESSION: 1. No acute findings. 2. Degenerative changes as described. Electronically Signed   By: Marin Olp M.D.   On: 04/03/2021 18:27   ECHOCARDIOGRAM COMPLETE  Result Date: 04/06/2021    ECHOCARDIOGRAM REPORT   Patient Name:   ARTHUR DECOOK Date of Exam: 04/06/2021 Medical Rec #:  [id number]       Height:       62.0 in Accession #:    [id number]      Weight:        120.4 lb Date of Birth:  16-Dec-1934      BSA:          1.541 m Patient Age:    40 years        BP:           118/64 mmHg Patient Gender: F               HR:           83 bpm. Exam Location:  Inpatient Procedure: 2D Echo Indications:    congestive heart failure  History:        Patient has no prior history of Echocardiogram examinations.                 COPD, Arrythmias:Atrial Fibrillation; Risk Factors:Dyslipidemia.  Sonographer:    Johny Chess RDCS Referring Phys: [id number] [place]  1. Left ventricular ejection fraction, by estimation, is 55 to 60%. The left ventricle has normal  function. The left ventricle has no regional wall motion abnormalities. There is mild left ventricular hypertrophy. Left ventricular diastolic parameters are indeterminate.  2. Right ventricular systolic function is mildly reduced. The right ventricular size is normal. There is normal pulmonary artery systolic pressure. The estimated right ventricular systolic pressure is 29.6 mmHg.  3. Left atrial size was mildly dilated.  4. Right atrial size was mildly dilated.  5. 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 Mild to moderate mitral valve regurgitation. No evidence of mitral stenosis.  6. Tricuspid valve regurgitation is mild to moderate.  7. The aortic valve is grossly normal. Aortic valve regurgitation is not visualized. No aortic stenosis is present.  8. The inferior vena cava is normal in size with greater than 50% respiratory variability, suggesting right atrial pressure of 3 mmHg. FINDINGS  Left Ventricle: Left ventricular ejection fraction, by estimation, is 55 to 60%. The left ventricle has normal function. The left ventricle has no regional wall motion abnormalities. The left ventricular internal cavity size was normal in size. There is  mild left ventricular hypertrophy. Left ventricular diastolic parameters are indeterminate. Right Ventricle: The right ventricular size is  normal. Right vetricular wall thickness was not well visualized. Right ventricular systolic function is mildly reduced. There is normal pulmonary artery systolic pressure. The tricuspid regurgitant velocity is 2.58 m/s, and with an assumed right atrial pressure of 3 mmHg, the estimated right ventricular systolic pressure is 29.6 mmHg. Left Atrium: Left atrial size was mildly dilated. Right Atrium: Right atrial size was mildly dilated. Pericardium: Trivial pericardial effusion is present. 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 Mild to moderate mitral valve regurgitation. No evidence of mitral valve stenosis. Tricuspid Valve: The tricuspid valve is grossly normal. Tricuspid valve regurgitation is mild to moderate. No evidence of tricuspid stenosis. Aortic Valve: The aortic valve is grossly normal. Aortic valve regurgitation is not visualized. No aortic stenosis is present. Pulmonic Valve: The pulmonic valve was grossly normal. Pulmonic valve regurgitation is mild. No evidence of pulmonic stenosis. Aorta: The aortic root is normal in size and structure. Venous: The inferior vena cava is normal in size with greater than 50% respiratory variability, suggesting right atrial pressure of 3 mmHg. IAS/Shunts: No atrial level shunt detected by color flow Doppler.  LEFT VENTRICLE PLAX 2D LVIDd:         3.70 cm LVIDs:         2.60 cm LV PW:         1.00 cm LV IVS:        1.10 cm LVOT diam:     1.70 cm LVOT Area:     2.27 cm  IVC IVC diam: 1.00 cm LEFT ATRIUM             Index        RIGHT ATRIUM           Index LA diam:        4.10 cm 2.66 cm/m   RA Area:     14.70 cm LA Vol (A2C):   48.6 ml 31.55 ml/m  RA Volume:   33.60 ml  21.81 ml/m LA Vol (A4C):   50.3 ml 32.65 ml/m LA Biplane Vol: 49.7 ml 32.26 ml/m   AORTA Ao Root diam: 2.90 cm Ao Asc diam:  3.30 cm TRICUSPID VALVE TR Peak grad:   26.6 mmHg TR Vmax:        258.00 cm/s  SHUNTS Systemic Diam: 1.70 cm  Cherlynn Kaiser MD  Electronically signed by Cherlynn Kaiser MD Signature Date/Time: 04/06/2021/12:32:50 PM    Final     Microbiology: Recent Results (from the past 240 hour(s))  Resp Panel by RT-PCR (Flu A&B, Covid) Nasopharyngeal Swab     Status: None   Collection Time: 04/03/21  6:06 PM   Specimen: Nasopharyngeal Swab; Nasopharyngeal(NP) swabs in vial transport medium  Result Value Ref Range Status   SARS Coronavirus 2 by RT PCR NEGATIVE NEGATIVE Final    Comment: (NOTE) SARS-CoV-2 target nucleic acids are NOT DETECTED.  The SARS-CoV-2 RNA is generally detectable in upper respiratory specimens during the acute phase of infection. The lowest concentration of SARS-CoV-2 viral copies this assay can detect is 138 copies/mL. A negative result does not preclude SARS-Cov-2 infection and should not be used as the sole basis for treatment or other patient management decisions. A negative result may occur with  improper specimen collection/handling, submission of specimen other than nasopharyngeal swab, presence of viral mutation(s) within the areas targeted by this assay, and inadequate number of viral copies(<138 copies/mL). A negative result must be combined with clinical observations, patient history, and epidemiological information. The expected result is Negative.  Fact Sheet for Patients:  EntrepreneurPulse.com.au  Fact Sheet for Healthcare Providers:  IncredibleEmployment.be  This test is no t yet approved or cleared by the [place] FDA and  has been authorized for detection and/or diagnosis of SARS-CoV-2 by FDA under an Emergency Use Authorization (EUA). This EUA will remain  in effect (meaning this test can be used) for the duration of the COVID-19 declaration under Section 564(b)(1) of the Act, 21 U.S.C.section 360bbb-3(b)(1), unless the authorization is terminated  or revoked sooner.       Influenza A by PCR NEGATIVE NEGATIVE Final   Influenza B by  PCR NEGATIVE NEGATIVE Final    Comment: (NOTE) The Xpert Xpress SARS-CoV-2/FLU/RSV plus assay is intended as an aid in the diagnosis of influenza from Nasopharyngeal swab specimens and should not be used as a sole basis for treatment. Nasal washings and aspirates are unacceptable for Xpert Xpress SARS-CoV-2/FLU/RSV testing.  Fact Sheet for Patients: EntrepreneurPulse.com.au  Fact Sheet for Healthcare Providers: IncredibleEmployment.be  This test is not yet approved or cleared by the [place] FDA and has been authorized for detection and/or diagnosis of SARS-CoV-2 by FDA under an Emergency Use Authorization (EUA). This EUA will remain in effect (meaning this test can be used) for the duration of the COVID-19 declaration under Section 564(b)(1) of the Act, 21 U.S.C. section 360bbb-3(b)(1), unless the authorization is terminated or revoked.  Performed at [facility], [place] [address], [place], [place] [address]   Urine Culture     Status: Abnormal   Collection Time: 04/03/21  9:19 PM   Specimen: Urine, Clean Catch  Result Value Ref Range Status   Specimen Description URINE, CLEAN CATCH  Final   Special Requests NONE  Final   Culture (A)  Final    >=100,000 COLONIES/mL KLEBSIELLA PNEUMONIAE Two isolates with different morphologies were identified as the same organism.The most resistant organism was reported. Performed at [facility], [place] [address] [place], [place] [address]    Report Status 04/07/2021 FINAL  Final   Organism ID, Bacteria KLEBSIELLA PNEUMONIAE (A)  Final      Susceptibility   Klebsiella pneumoniae - MIC*    AMPICILLIN >=32 RESISTANT Resistant     CEFAZOLIN <=4 SENSITIVE Sensitive     CEFEPIME <=0.12 SENSITIVE Sensitive  CEFTRIAXONE <=0.25 SENSITIVE Sensitive     CIPROFLOXACIN <=0.25 SENSITIVE Sensitive     GENTAMICIN <=1 SENSITIVE Sensitive     IMIPENEM <=0.25 SENSITIVE Sensitive     NITROFURANTOIN 128  RESISTANT Resistant     TRIMETH/SULFA <=20 SENSITIVE Sensitive     AMPICILLIN/SULBACTAM 8 SENSITIVE Sensitive     PIP/TAZO <=4 SENSITIVE Sensitive     * >=100,000 COLONIES/mL KLEBSIELLA PNEUMONIAE  MRSA Next Gen by PCR, Nasal     Status: None   Collection Time: 04/04/21  3:31 AM   Specimen: Nasal Mucosa; Nasal Swab  Result Value Ref Range Status   MRSA by PCR Next Gen NOT DETECTED NOT DETECTED Final    Comment: (NOTE) The GeneXpert MRSA Assay (FDA approved for NASAL specimens only), is one component of a comprehensive MRSA colonization surveillance program. It is not intended to diagnose MRSA infection nor to guide or monitor treatment for MRSA infections. Test performance is not FDA approved in patients less than 30 years old. Performed at [facility], [place] [address] [place], [place] [address]      Labs: Basic Metabolic Panel: Recent Labs  Lab 04/03/21 1806 04/03/21 1821 04/04/21 0125 04/04/21 1129 04/05/21 0047 04/06/21 0041  NA 138 140 137  --  135 136  K 3.0* 3.3* 2.8* 3.3* 3.4* 3.6  CL 100 101 101  --  97* 98  CO2 28  --  26  --  28 28  GLUCOSE 100* 99 125*  --  119* 120*  BUN 5* 6* <5*  --  6* 11  CREATININE 0.77 0.70 0.72  --  0.76 0.92  CALCIUM 8.9  --  8.4*  --  8.9 8.7*  MG  --   --  1.8  --   --  1.9   Liver Function Tests: Recent Labs  Lab 04/03/21 1806  AST 16  ALT 9  ALKPHOS 59  BILITOT 0.9  PROT 7.4  ALBUMIN 3.5   No results for input(s): LIPASE, AMYLASE in the last 168 hours. No results for input(s): AMMONIA in the last 168 hours. CBC: Recent Labs  Lab 04/03/21 1806 04/03/21 1821 04/04/21 0125 04/05/21 0047 04/06/21 0041  WBC 10.3  --  11.1* 10.9* 10.5  NEUTROABS  --   --   --  7.1 6.7  HGB 13.2 14.3 12.4 13.0 12.9  HCT 41.9 42.0 38.6 39.7 39.9  MCV 95.4  --  95.1 93.6 95.0  PLT 291  --  271 277 283   Cardiac Enzymes: No results for input(s): CKTOTAL, CKMB, CKMBINDEX, TROPONINI in the last 168 hours. BNP: BNP (last 3  results) Recent Labs    04/04/21 0125 04/07/21 0026  BNP 279.4* 81.0    ProBNP (last 3 results) No results for input(s): PROBNP in the last 8760 hours.  CBG: Recent Labs  Lab 04/03/21 1804  GLUCAP 101*       Signed:  Kayleen Memos, MD [facility] 04/07/2021, 3:08 PM

## 2021-04-07 NOTE — TOC Transition Note (Addendum)
Transition of Care [facility]) - CM/SW Discharge Note   Patient Details  Name: Lindsay Rush MRN: [id number] Date of Birth: 03-07-1935  [facility] CM/SW Contact:  Lynett Grimes Phone Number: 04/07/2021, 3:35 PM   Clinical Narrative:    Patient will DC to: [facility] Anticipated DC date: 04/08/2021 Family notified: Pt son Transport by: Sharin Mons   Per MD patient ready for DC to [facility]. RN to call report prior to discharge [phone]). RN, patient, patient's family, and facility notified of DC. Discharge Summary and FL2 sent to facility. DC packet on chart. Ambulance transport requested for patient.   CSW will sign off for now as social work intervention is no longer needed. Please consult [place] again if new needs arise.       Barriers to Discharge: Continued Medical Work up   Patient Goals and CMS Choice        Discharge Placement                       Discharge Plan and Services     Post Acute Care Choice: Home Health                    HH Arranged: PT          Social Determinants of Health (SDOH) Interventions     Readmission Risk Interventions No flowsheet data found.

## 2021-04-07 NOTE — Care Management Important Message (Signed)
Important Message  Patient Details  Name: Lindsay Rush MRN: [id number] Date of Birth: May 04, 1935   Medicare Important Message Given:  Yes     Magda Muise Stefan Church 04/07/2021, 2:58 PM

## 2021-04-07 NOTE — Progress Notes (Signed)
Discharge to [facility] assisted living cancelled due to  said facility has to do in -person assessment  in the hospital prior to discharge.  MD aware.

## 2021-04-08 DIAGNOSIS — J9601 Acute respiratory failure with hypoxia: Secondary | ICD-10-CM | POA: Diagnosis not present

## 2021-04-08 NOTE — Plan of Care (Signed)
  Problem: Education: Goal: Knowledge of General Education information will improve Description: Including pain rating scale, medication(s)/side effects and non-pharmacologic comfort measures Outcome: Progressing   Problem: Health Behavior/Discharge Planning: Goal: Ability to manage health-related needs will improve Outcome: Progressing   Problem: Clinical Measurements: Goal: Ability to maintain clinical measurements within normal limits will improve Outcome: Progressing Goal: Will remain free from infection Outcome: Progressing Goal: Diagnostic test results will improve Outcome: Progressing Goal: Respiratory complications will improve Outcome: Progressing Goal: Cardiovascular complication will be avoided Outcome: Progressing   Problem: Activity: Goal: Risk for activity intolerance will decrease Outcome: Progressing   Problem: Nutrition: Goal: Adequate nutrition will be maintained Outcome: Progressing   Problem: Coping: Goal: Level of anxiety will decrease Outcome: Progressing   Problem: Elimination: Goal: Will not experience complications related to bowel motility Outcome: Progressing Goal: Will not experience complications related to urinary retention Outcome: Progressing   Problem: Pain Managment: Goal: General experience of comfort will improve Outcome: Progressing   Problem: Safety: Goal: Ability to remain free from injury will improve Outcome: Progressing   Problem: Skin Integrity: Goal: Risk for impaired skin integrity will decrease Outcome: Progressing   Problem: Education: Goal: Ability to demonstrate management of disease process will improve Outcome: Progressing Goal: Ability to verbalize understanding of medication therapies will improve Outcome: Progressing Goal: Individualized Educational Video(s) Outcome: Progressing   Problem: Activity: Goal: Capacity to carry out activities will improve Outcome: Progressing   Problem: Cardiac: Goal:  Ability to achieve and maintain adequate cardiopulmonary perfusion will improve Outcome: Progressing   Problem: Education: Goal: Ability to demonstrate management of disease process will improve Outcome: Progressing Goal: Ability to verbalize understanding of medication therapies will improve Outcome: Progressing Goal: Individualized Educational Video(s) Outcome: Progressing   Problem: Activity: Goal: Capacity to carry out activities will improve Outcome: Progressing   Problem: Cardiac: Goal: Ability to achieve and maintain adequate cardiopulmonary perfusion will improve Outcome: Progressing   

## 2021-04-08 NOTE — Progress Notes (Signed)
Physical Therapy Treatment Patient Details Name: Lindsay Rush MRN: [id number] DOB: 1935-03-29 Today's Date: 04/08/2021   History of Present Illness Lindsay Rush is a 86 y.o. female admitted on 04/03/21 after a fall. Workup for acute respiratory failure with hypoxia. Significant neuro changes on 11/26 - CT scan negative. PMH includes chronic atrial fibrillation on Xarelto, COPD, CHF, hypothyroidism, hyperlipidemia, dementia.    PT Comments    Pt progressing slowly with mobility. Today's session focused on transfer training, as pt declined ambulation attempts due to fear of falling. Pt requiring min-mod A and max verbal cues for sequencing and anterior weight translation. Pt limited by poor balance, decreased strength, and impaired cognition. Continue to recommend return to ALF with staff assistance and HHPT. Will continue to follow acutely to address short-term PT goals.    Recommendations for follow up therapy are one component of a multi-disciplinary discharge planning process, led by the attending physician.  Recommendations may be updated based on patient status, additional functional criteria and insurance authorization.  Follow Up Recommendations  Home health PT     Assistance Recommended at Discharge Frequent or constant Supervision/Assistance  Equipment Recommendations  None recommended by PT    Recommendations for Other Services       Precautions / Restrictions Precautions Precautions: Fall Restrictions Weight Bearing Restrictions: No     Mobility  Bed Mobility Overal bed mobility: Needs Assistance Bed Mobility: Supine to Sit     Supine to sit: Mod assist;HOB elevated     General bed mobility comments: mod A for trunk elevation and LE advancement to EOB    Transfers Overall transfer level: Needs assistance Equipment used: Rolling walker (2 wheels) Transfers: Sit to/from Stand Sit to Stand: Min assist     Step pivot transfers: Min assist;Mod assist      General transfer comment: increased time to rise from EOB, min A for balance and cues for hand placement with RW; stepping to recliner with anywhere from min A for balance, RW management. Mod A at times to prevent premature sitting. Max verbal cues needed for anterior weight translation and sequencing    Ambulation/Gait               General Gait Details: Did not ambulate due to pt's fear of falling and multiple attempts to sit prematurely. Pt reports she uses a wheelchair all the time at baseline.   Stairs             Wheelchair Mobility    Modified Rankin (Stroke Patients Only)       Balance Overall balance assessment: Needs assistance;History of Falls Sitting-balance support: Bilateral upper extremity supported;Feet supported Sitting balance-Leahy Scale: Poor Sitting balance - Comments: pt able to sit EOB with UE support; required assistance to scoot hips to EOB   Standing balance support: Bilateral upper extremity supported Standing balance-Leahy Scale: Poor Standing balance comment: reliant on BUE with RW for static standing and mobility; min-mod A for balance and to prevent premature sitting                            Cognition Arousal/Alertness: Awake/alert Behavior During Therapy: WFL for tasks assessed/performed Overall Cognitive Status: No family/caregiver present to determine baseline cognitive functioning Area of Impairment: Attention;Memory;Following commands;Safety/judgement;Awareness                 Orientation Level: Time Current Attention Level: Selective;Sustained Memory: Decreased short-term memory Following Commands: Follows one step commands with increased time  Safety/Judgement: Decreased awareness of safety;Decreased awareness of deficits Awareness: Emergent Problem Solving: Slow processing;Difficulty sequencing;Requires verbal cues;Requires tactile cues General Comments: Alert and oriented x3 with increased time to  answer orientation questions; followed cues with increased time, very worried about falling for the duration of session, max verbal and tactile cues needed        Exercises      General Comments General comments (skin integrity, edema, etc.): spoke with pt about assistance she will need after discharge. She said she had people to help her move and transfer, but seemed unsure of her answer.      Pertinent Vitals/Pain Pain Assessment: No/denies pain    Home Living                          Prior Function            PT Goals (current goals can now be found in the care plan section) Acute Rehab PT Goals Patient Stated Goal: To return to ALF with HHPT PT Goal Formulation: With patient Time For Goal Achievement: 04/18/21 Potential to Achieve Goals: Good Progress towards PT goals: Progressing toward goals    Frequency    Min 3X/week      PT Plan Current plan remains appropriate    Co-evaluation              AM-PAC PT "6 Clicks" Mobility   Outcome Measure  Help needed turning from your back to your side while in a flat bed without using bedrails?: A Lot Help needed moving from lying on your back to sitting on the side of a flat bed without using bedrails?: A Lot Help needed moving to and from a bed to a chair (including a wheelchair)?: A Lot Help needed standing up from a chair using your arms (e.g., wheelchair or bedside chair)?: A Little Help needed to walk in hospital room?: Total Help needed climbing 3-5 steps with a railing? : Total 6 Click Score: 11    End of Session Equipment Utilized During Treatment: Gait belt Activity Tolerance: Patient tolerated treatment well Patient left: in chair;with call bell/phone within reach;with chair alarm set Nurse Communication: Mobility status (pt needs wash-up) PT Visit Diagnosis: History of falling (Z91.81);Other abnormalities of gait and mobility (R26.89);Unsteadiness on feet (R26.81);Muscle weakness  (generalized) (M62.81)     Time: 5638-7564 PT Time Calculation (min) (ACUTE ONLY): 27 min  Charges:  $Therapeutic Activity: 23-37 mins                     Daryel November, SPT    Daryel November 04/08/2021, 11:21 AM

## 2021-04-08 NOTE — Discharge Summary (Signed)
Ox Discharge Summary  Lindsay Rush [id number] DOB: 1934-11-26  PCP: Lindsay Sessions, NP  Admit date: 04/03/2021 Discharge date: 04/08/2021  Time spent: 35 minutes  Recommendations for Outpatient Follow-up:  Follow-up with cardiology Follow-up with your primary care provider Take your medications as prescribed Continue PT OT with assistance and fall precautions.  Discharge Diagnoses:  Active Hospital Problems   Diagnosis Date Noted   Acute respiratory failure with hypoxia ([place]) 04/03/2021   Acute respiratory failure with hypoxemia (HCC) 04/04/2021   COPD (chronic obstructive pulmonary disease) (HCC)    Chronic atrial fibrillation (HCC)    Hypokalemia    Hypothyroidism    Hyperlipidemia    Dementia without behavioral disturbance [facility])     Resolved Hospital Problems  No resolved problems to display.    Discharge Condition: Stable  Diet recommendation: Resume previous diet.  Vitals:   04/08/21 0755 04/08/21 0814  BP: 135/86   Pulse: 81   Resp: (!) 24   Temp: 97.6 F (36.4 C)   SpO2:  90%    History of present illness:  Lindsay Rush is a 86 y.o. female with PMH significant for mild dementia without behavioral disturbance, HTN, HLD, chronic A. fib on Xarelto and digoxin, unspecified CHF, COPD, hypothyroidism who lives at [facility] assisted living. Patient was brought into the ED on 04/03/21 for a fall.  CT head without contrast negative for acute intracranial abnormalities. CT cervical spine without contrast negative for cervical spine fracture or traumatic malalignment. CT chest with contrast showed diffuse interlobular septal thickening with patchy areas of airspace disease bilaterally, marked cardiomegaly, aortic atherosclerosis.  She was hypoxic with O2 saturation in the 80s requiring 2 L nasal cannula to maintain O2 saturation greater than 90%.   Patient was given IV ceftriaxone and azithromycin due to concern for CAP.  She was admitted by the  hospitalist service.  Also found to have pulmonary edema and acute on chronic diastolic CHF.  She was diuresed and seen by cardiology.  She wants to continue to follow-up with Dr. Audie Box outpatient.  Hospital course complicated by an episode of acute metabolic encephalopathy which was thought to be secondary to Klebsiella pneumonia UTI, POA.  It quickly resolved after initiation of IV antibiotics.   04/08/2021: Patient was seen at her bedside.  There were no acute events overnight.  She is alert and interactive.  She has no new complaints.    Hospital Course:  Principal Problem:   Acute respiratory failure with hypoxia (HCC) Active Problems:   COPD (chronic obstructive pulmonary disease) (HCC)   Chronic atrial fibrillation (HCC)   Hypokalemia   Hypothyroidism   Hyperlipidemia   Dementia without behavioral disturbance (HCC)   Acute respiratory failure with hypoxemia (HCC)  Fall at the facility -Reportedly fell out of bed to the floor without loss of consciousness -No significant external or bone injury in a skeletal survey -Continue PT OT, fall precautions   Acute on chronic diastolic CHF Per her daughter via phone she has a history of congestive heart failure, unspecified. On presentation she was found to have pulmonary edema and hypoxia. Personally reviewed chest x-ray done on admission which shows increased of pulmonary vascularity suggestive of pulmonary edema. Received IV diuresing.  Due to adequate diuresing, was switched to oral Lasix by cardiology on 04/06/2021. 2D echo done on 04/06/2021, revealed preserved EF, abnormal MVP. Continue medications as recommended by cardiology. Follow-up with cardiology   Acute respiratory failure with hypoxia secondary to pulmonary edema seen on chest x-ray. -Not on  supplemental oxygen at home.   -CT chest negative for PE, nonspecific changes seen suspicious for early pulmonary edema or viral pneumonia.  No fever.  WBC count, lactic acid  level normal.   Maintain O2 saturation greater than 90%. Requiring 2 L at the time of discharge. Follow-up with your PCP   Resolved acute metabolic encephalopathy, possibly secondary to Klebsiella Pneumoniae UTI, POA. Repeat CT head on 04/05/2021 no acute intracranial abnormality Continue fall precautions   Klebsiella pneumonia UTI, POA Urine culture growing greater than 100,000 colonies of Klebsiella pneumonia Resistant to ampicillin, Macrobid. Discharged on Keflex 500 mg 3 times daily x5 days. Florastor 250 mg twice daily x8 days.   Chronic atrial fibrillation, rate controlled Currently rate controlled Continue digoxin at a lower dose of 0.125 mg as recommended by cardiology Continue Xarelto for CVA prevention.    Follow-up with cardiology  Resolved: Post repletion refractory hypokalemia   Chronic hypotension on Florinef and midodrine prior to admission. BP is currently stable off midodrine Florinef DC'd on 04/06/2021 per cardiologist recommendation to avoid fluid retention. Midodrine home dose increased to 5 mg twice daily, continue, as recommended by cardiology. Maintain MAP greater than 65 Follow-up with cardiology.   Hypothyroidism Continue Armour thyroid supplement.   Hyperlipidemia Continue Zetia.   Mild dementia without behavioral disturbance. Continue non pharmacological delirium precautions Continue home Lexapro.   Physical debility/ambulatory dysfunction PT assessed and recommended home health PT OT Continue PT OT with assistance and fall precautions         Living condition: Lives in ALF   Goals of care:   Code Status: Full Code    Nutritional status: Body mass index is 22.02 kg/m. Diet:  Diet Order                  Diet Heart Room service appropriate? Yes; Fluid consistency: Thin  Diet effective now                       DVT prophylaxis:    Rivaroxaban (XARELTO) tablet 15 mg    Consultants: Cardiology.    Procedures: 2D  echo  Consultations: Cardiology  Discharge Exam: BP 135/86 (BP Location: Right Arm)   Pulse 81   Temp 97.6 F (36.4 C) (Oral)   Resp (!) 24   Ht 5\' 2"  (1.575 m)   Wt 56 kg   SpO2 90%   BMI 22.58 kg/m  General: 86 y.o. year-old female well developed well nourished in no acute distress.  Alert and interactive. Cardiovascular: Regular rate and rhythm with no rubs or gallops.  No thyromegaly or JVD noted.   Respiratory: Clear to auscultation with no wheezes or rales. Good inspiratory effort. Abdomen: Soft nontender nondistended with normal bowel sounds x4 quadrants. Musculoskeletal: No lower extremity edema bilaterally. Psychiatry: Mood is appropriate for condition and setting  Discharge Instructions You were cared for by a hospitalist during your hospital stay. If you have any questions about your discharge medications or the care you received while you were in the hospital after you are discharged, you can call the unit and asked to speak with the hospitalist on call if the hospitalist that took care of you is not available. Once you are discharged, your primary care physician will handle any further medical issues. Please note that NO REFILLS for any discharge medications will be authorized once you are discharged, as it is imperative that you return to your primary care physician (or establish a relationship with a primary  care physician if you do not have one) for your aftercare needs so that they can reassess your need for medications and monitor your lab values.   Allergies as of 04/08/2021       Reactions   Codeine         Medication List     STOP taking these medications    fludrocortisone 0.1 MG tablet Commonly known as: FLORINEF       TAKE these medications    acetaminophen 325 MG tablet Commonly known as: TYLENOL Take 650 mg by mouth every 6 (six) hours as needed for moderate pain or headache.   cephALEXin 500 MG capsule Commonly known as: KEFLEX Take 1  capsule (500 mg total) by mouth every 8 (eight) hours for 5 days.   digoxin 0.125 MG tablet Commonly known as: LANOXIN Take 1 tablet (0.125 mg total) by mouth daily. What changed:  medication strength how much to take   escitalopram 5 MG tablet Commonly known as: LEXAPRO Take 5 mg by mouth daily.   ezetimibe 10 MG tablet Commonly known as: ZETIA Take 10 mg by mouth at bedtime.   furosemide 40 MG tablet Commonly known as: LASIX Take 1 tablet (40 mg total) by mouth daily.   midodrine 5 MG tablet Commonly known as: PROAMATINE Take 1 tablet (5 mg total) by mouth 2 (two) times daily with a meal. What changed:  medication strength how much to take when to take this   polyethylene glycol powder 17 GM/SCOOP powder Commonly known as: GLYCOLAX/MIRALAX Take 17 g by mouth daily.   potassium chloride 20 MEQ packet Commonly known as: KLOR-CON Take 20 mEq by mouth daily.   Rivaroxaban 15 MG Tabs tablet Commonly known as: XARELTO Take 15 mg by mouth daily.   saccharomyces boulardii 250 MG capsule Commonly known as: FLORASTOR Take 1 capsule (250 mg total) by mouth 2 (two) times daily for 8 days.   thyroid 30 MG tablet Commonly known as: ARMOUR Take 30 mg by mouth daily before breakfast.   traZODone 50 MG tablet Commonly known as: DESYREL Take 50 mg by mouth at bedtime.   Trelegy Ellipta 200-62.5-25 MCG/ACT Aepb Generic drug: Fluticasone-Umeclidin-Vilant Inhale 1 puff into the lungs daily.       Allergies  Allergen Reactions   Codeine     Follow-up Information     Lindsay Sessions, NP. Call today.   Specialty: Nurse Practitioner Why: Please call for a posthospital follow-up appointment. Contact information: [id number] [place] [address] [phone]         Sande Rives E, PA-C Follow up on 05/14/2021.   Specialties: Physician Assistant, Cardiology Why: @2 :20pm for hospital follow up with Dr. Kathalene Frames PA Contact  information: [address] [place] [address] [phone]         [place]. Go to.   Specialty: Cardiology Why: Tuesday, December 6 @ 10AM for HV TOC appt within [place]. Bring all medications with you.  FREE valet parking at [employer], off [address]. Contact information: [address] [id number] [place] [phone]                 The results of significant diagnostics from this hospitalization (including imaging, microbiology, ancillary and laboratory) are listed below for reference.    Significant Diagnostic Studies: CT HEAD WO CONTRAST (5MM)  Result Date: 04/05/2021 CLINICAL DATA:  Mental status change, unknown  cause. EXAM: CT HEAD WITHOUT CONTRAST TECHNIQUE: Contiguous axial images were obtained from the base of the skull through the vertex without intravenous contrast. COMPARISON:  Head CT 04/03/2021. FINDINGS: Brain: Mild generalized cerebral and cerebellar atrophy. Redemonstrated focus of chronic encephalomalacia/gliosis within the anterolateral left frontal lobe, which may be posttraumatic in etiology or may reflect a small remote infarct. Mild patchy and ill-defined hypoattenuation within the cerebral white matter, nonspecific but compatible with chronic small vessel ischemic disease. There is no acute intracranial hemorrhage. No acute demarcated cortical infarct. No extra-axial fluid collection. No evidence of an intracranial mass. No midline shift. Vascular: No hyperdense vessel.  Atherosclerotic calcifications. Skull: Normal. Negative for fracture or focal lesion. Sinuses/Orbits: Visualized orbits show no acute finding. No significant paranasal sinus disease at the imaged levels. Other: Trace fluid within the right mastoid air cells. IMPRESSION: No evidence of acute intracranial abnormality. Redemonstrated focus of chronic encephalomalacia/gliosis  within the anterolateral left frontal lobe, which may be posttraumatic in etiology or may reflect a small remote infarct. Mild chronic small vessel ischemic changes within the cerebral white matter. Mild generalized parenchymal atrophy. Trace right mastoid effusion. Electronically Signed   By: Kellie Simmering D.O.   On: 04/05/2021 12:30   CT HEAD WO CONTRAST  Result Date: 04/03/2021 CLINICAL DATA:  Trauma.  Pain.  Patient on blood thinners. EXAM: CT HEAD WITHOUT CONTRAST CT CERVICAL SPINE WITHOUT CONTRAST TECHNIQUE: Multidetector CT imaging of the head and cervical spine was performed following the standard protocol without intravenous contrast. Multiplanar CT image reconstructions of the cervical spine were also generated. COMPARISON:  None. FINDINGS: CT HEAD FINDINGS Brain: No evidence of acute infarction, hemorrhage, hydrocephalus, extra-axial collection or mass lesion/mass effect. Vascular: Calcified atherosclerosis in the intracranial carotids. Skull: Normal. Negative for fracture or focal lesion. Sinuses/Orbits: No acute finding. Other: None. CT CERVICAL SPINE FINDINGS Alignment: Normal. Skull base and vertebrae: No acute fracture. No primary bone lesion or focal pathologic process. Soft tissues and spinal canal: No prevertebral fluid or swelling. No visible canal hematoma. Disc levels:  Multilevel degenerative disc disease. Upper chest: No acute abnormalities. Other: No other abnormalities. IMPRESSION: 1. No acute intracranial abnormalities. 2. No cervical spine fracture or traumatic malalignment. Electronically Signed   By: Dorise Bullion III M.D.   On: 04/03/2021 18:53   CT Chest W Contrast  Result Date: 04/03/2021 CLINICAL DATA:  [place], anticoagulated, cough EXAM: CT CHEST WITH CONTRAST TECHNIQUE: Multidetector CT imaging of the chest was performed during intravenous contrast administration. CONTRAST:  22mL OMNIPAQUE IOHEXOL 300 MG/ML  SOLN COMPARISON:  04/03/2021 FINDINGS: Cardiovascular: The heart  is enlarged, with prominent biatrial dilatation. No pericardial effusion. No evidence of thoracic aortic aneurysm or dissection. Mild atherosclerosis. Mediastinum/Nodes: Borderline enlarged mediastinal and hilar lymph nodes are identified, measuring up to 11 mm in the precarinal region. These are likely reactive. Thyroid, trachea, and esophagus are unremarkable. Lungs/Pleura: There is diffuse interlobular septal thickening, with scattered bilateral areas of nodular airspace disease. This could reflect early edema or atypical infection such as viral pneumonia. No effusion or pneumothorax. The central airways are patent. Upper Abdomen: No acute abnormality. Multiple hepatic hypodensities compatible with cysts. Musculoskeletal: No acute or destructive bony lesions. Reconstructed images demonstrate no additional findings. IMPRESSION: 1. Diffuse interlobular septal thickening with patchy areas of airspace disease bilaterally. Viral pneumonia or early edema could give this appearance. 2. Marked cardiomegaly. 3.  Aortic Atherosclerosis (ICD10-I70.0). Electronically Signed   By: Randa Ngo M.D.   On: 04/03/2021 22:39   CT CERVICAL SPINE  WO CONTRAST  Result Date: 04/03/2021 CLINICAL DATA:  Trauma.  Pain.  Patient on blood thinners. EXAM: CT HEAD WITHOUT CONTRAST CT CERVICAL SPINE WITHOUT CONTRAST TECHNIQUE: Multidetector CT imaging of the head and cervical spine was performed following the standard protocol without intravenous contrast. Multiplanar CT image reconstructions of the cervical spine were also generated. COMPARISON:  None. FINDINGS: CT HEAD FINDINGS Brain: No evidence of acute infarction, hemorrhage, hydrocephalus, extra-axial collection or mass lesion/mass effect. Vascular: Calcified atherosclerosis in the intracranial carotids. Skull: Normal. Negative for fracture or focal lesion. Sinuses/Orbits: No acute finding. Other: None. CT CERVICAL SPINE FINDINGS Alignment: Normal. Skull base and vertebrae: No  acute fracture. No primary bone lesion or focal pathologic process. Soft tissues and spinal canal: No prevertebral fluid or swelling. No visible canal hematoma. Disc levels:  Multilevel degenerative disc disease. Upper chest: No acute abnormalities. Other: No other abnormalities. IMPRESSION: 1. No acute intracranial abnormalities. 2. No cervical spine fracture or traumatic malalignment. Electronically Signed   By: Gerome Sam III M.D.   On: 04/03/2021 18:53   DG Pelvis Portable  Result Date: 04/03/2021 CLINICAL DATA:  Fall.  Patient on blood thinners. EXAM: PORTABLE PELVIS 1-2 VIEWS COMPARISON:  None. FINDINGS: There mild symmetric degenerative changes of the hips. There is no evidence of fracture or dislocation. Degenerative change of the spine. IMPRESSION: No acute findings. Electronically Signed   By: Elberta Fortis M.D.   On: 04/03/2021 18:25   DG Chest Port 1 View  Result Date: 04/03/2021 CLINICAL DATA:  Fall.  Patient on blood thinners. EXAM: PORTABLE CHEST 1 VIEW COMPARISON:  None. FINDINGS: Lungs are adequately inflated and demonstrate bilateral diffuse hazy interstitial prominence which may be acute or chronic. No lobar consolidation or effusion. No pneumothorax. Borderline cardiomegaly. No evidence of fracture. IMPRESSION: 1. No acute fracture. 2. Diffuse hazy interstitial prominence which may be acute or chronic. Acute infection is possible. 3. Borderline cardiomegaly. Electronically Signed   By: Elberta Fortis M.D.   On: 04/03/2021 18:25   DG Hand Complete Left  Result Date: 04/03/2021 CLINICAL DATA:  Fall.  Patient on blood thinners. EXAM: LEFT HAND - COMPLETE 3+ VIEW COMPARISON:  None. FINDINGS: There mild degenerate changes over the radiocarpal joint, carpal bones and first metacarpal joints. There mild degenerate changes over the MCP joints and interphalangeal joints. Bone alignment and mineralization is normal. There are no bony erosions. No acute fracture or dislocation.  IMPRESSION: 1. No acute findings. 2. Degenerative changes as described. Electronically Signed   By: Elberta Fortis M.D.   On: 04/03/2021 18:27   ECHOCARDIOGRAM COMPLETE  Result Date: 04/06/2021    ECHOCARDIOGRAM REPORT   Patient Name:   KARSON SLATER Date of Exam: 04/06/2021 Medical Rec #:  [id number]       Height:       62.0 in Accession #:    [id number]      Weight:       120.4 lb Date of Birth:  April 15, 1935      BSA:          1.541 m Patient Age:    86 years        BP:           118/64 mmHg Patient Gender: F               HR:           83 bpm. Exam Location:  Inpatient Procedure: 2D Echo Indications:    congestive heart failure  History:  Patient has no prior history of Echocardiogram examinations.                 COPD, Arrythmias:Atrial Fibrillation; Risk Factors:Dyslipidemia.  Sonographer:    Delcie Roch RDCS Referring Phys: [id number] VISHAL R PATEL IMPRESSIONS  1. Left ventricular ejection fraction, by estimation, is 55 to 60%. The left ventricle has normal function. The left ventricle has no regional wall motion abnormalities. There is mild left ventricular hypertrophy. Left ventricular diastolic parameters are indeterminate.  2. Right ventricular systolic function is mildly reduced. The right ventricular size is normal. There is normal pulmonary artery systolic pressure. The estimated right ventricular systolic pressure is 29.6 mmHg.  3. Left atrial size was mildly dilated.  4. Right atrial size was mildly dilated.  5. 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 Mild to moderate mitral valve regurgitation. No evidence of mitral stenosis.  6. Tricuspid valve regurgitation is mild to moderate.  7. The aortic valve is grossly normal. Aortic valve regurgitation is not visualized. No aortic stenosis is present.  8. The inferior vena cava is normal in size with greater than 50% respiratory variability, suggesting right atrial pressure of 3 mmHg. FINDINGS  Left Ventricle: Left  ventricular ejection fraction, by estimation, is 55 to 60%. The left ventricle has normal function. The left ventricle has no regional wall motion abnormalities. The left ventricular internal cavity size was normal in size. There is  mild left ventricular hypertrophy. Left ventricular diastolic parameters are indeterminate. Right Ventricle: The right ventricular size is normal. Right vetricular wall thickness was not well visualized. Right ventricular systolic function is mildly reduced. There is normal pulmonary artery systolic pressure. The tricuspid regurgitant velocity is 2.58 m/s, and with an assumed right atrial pressure of 3 mmHg, the estimated right ventricular systolic pressure is 29.6 mmHg. Left Atrium: Left atrial size was mildly dilated. Right Atrium: Right atrial size was mildly dilated. Pericardium: Trivial pericardial effusion is present. 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 Mild to moderate mitral valve regurgitation. No evidence of mitral valve stenosis. Tricuspid Valve: The tricuspid valve is grossly normal. Tricuspid valve regurgitation is mild to moderate. No evidence of tricuspid stenosis. Aortic Valve: The aortic valve is grossly normal. Aortic valve regurgitation is not visualized. No aortic stenosis is present. Pulmonic Valve: The pulmonic valve was grossly normal. Pulmonic valve regurgitation is mild. No evidence of pulmonic stenosis. Aorta: The aortic root is normal in size and structure. Venous: The inferior vena cava is normal in size with greater than 50% respiratory variability, suggesting right atrial pressure of 3 mmHg. IAS/Shunts: No atrial level shunt detected by color flow Doppler.  LEFT VENTRICLE PLAX 2D LVIDd:         3.70 cm LVIDs:         2.60 cm LV PW:         1.00 cm LV IVS:        1.10 cm LVOT diam:     1.70 cm LVOT Area:     2.27 cm  IVC IVC diam: 1.00 cm LEFT ATRIUM             Index        RIGHT ATRIUM           Index LA diam:         4.10 cm 2.66 cm/m   RA Area:     14.70 cm LA Vol (A2C):   48.6 ml 31.55 ml/m  RA Volume:  33.60 ml  21.81 ml/m LA Vol (A4C):   50.3 ml 32.65 ml/m LA Biplane Vol: 49.7 ml 32.26 ml/m   AORTA Ao Root diam: 2.90 cm Ao Asc diam:  3.30 cm TRICUSPID VALVE TR Peak grad:   26.6 mmHg TR Vmax:        258.00 cm/s  SHUNTS Systemic Diam: 1.70 cm Cherlynn Kaiser MD Electronically signed by Cherlynn Kaiser MD Signature Date/Time: 04/06/2021/12:32:50 PM    Final     Microbiology: Recent Results (from the past 240 hour(s))  Resp Panel by RT-PCR (Flu A&B, Covid) Nasopharyngeal Swab     Status: None   Collection Time: 04/03/21  6:06 PM   Specimen: Nasopharyngeal Swab; Nasopharyngeal(NP) swabs in vial transport medium  Result Value Ref Range Status   SARS Coronavirus 2 by RT PCR NEGATIVE NEGATIVE Final    Comment: (NOTE) SARS-CoV-2 target nucleic acids are NOT DETECTED.  The SARS-CoV-2 RNA is generally detectable in upper respiratory specimens during the acute phase of infection. The lowest concentration of SARS-CoV-2 viral copies this assay can detect is 138 copies/mL. A negative result does not preclude SARS-Cov-2 infection and should not be used as the sole basis for treatment or other patient management decisions. A negative result may occur with  improper specimen collection/handling, submission of specimen other than nasopharyngeal swab, presence of viral mutation(s) within the areas targeted by this assay, and inadequate number of viral copies(<138 copies/mL). A negative result must be combined with clinical observations, patient history, and epidemiological information. The expected result is Negative.  Fact Sheet for Patients:  EntrepreneurPulse.com.au  Fact Sheet for Healthcare Providers:  IncredibleEmployment.be  This test is no t yet approved or cleared by the [place] FDA and  has been authorized for detection and/or diagnosis of SARS-CoV-2  by FDA under an Emergency Use Authorization (EUA). This EUA will remain  in effect (meaning this test can be used) for the duration of the COVID-19 declaration under Section 564(b)(1) of the Act, 21 U.S.C.section 360bbb-3(b)(1), unless the authorization is terminated  or revoked sooner.       Influenza A by PCR NEGATIVE NEGATIVE Final   Influenza B by PCR NEGATIVE NEGATIVE Final    Comment: (NOTE) The Xpert Xpress SARS-CoV-2/FLU/RSV plus assay is intended as an aid in the diagnosis of influenza from Nasopharyngeal swab specimens and should not be used as a sole basis for treatment. Nasal washings and aspirates are unacceptable for Xpert Xpress SARS-CoV-2/FLU/RSV testing.  Fact Sheet for Patients: EntrepreneurPulse.com.au  Fact Sheet for Healthcare Providers: IncredibleEmployment.be  This test is not yet approved or cleared by the [place] FDA and has been authorized for detection and/or diagnosis of SARS-CoV-2 by FDA under an Emergency Use Authorization (EUA). This EUA will remain in effect (meaning this test can be used) for the duration of the COVID-19 declaration under Section 564(b)(1) of the Act, 21 U.S.C. section 360bbb-3(b)(1), unless the authorization is terminated or revoked.  Performed at [facility], [place] [address] [place], [place] [address]   Urine Culture     Status: Abnormal   Collection Time: 04/03/21  9:19 PM   Specimen: Urine, Clean Catch  Result Value Ref Range Status   Specimen Description URINE, CLEAN CATCH  Final   Special Requests NONE  Final   Culture (A)  Final    >=100,000 COLONIES/mL KLEBSIELLA PNEUMONIAE Two isolates with different morphologies were identified as the same organism.The most resistant organism was reported. Performed at [facility], [place] [address],  [place], [place] [address]    Report Status 04/07/2021 FINAL  Final   Organism ID, Bacteria KLEBSIELLA PNEUMONIAE (A)  Final       Susceptibility   Klebsiella pneumoniae - MIC*    AMPICILLIN >=32 RESISTANT Resistant     CEFAZOLIN <=4 SENSITIVE Sensitive     CEFEPIME <=0.12 SENSITIVE Sensitive     CEFTRIAXONE <=0.25 SENSITIVE Sensitive     CIPROFLOXACIN <=0.25 SENSITIVE Sensitive     GENTAMICIN <=1 SENSITIVE Sensitive     IMIPENEM <=0.25 SENSITIVE Sensitive     NITROFURANTOIN 128 RESISTANT Resistant     TRIMETH/SULFA <=20 SENSITIVE Sensitive     AMPICILLIN/SULBACTAM 8 SENSITIVE Sensitive     PIP/TAZO <=4 SENSITIVE Sensitive     * >=100,000 COLONIES/mL KLEBSIELLA PNEUMONIAE  MRSA Next Gen by PCR, Nasal     Status: None   Collection Time: 04/04/21  3:31 AM   Specimen: Nasal Mucosa; Nasal Swab  Result Value Ref Range Status   MRSA by PCR Next Gen NOT DETECTED NOT DETECTED Final    Comment: (NOTE) The GeneXpert MRSA Assay (FDA approved for NASAL specimens only), is one component of a comprehensive MRSA colonization surveillance program. It is not intended to diagnose MRSA infection nor to guide or monitor treatment for MRSA infections. Test performance is not FDA approved in patients less than 73 years old. Performed at [facility], [place] [address], [place], [place] [address]   Resp Panel by RT-PCR (Flu A&B, Covid) Nasopharyngeal Swab     Status: None   Collection Time: 04/07/21  3:02 PM   Specimen: Nasopharyngeal Swab; Nasopharyngeal(NP) swabs in vial transport medium  Result Value Ref Range Status   SARS Coronavirus 2 by RT PCR NEGATIVE NEGATIVE Final    Comment: (NOTE) SARS-CoV-2 target nucleic acids are NOT DETECTED.  The SARS-CoV-2 RNA is generally detectable in upper respiratory specimens during the acute phase of infection. The lowest concentration of SARS-CoV-2 viral copies this assay can detect is 138 copies/mL. A negative result does not preclude SARS-Cov-2 infection and should not be used as the sole basis for treatment or other patient management decisions. A negative result may occur with   improper specimen collection/handling, submission of specimen other than nasopharyngeal swab, presence of viral mutation(s) within the areas targeted by this assay, and inadequate number of viral copies(<138 copies/mL). A negative result must be combined with clinical observations, patient history, and epidemiological information. The expected result is Negative.  Fact Sheet for Patients:  EntrepreneurPulse.com.au  Fact Sheet for Healthcare Providers:  IncredibleEmployment.be  This test is no t yet approved or cleared by the [place] FDA and  has been authorized for detection and/or diagnosis of SARS-CoV-2 by FDA under an Emergency Use Authorization (EUA). This EUA will remain  in effect (meaning this test can be used) for the duration of the COVID-19 declaration under Section 564(b)(1) of the Act, 21 U.S.C.section 360bbb-3(b)(1), unless the authorization is terminated  or revoked sooner.       Influenza A by PCR NEGATIVE NEGATIVE Final   Influenza B by PCR NEGATIVE NEGATIVE Final    Comment: (NOTE) The Xpert Xpress SARS-CoV-2/FLU/RSV plus assay is intended as an aid in the diagnosis of influenza from Nasopharyngeal swab specimens and should not be used as a sole basis for treatment. Nasal washings and aspirates are unacceptable for Xpert Xpress SARS-CoV-2/FLU/RSV testing.  Fact Sheet for Patients: EntrepreneurPulse.com.au  Fact Sheet for Healthcare Providers: IncredibleEmployment.be  This test is not yet approved or cleared by the  [place] Architectural technologist and has been authorized for detection and/or diagnosis of SARS-CoV-2 by FDA under an Print production planner (EUA). This EUA will remain in effect (meaning this test can be used) for the duration of the COVID-19 declaration under Section 564(b)(1) of the Act, 21 U.S.C. section 360bbb-3(b)(1), unless the authorization is terminated  or revoked.  Performed at [facility], [place] [address], [place], [place] [address]      Labs: Basic Metabolic Panel: Recent Labs  Lab 04/03/21 1806 04/03/21 1821 04/04/21 0125 04/04/21 1129 04/05/21 0047 04/06/21 0041  NA 138 140 137  --  135 136  K 3.0* 3.3* 2.8* 3.3* 3.4* 3.6  CL 100 101 101  --  97* 98  CO2 28  --  26  --  28 28  GLUCOSE 100* 99 125*  --  119* 120*  BUN 5* 6* <5*  --  6* 11  CREATININE 0.77 0.70 0.72  --  0.76 0.92  CALCIUM 8.9  --  8.4*  --  8.9 8.7*  MG  --   --  1.8  --   --  1.9   Liver Function Tests: Recent Labs  Lab 04/03/21 1806  AST 16  ALT 9  ALKPHOS 59  BILITOT 0.9  PROT 7.4  ALBUMIN 3.5   No results for input(s): LIPASE, AMYLASE in the last 168 hours. No results for input(s): AMMONIA in the last 168 hours. CBC: Recent Labs  Lab 04/03/21 1806 04/03/21 1821 04/04/21 0125 04/05/21 0047 04/06/21 0041  WBC 10.3  --  11.1* 10.9* 10.5  NEUTROABS  --   --   --  7.1 6.7  HGB 13.2 14.3 12.4 13.0 12.9  HCT 41.9 42.0 38.6 39.7 39.9  MCV 95.4  --  95.1 93.6 95.0  PLT 291  --  271 277 283   Cardiac Enzymes: No results for input(s): CKTOTAL, CKMB, CKMBINDEX, TROPONINI in the last 168 hours. BNP: BNP (last 3 results) Recent Labs    04/04/21 0125 04/07/21 0026  BNP 279.4* 81.0    ProBNP (last 3 results) No results for input(s): PROBNP in the last 8760 hours.  CBG: Recent Labs  Lab 04/03/21 1804  GLUCAP 101*       Signed:  Kayleen Memos, MD [facility] 04/08/2021, 10:10 AM

## 2021-04-08 NOTE — Progress Notes (Signed)
Heart Failure Nurse Navigator Progress Note  Pending DC today to ALF at [facility], awaiting facility to approve.   Per cardiology note: pt plans to follow with Dr. Flora Lipps (was [facility]). Scheduled HV TOC appt 12/6 @ 10AM for close hospital follow up. [facility] appt scheduled for early January 2023.   Ozella Rocks, MSN, RN Heart Failure Nurse Navigator [phone]

## 2021-04-15 ENCOUNTER — Ambulatory Visit (HOSPITAL_COMMUNITY)
Admit: 2021-04-15 | Discharge: 2021-04-15 | Disposition: A | Discharge status: Home / Self Care | Payer: Medicare Other | Attending: Family Medicine | Admitting: Family Medicine

## 2021-04-15 ENCOUNTER — Telehealth (HOSPITAL_COMMUNITY): Payer: Self-pay

## 2021-04-15 DIAGNOSIS — I5032 Chronic diastolic (congestive) heart failure: Secondary | ICD-10-CM

## 2021-04-15 NOTE — Progress Notes (Signed)
Patient no showed

## 2021-04-15 NOTE — Telephone Encounter (Signed)
Call attempted to confirm HV TOC appt today at 10AM. HIPPA appropriate VM left with callback number.   Ozella Rocks, MSN, RN Heart Failure Nurse Navigator [phone]

## 2021-04-21 ENCOUNTER — Encounter (HOSPITAL_COMMUNITY): Payer: Self-pay

## 2021-05-06 NOTE — Progress Notes (Deleted)
Cardiology Office Note:    Date:  05/06/2021   ID:  Lindsay Rush, DOB [birth year], MRN [id number]  PCP:  Joycelyn Man, NP  Cardiologist:  None  Electrophysiologist:  None   Referring MD: Joycelyn Man*   Chief Complaint: hospital follow-up of CHF  History of Present Illness:    Lindsay Rush is a 86 y.o. female with a history of chronic diastolic CHF, chronic atrial fibrillation on Xarelto, chronic hypotension on Midodrine and Florinef, mitral valve prolapse with mild to moderate mitral regurgitation, mild to moderate tricuspid regurgitation, hyperlipidemia, hypothyroidism, and suspected dementia who presents today for hospital follow-up of CHF.  Patient was recently admitted from 04/03/2021 to 04/08/2021 for acute hypoxic respiratory failure secondary to community acquired pneumonia and acute on chronic diastolic CHF. Patient was moved from [facility] to [facility] in 02/2021 and daughter reported that patient had had atrial fibrillation for over 20 years and was on Digoxin and Xarelto. Patient also on Midodrine and Florinef for chronic hypotension. Echo during admission showed LVEF of 55-60% with normal wall motion, mildly reduced RV systolic function, mild biatrial enlargement, bileaflet mitral valve prolapse with mild to moderate MR, mild to moderate TR. She was treated with antibiotics and IV lasix. Florinef was stopped and Midodrine was increased. EKG showed evidence of Digitalis effect so Digoxin was decreased.  Patient had a visit with the  [facility] on 04/15/2021 but no showed this.  Patient presents today for follow-up. ***  Chronic Diastolic CHF Patient recently admitted for acute hypoxic respiratory failure secondary to pneumonia and acute on chronic diastolic CHF. Echo showed LVEF of 55-60% with normal wall motion, mildly reduced RV systolic function, mild biatrial enlargement, bileaflet mitral valve  prolapse with mild to moderate MR, mild to moderate TR. - Euvolemic on exam.  - Continue Lasix 40mg  daily. - Discussed importance of daily weights and sodium/fluid restrictions.  Chronic Atrial Fibrillation Rate controlled. - Continue Digoxin 0.125mg  daily.  - Not on any AV nodal agents due to chronic hypotension requiring Midodrine. - Continue Xarelto 15mg  daily. Reduced dose due to CrCl <51.  Chronic Hypotension Previously on Florinef and Midodrine but Florinef stopped during recent admission given CHF.  - BP stable. - Continue Midodrine 5mg  twice daily.  Mitral Valve Prolapse with Mitral Regurgitation Tricuspid Regurgitation Echo during recent admission showed bileaflet mitral valve prolapse with mild to moderate MR, mild to moderate TR. - Consider repeat Echo in 1-2 years for routine monitoring of this.  Hyperlipidemia *** - Continue Zetia 10mg  daily.  Past Medical History:  Diagnosis Date   Atrial fibrillation (HCC)    Chronic diastolic CHF (congestive heart failure) (HCC)    Chronic hypotension    COPD (chronic obstructive pulmonary disease) (HCC)    Dementia (HCC)    Fall    Hyperlipidemia    Hypertension    Hypothyroidism    Thyrotoxicosis     No past surgical history on file.  Current Medications: No outpatient medications have been marked as taking for the 05/14/21 encounter (Appointment) with , PA-C.     Allergies:   Codeine   Social History   Socioeconomic History   Marital status: Widowed    Spouse name: Not on file   Number of children: Not on file   Years of education: Not on file   Highest education level: Not on file  Occupational History   Not on file  Tobacco Use   Smoking status: Never  Smokeless tobacco: Never  Vaping Use   Vaping Use: Every day  Substance and Sexual Activity   Alcohol use: Never   Drug use: Never    Comment: unable to answer   Sexual activity: Not Currently    Comment: unable to answer  Other  Topics Concern   Not on file  Social History Narrative   Not on file   Social Determinants of Health   Financial Resource Strain: Not on file  Food Insecurity: Not on file  Transportation Needs: Not on file  Physical Activity: Not on file  Stress: Not on file  Social Connections: Not on file     Family History: The patient's family history includes Heart attack in her father.  ROS:   Please see the history of present illness.     EKGs/Labs/Other Studies Reviewed:    The following studies were reviewed today:  Echocardiogram 04/06/2021: Impressions:  1. Left ventricular ejection fraction, by estimation, is 55 to 60%. The  left ventricle has normal function. The left ventricle has no regional  wall motion abnormalities. There is mild left ventricular hypertrophy.  Left ventricular diastolic parameters  are indeterminate.   2. Right ventricular systolic function is mildly reduced. The right  ventricular size is normal. There is normal pulmonary artery systolic  pressure. The estimated right ventricular systolic pressure is 0000000 mmHg.   3. Left atrial size was mildly dilated.   4. Right atrial size was mildly dilated.   5. 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 Mild  to moderate mitral valve regurgitation. No evidence of mitral stenosis.   6. Tricuspid valve regurgitation is mild to moderate.   7. The aortic valve is grossly normal. Aortic valve regurgitation is not  visualized. No aortic stenosis is present.   8. The inferior vena cava is normal in size with greater than 50%  respiratory variability, suggesting right atrial pressure of 3 mmHg.   EKG:  EKG not ordered today.   Recent Labs: 04/03/2021: ALT 9 04/06/2021: BUN 11; Creatinine, Ser 0.92; Hemoglobin 12.9; Magnesium 1.9; Platelets 283; Potassium 3.6; Sodium 136 04/07/2021: B Natriuretic Peptide 81.0; TSH 1.264  Recent Lipid Panel No results found for: CHOL, TRIG, HDL, CHOLHDL, VLDL,  LDLCALC, LDLDIRECT  Physical Exam:    Vital Signs: There were no vitals taken for this visit.    Wt Readings from Last 3 Encounters:  04/08/21 123 lb 7.3 oz (56 kg)  03/02/21 130 lb (59 kg)     General: 86 y.o. female in no acute distress. HEENT: Normocephalic and atraumatic. Sclera clear. EOMs intact. Neck: Supple. No carotid bruits. No JVD. Heart: *** RRR. Distinct S1 and S2. No murmurs, gallops, or rubs. Radial and distal pedal pulses 2+ and equal bilaterally. Lungs: No increased work of breathing. Clear to ausculation bilaterally. No wheezes, rhonchi, or rales.  Abdomen: Soft, non-distended, and non-tender to palpation. Bowel sounds present in all 4 quadrants.  MSK: Normal strength and tone for age. *** Extremities: No lower extremity edema.    Skin: Warm and dry. Neuro: Alert and oriented x3. No focal deficits. Psych: Normal affect. Responds appropriately.   Assessment:    No diagnosis found.  Plan:     Disposition: Follow up in ***   Medication Adjustments/Labs and Tests Ordered: Current medicines are reviewed at length with the patient today.  Concerns regarding medicines are outlined above.  No orders of the defined types were placed in this encounter.  No orders of the defined types were  placed in this encounter.   There are no Patient Instructions on file for this visit.   Signed, Darreld Mclean, PA-C  05/06/2021 12:17 PM    [facility]

## 2021-05-14 ENCOUNTER — Encounter: Payer: Self-pay | Admitting: Student

## 2021-05-14 ENCOUNTER — Other Ambulatory Visit: Payer: Self-pay

## 2021-05-14 ENCOUNTER — Ambulatory Visit: Payer: Medicare Other | Admitting: Nurse Practitioner

## 2021-05-14 VITALS — BP 132/66 | HR 93 | Ht 62.0 in | Wt 120.8 lb

## 2021-05-14 DIAGNOSIS — I5032 Chronic diastolic (congestive) heart failure: Secondary | ICD-10-CM

## 2021-05-14 DIAGNOSIS — Z79899 Other long term (current) drug therapy: Secondary | ICD-10-CM | POA: Diagnosis not present

## 2021-05-14 DIAGNOSIS — I341 Nonrheumatic mitral (valve) prolapse: Secondary | ICD-10-CM | POA: Diagnosis not present

## 2021-05-14 DIAGNOSIS — I959 Hypotension, unspecified: Secondary | ICD-10-CM

## 2021-05-14 DIAGNOSIS — Z8709 Personal history of other diseases of the respiratory system: Secondary | ICD-10-CM

## 2021-05-14 DIAGNOSIS — I4821 Permanent atrial fibrillation: Secondary | ICD-10-CM

## 2021-05-14 NOTE — Patient Instructions (Addendum)
Medication Instructions:  INCREASE Lasix to 40 mg 2 times a day for 3 days on day 4 going forward take 40 mg daily INCREASE Potassium to 20 meq 2 times a day for 3 days on day 4 going forward take 20 meq daily  *If you need a refill on your cardiac medications before your next appointment, please call your pharmacy*  Lab Work: Your physician recommends that you return for lab work in 1 week:  BMET  If you have labs (blood work) drawn today and your tests are completely normal, you will receive your results only by: [employer] (if you have MyChart) OR A paper copy in the mail If you have any lab test that is abnormal or we need to change your treatment, we will call you to review the results.  Testing/Procedures: You have been referred to pulmonology  Follow-Up: At [facility], you and your health needs are our priority.  As part of our continuing mission to provide you with exceptional heart care, we have created designated Provider Care Teams.  These Care Teams include your primary Cardiologist (physician) and Advanced Practice Providers (APPs -  Physician Assistants and Nurse Practitioners) who all work together to provide you with the care you need, when you need it.   Your next appointment:   3 month(s)  The format for your next appointment:   In Person  Provider:   Reatha Harps, MD    Other Instructions  Weigh yourself EVERY morning after you go to the bathroom but before you eat or drink anything. Write this number down in a weight log/diary. If you gain 3 pounds overnight or 5 pounds in a week, call the office. Take your medicines as prescribed. If you have concerns about your medications, please call us before you stop taking them.  Eat low salt foods--Limit salt (sodium) to 2000 mg per day. This will help prevent your body from holding onto fluid. Read food labels as many processed foods have a lot of sodium, especially canned goods and prepackaged meats. If you  would like some assistance choosing low sodium foods, we would be happy to set you up with a nutritionist. Stay as active as you can everyday. Staying active will give you more energy and make your muscles stronger. Start with 5 minutes at a time and work your way up to 30 minutes a day. Break up your activities--do some in the morning and some in the afternoon. Start with 3 days per week and work your way up to 5 days as you can.  If you have chest pain, feel short of breath, dizzy, or lightheaded, STOP. If you don't feel better after a short rest, call 911. If you do feel better, call the office to let us know you have symptoms with exercise. Limit all fluids for the day to less than 2 liters. Fluid includes all drinks, coffee, juice, ice chips, soup, jello, and all other liquids.

## 2021-05-14 NOTE — Progress Notes (Signed)
Office Visit    Patient Name: Lindsay Rush Date of Encounter: 05/14/2021  Primary Care Provider:  Joycelyn Man, NP Primary Cardiologist:  Reatha Harps, MD  Chief Complaint   87 year old female with a history of dementia, permanent atrial fibrillation on Xarelto (renal dosing), chronic diastolic heart failure, hyperlipidemia, COPD, falls and hypothyroidism who presents for follow-up related to heart failure and atrial fibrillation.   Past Medical History    Past Medical History:  Diagnosis Date   Atrial fibrillation (HCC)    Chronic diastolic CHF (congestive heart failure) (HCC)    Chronic hypotension    COPD (chronic obstructive pulmonary disease) (HCC)    Dementia (HCC)    Fall    Hyperlipidemia    Hypertension    Hypothyroidism    Thyrotoxicosis    No past surgical history on file.  Allergies  Allergies  Allergen Reactions   Codeine     History of Present Illness    87 year old female with the above past medical history including dementia, permanent atrial fibrillation on Xarelto (renal dosing), chronic diastolic heart failure, hypotension, hyperlipidemia, COPD, falls, and hypothyroidism.  Patient is a resident of [facility]. She was initially evaluated by cardiology for hypoxia requiring O2 and possible diastolic heart failure during a recent hospitalization in the setting of a fall. She was hospitalized from 04/03/21-04/08/21. CXR showed diffuse haze interstitial prominence, borderline cardiomegaly. BNP was mildly elevated. CT of the chest showed marked cardiomegaly, aortic atherosclerosis, and a trivial pericardial effusion. Echo on 04/06/21 showed EF 55-60%, mild LVH, indeterminate diastolic parameters, mildly reduced RV systolic function, mild BAE, bileaflet MVP with myxomatous mitral valve, mild-moderate MR, and mild-moderate TR (medical management recommended).   She was diuresed with IV Lasix and transitioned to oral  Lasix prior to discharge, though her symptoms were thought to be multifactorial, with concern for underlying ILD. Additionally, her EKG showed STTW changes inII, II, avF, V-5-V6, concerning for digoxin effect. Her digoxin was reduced to 0.125 mg daily. Given history of hypotension and question of fluid retention/heart failure, her Florinef was changed to midodrine to avoid additional fluid retention. Her hospital course was complicated by acute metabolic encephalopathy in the setting of Klebsiella pneumonia UTI, which resolved with antibiotics. She was discharged to [facility] with HHPT/OT.   Presents today for follow-up accompanied by her daughter. Since her hospital discharge reports feeling well overall from a cardiac standpoint.  Does have mild increase in ankle edema over the past week with a mild cough. Her daughter thinks this is related to fluid build up.  She denies shortness of breath, orthopnea, PND.  Otherwise, she has no complaints or concerns today.  Home Medications    Current Outpatient Medications  Medication Sig Dispense Refill   acetaminophen (TYLENOL) 325 MG tablet Take 650 mg by mouth every 6 (six) hours as needed for moderate pain or headache.     digoxin (LANOXIN) 0.125 MG tablet Take 1 tablet (0.125 mg total) by mouth daily. 30 tablet 0   escitalopram (LEXAPRO) 5 MG tablet Take 5 mg by mouth daily.     ezetimibe (ZETIA) 10 MG tablet Take 10 mg by mouth at bedtime.     Fluticasone-Umeclidin-Vilant (TRELEGY ELLIPTA) 200-62.5-25 MCG/ACT AEPB Inhale 1 puff into the lungs daily.     furosemide (LASIX) 40 MG tablet Take 1 tablet (40 mg total) by mouth daily. 30 tablet 0   polyethylene glycol powder (GLYCOLAX/MIRALAX) 17 GM/SCOOP powder Take 17 g by mouth daily.  potassium chloride (KLOR-CON) 20 MEQ packet Take 20 mEq by mouth daily. 30 packet 0   Rivaroxaban (XARELTO) 15 MG TABS tablet Take 15 mg by mouth daily.     thyroid (ARMOUR) 30 MG tablet Take 30 mg by  mouth daily before breakfast.     traZODone (DESYREL) 50 MG tablet Take 50 mg by mouth at bedtime.     No current facility-administered medications for this visit.     Review of Systems    She denies chest pain, palpitations, dyspnea, pnd, orthopnea, n, v, dizziness, syncope, weight gain, or early satiety. All other systems reviewed and are otherwise negative except as noted above.   Physical Exam    VS:  BP 132/66    Pulse 93    Ht 5\' 2"  (1.575 m)    Wt 120 lb 12.8 oz (54.8 kg)    SpO2 95%    BMI 22.09 kg/m  , BMI Body mass index is 22.09 kg/m. GEN: Well nourished, well developed, in no acute distress. HEENT: normal. Neck: Supple, no JVD, carotid bruits, or masses. Cardiac: RRR, no murmurs, rubs, or gallops. No clubbing, cyanosis. Bilateral non-pitting ankle edema. Radials/DP/PT 2+ and equal bilaterally.  Respiratory:  Respirations regular and unlabored, coarse crackles at bases bilaterally. GI: Soft, nontender, nondistended, BS + x 4. MS: no deformity or atrophy. Skin: warm and dry, no rash. Neuro:  Strength and sensation are intact. Psych: Normal affect.  Accessory Clinical Findings    ECG personally reviewed by me today - No EKG in office today.   Lab Results  Component Value Date   WBC 10.5 04/06/2021   HGB 12.9 04/06/2021   HCT 39.9 04/06/2021   MCV 95.0 04/06/2021   PLT 283 04/06/2021   Lab Results  Component Value Date   CREATININE 0.92 04/06/2021   BUN 11 04/06/2021   NA 136 04/06/2021   K 3.6 04/06/2021   CL 98 04/06/2021   CO2 28 04/06/2021   Lab Results  Component Value Date   ALT 9 04/03/2021   AST 16 04/03/2021   ALKPHOS 59 04/03/2021   BILITOT 0.9 04/03/2021   No results found for: CHOL, HDL, LDLCALC, LDLDIRECT, TRIG, CHOLHDL  No results found for: HGBA1C  Assessment & Plan    1. Chronic diastolic heart failure: Echo on 04/06/21 showed EF 55-60%, mild LVH, indeterminate diastolic parameters, mildly reduced RV systolic function, mild BAE,  bileaflet MVP with myxomatous mitral valve, mild-moderate MR, and mild-moderate TR (medical management recommended). Recent hospitalization in the setting of fall, during which she required IV diuresis. She does have increased ankle edema, which she states has occurred over the past week.  Weight is stable.  Well compensated on exam today.  I will increase her Lasix to 40 mg twice daily x 3 days, then resume Lasix 40 mg daily. Given recent low potassium, will also have her take 20 mEq of potassium twice daily for 3 days.  Repeat BMP in 1 week.  Continue digoxin. Discussed daily weights, fluid and sodium restriction.  2. Bileaflet mitral valve prolapse: Recent echo as above.  Asymptomatic. Continue to monitor for symptoms.  3. Permanent atrial fibrillation: Rate controlled. Continue digoxin, Xarelto.   4. Hypotension: BP stable in office today 132/60.  Continue Lasix, digoxin.  5. Hyperlipidemia: No recent lipid panel available.  Consider repeat lipids at next visit.  For now, continue Zetia.   6. COPD: Required oxygen during recent hospitalization. CXR during recent hospitalization showed diffuse haze interstitial prominence. She does  have coarse crackles on auscultation more consistent with fibrosis rather than heart failure.  She did have COVID recently as well.  Will refer to pulmonology for further evaluation.  7. Disposition: Follow-up in 3 months.  Joylene GrapesEmily C Brodie Correll, NP 05/14/2021, 3:15 PM

## 2021-05-24 ENCOUNTER — Encounter (HOSPITAL_BASED_OUTPATIENT_CLINIC_OR_DEPARTMENT_OTHER): Payer: Self-pay

## 2021-05-24 ENCOUNTER — Emergency Department (HOSPITAL_BASED_OUTPATIENT_CLINIC_OR_DEPARTMENT_OTHER): Payer: Medicare Other

## 2021-05-24 ENCOUNTER — Emergency Department (HOSPITAL_BASED_OUTPATIENT_CLINIC_OR_DEPARTMENT_OTHER)
Admission: EM | Admit: 2021-05-24 | Discharge: 2021-05-25 | Disposition: A | Discharge status: Home / Self Care | Payer: Medicare Other | Attending: Emergency Medicine | Admitting: Emergency Medicine

## 2021-05-24 ENCOUNTER — Other Ambulatory Visit: Payer: Self-pay

## 2021-05-24 DIAGNOSIS — I4891 Unspecified atrial fibrillation: Secondary | ICD-10-CM | POA: Diagnosis not present

## 2021-05-24 DIAGNOSIS — Z79899 Other long term (current) drug therapy: Secondary | ICD-10-CM | POA: Diagnosis not present

## 2021-05-24 DIAGNOSIS — J449 Chronic obstructive pulmonary disease, unspecified: Secondary | ICD-10-CM | POA: Insufficient documentation

## 2021-05-24 DIAGNOSIS — M25562 Pain in left knee: Secondary | ICD-10-CM | POA: Diagnosis present

## 2021-05-24 DIAGNOSIS — S8392XA Sprain of unspecified site of left knee, initial encounter: Secondary | ICD-10-CM

## 2021-05-24 DIAGNOSIS — M25561 Pain in right knee: Secondary | ICD-10-CM | POA: Insufficient documentation

## 2021-05-24 DIAGNOSIS — W0110XA Fall on same level from slipping, tripping and stumbling with subsequent striking against unspecified object, initial encounter: Secondary | ICD-10-CM | POA: Insufficient documentation

## 2021-05-24 DIAGNOSIS — W19XXXA Unspecified fall, initial encounter: Secondary | ICD-10-CM

## 2021-05-24 DIAGNOSIS — S63633A Sprain of interphalangeal joint of left middle finger, initial encounter: Secondary | ICD-10-CM

## 2021-05-24 NOTE — ED Triage Notes (Signed)
Pt BIB [facility] coming from [facility]. Pt was getting into the bed, but slide out injuring her R middle finger and L knee. Pt A/Ox4 on arrival.   EMS Vitals: 152/92 HR 92 RR 18 CBG 143

## 2021-05-24 NOTE — ED Provider Notes (Signed)
Lindsay Rush   CSN: [id number] Arrival date & time: 05/24/21  2252     History  Chief Complaint  Patient presents with   Lindsay Rush    Lindsay Rush is a 87 y.o. female.  Patient is an 87 year old female with past medical history of COPD, chronic A. fib, hyperlipidemia, dementia.  Patient sent from [facility] assisted living for evaluation of fall.  Patient was apparently getting into bed when she slipped and fell on the floor.  She describes twisting her knee and injuring her left middle finger.  She has pain in both of these areas.  She denies having struck her head or experiencing other injury during the fall.  The history is provided by the patient.  Fall This is a new problem. The current episode started less than 1 hour ago. The problem occurs constantly. The problem has not changed since onset.Nothing relieves the symptoms. She has tried nothing for the symptoms.      Home Medications Prior to Admission medications   Medication Sig Start Date End Date Taking? Authorizing Provider  acetaminophen (TYLENOL) 325 MG tablet Take 650 mg by mouth every 6 (six) hours as needed for moderate pain or headache.    [provider]  digoxin (LANOXIN) 0.125 MG tablet Take 1 tablet (0.125 mg total) by mouth daily. 04/08/21 05/14/21  Kayleen Memos, DO  escitalopram (LEXAPRO) 5 MG tablet Take 5 mg by mouth daily.    [provider]  ezetimibe (ZETIA) 10 MG tablet Take 10 mg by mouth at bedtime.    [provider]  Fluticasone-Umeclidin-Vilant (TRELEGY ELLIPTA) 200-62.5-25 MCG/ACT AEPB Inhale 1 puff into the lungs daily.    [provider]  furosemide (LASIX) 40 MG tablet Take 1 tablet (40 mg total) by mouth daily. 04/07/21 05/14/21  Kayleen Memos, DO  polyethylene glycol powder (GLYCOLAX/MIRALAX) 17 GM/SCOOP powder Take 17 g by mouth daily.    [provider]  potassium chloride (KLOR-CON) 20 MEQ packet Take 20 mEq  by mouth daily. 04/08/21 05/14/21  Kayleen Memos, DO  Rivaroxaban (XARELTO) 15 MG TABS tablet Take 15 mg by mouth daily.    [provider]  thyroid (ARMOUR) 30 MG tablet Take 30 mg by mouth daily before breakfast.    [provider]  traZODone (DESYREL) 50 MG tablet Take 50 mg by mouth at bedtime.    [provider]      Allergies    Codeine    Review of Systems   Review of Systems  All other systems reviewed and are negative.  Physical Exam Updated Vital Signs BP (!) 196/100    Pulse 97    Temp (!) 97.5 F (36.4 C) (Oral)    Resp 20    Ht 5\' 2"  (1.575 m)    Wt 54.4 kg    SpO2 92%    BMI 21.95 kg/m  Physical Exam Vitals and nursing Rush reviewed.  Constitutional:      General: She is not in acute distress.    Appearance: Normal appearance. She is not ill-appearing.  HENT:     Head: Normocephalic and atraumatic.  Pulmonary:     Effort: Pulmonary effort is normal.  Musculoskeletal:     Comments: The left knee has mild swelling noted, but no palpable effusion.  There is no obvious deformity.  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  Exam limited secondary to discomfort, but appears stable.  Range of motion limited secondary to discomfort.  The left middle finger has some swelling around the PIP joint, but no obvious deformity.  She has pain with movement or range of motion.  Distal sensation is intact and capillary refill is brisk.  Skin:    General: Skin is warm and dry.  Neurological:     Mental Status: She is alert.    ED Results / Procedures / Treatments   Labs (all labs ordered are listed, but only abnormal results are displayed) Labs Reviewed - No data to display  EKG None  Radiology No results found.  Procedures Procedures    Medications Ordered in ED Medications - No data to display  ED Course/ Medical Decision Making/ A&P  X-rays of the finger and knee both unremarkable for fracture.  Patient seems to be moving both better than  upon presentation.  She will be placed in an Ace wrap for her knee and finger splint for her finger.  She is to ice, rest, and follow-up with primary doctor if not improving in the next week.  Final Clinical Impression(s) / ED Diagnoses Final diagnoses:  None    Rx / DC Orders ED Discharge Orders     None         Veryl Speak, MD 05/25/21 [phone]

## 2021-05-25 ENCOUNTER — Emergency Department (HOSPITAL_BASED_OUTPATIENT_CLINIC_OR_DEPARTMENT_OTHER): Payer: Medicare Other

## 2021-05-25 NOTE — Discharge Instructions (Signed)
Rest.  Weightbearing as tolerated.  Ice for 20 minutes every 2 hours while awake for the next 2 days.  Follow-up with primary doctor if not improving in the next week.

## 2021-05-25 NOTE — ED Notes (Signed)
Snack/drink given. Call bell within reach.

## 2021-05-25 NOTE — ED Notes (Signed)
[facility] Called -- Two others ahead of PT

## 2021-06-08 NOTE — Progress Notes (Signed)
Synopsis: Referred for COPD by Lenna Sciara, NP  Subjective:   PATIENT ID: Lindsay Rush GENDER: female DOB: April 02, 1935, MRN: [id number]  Chief Complaint  Patient presents with   Pulmonary Consult     Referred by Diona Browner, NP.  Pt had Covid 19 in Dec 2022. She has had some SOB off and on since then.    72yF with history of AF, dCHF, COPD, hypothyroid, HTN and now hypotension, dementia, vaping referred for COPD. Did have covid-19 in 04/2021, they do not think she took paxlovid though she did have nausea and cough.   Hospitalized and discharged 04/08/21 for fall, CAP and encephalopathy, acute diastolic heart failure.   She says her breathing is fine. Does not report DOE when she does walk around assisted. She does have some dry cough. She has no chest pain. She does wonder if she has had raynaud's phenomenon.   She is unsure whether trelegy is making difference for her breathing.  Otherwise pertinent review of systems is negative.  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 Has done some gardening in past. Has lived in [place], [place], [place]. Never smoker.   Past Medical History:  Diagnosis Date   Atrial fibrillation (HCC)    Chronic diastolic CHF (congestive heart failure) (HCC)    Chronic hypotension    COPD (chronic obstructive pulmonary disease) (HCC)    Dementia (HCC)    Fall    Hyperlipidemia    Hypertension    Hypothyroidism    Thyrotoxicosis      Family History  Problem Relation Age of Onset   Heart attack Father      Past Surgical History:  Procedure Laterality Date   ABDOMINAL HYSTERECTOMY     APPENDECTOMY     CHOLECYSTECTOMY      Social History   Socioeconomic History   Marital status: Widowed    Spouse name: Not on file   Number of children: Not on file   Years of education: Not on file   Highest education level: Not on file  Occupational History   Not on file  Tobacco Use    Smoking status: Never   Smokeless tobacco: Never  Vaping Use   Vaping Use: Every day  Substance and Sexual Activity   Alcohol use: Never   Drug use: Never    Comment: unable to answer   Sexual activity: Not Currently    Comment: unable to answer  Other Topics Concern   Not on file  Social History Narrative   Not on file   Social Determinants of Health   Financial Resource Strain: Not on file  Food Insecurity: Not on file  Transportation Needs: Not on file  Physical Activity: Not on file  Stress: Not on file  Social Connections: Not on file  Intimate Partner Violence: Not on file     Allergies  Allergen Reactions   Codeine      Outpatient Medications Prior to Visit  Medication Sig Dispense Refill   acetaminophen (TYLENOL) 325 MG tablet Take 650 mg by mouth every 6 (six) hours as needed for moderate pain or headache.     digoxin (LANOXIN) 0.125 MG tablet Take 1 tablet (0.125 mg total) by mouth daily. 30 tablet 0   escitalopram (LEXAPRO) 5 MG tablet Take 5 mg by mouth daily.     ezetimibe (ZETIA) 10 MG tablet Take 10 mg by mouth at bedtime.  Fluticasone-Umeclidin-Vilant (TRELEGY ELLIPTA) 200-62.5-25 MCG/ACT AEPB Inhale 1 puff into the lungs daily.     furosemide (LASIX) 40 MG tablet Take 1 tablet (40 mg total) by mouth daily. 30 tablet 0   polyethylene glycol powder (GLYCOLAX/MIRALAX) 17 GM/SCOOP powder Take 17 g by mouth daily.     potassium chloride (KLOR-CON) 20 MEQ packet Take 20 mEq by mouth daily. 30 packet 0   Rivaroxaban (XARELTO) 15 MG TABS tablet Take 15 mg by mouth daily.     thyroid (ARMOUR) 30 MG tablet Take 30 mg by mouth daily before breakfast.     traZODone (DESYREL) 50 MG tablet Take 50 mg by mouth at bedtime.     No facility-administered medications prior to visit.       Objective:   Physical Exam:  General appearance: 87 y.o., female, NAD, conversant  Eyes: anicteric sclerae; PERRL, tracking appropriately HENT: NCAT; MMM Neck: Trachea  midline; no lymphadenopathy, no JVD Lungs: CTAB, no crackles, no wheeze, with normal respiratory effort CV: RRR, no murmur  Abdomen: Soft, non-tender; non-distended, BS present  Extremities: No peripheral edema, warm Skin: Normal turgor and texture; no rash Psych: Appropriate affect Neuro: Alert and oriented to person and place, no focal deficit     Vitals:   06/09/21 1146  BP: 118/76  Pulse: 75  Temp: 97.7 F (36.5 C)  TempSrc: Oral  SpO2: 97%  Weight: 119 lb (54 kg)  Height: 5\' 2"  (1.575 m)   97% on RA BMI Readings from Last 3 Encounters:  06/09/21 21.77 kg/m  05/24/21 21.95 kg/m  05/14/21 22.09 kg/m   Wt Readings from Last 3 Encounters:  06/09/21 119 lb (54 kg)  05/24/21 120 lb (54.4 kg)  05/14/21 120 lb 12.8 oz (54.8 kg)     CBC    Component Value Date/Time   WBC 10.5 04/06/2021 0041   RBC 4.20 04/06/2021 0041   HGB 12.9 04/06/2021 0041   HCT 39.9 04/06/2021 0041   PLT 283 04/06/2021 0041   MCV 95.0 04/06/2021 0041   MCH 30.7 04/06/2021 0041   MCHC 32.3 04/06/2021 0041   RDW 15.0 04/06/2021 0041   LYMPHSABS 2.5 04/06/2021 0041   MONOABS 1.0 04/06/2021 0041   EOSABS 0.2 04/06/2021 0041   BASOSABS 0.1 04/06/2021 0041    Chest Imaging: CT Chest 04/03/21 reviewed by me remarkable for huge LA, interlobular septal thickening, traction bronchiectasis, mosaicism, adenopathy likely exaggerated by volume overload  Pulmonary Functions Testing Results: No flowsheet data found.    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 CTD-ILD possible but few extrapulmonary symptoms suggestive. If mosaicism due to ILD then could think of HP and in past she did  kind of have bird exposure but no recent exposures. Recurrent aspiration possible but upper lobe predominance seems to make this a bit less likely. Regardless apart from some cough which doesn't seem to bother her much it's not clear that it's affecting her day to day life yet.  # Thrush  Plan: - RF, CCP, ANA, CK, HP panel - full PFTs next available as baseline - in-office simple spirometry next visit in 6 months - continue trelegy 1 puff once daily, needs to start rinsing mouth after each use - start nystatin QID for  2 weeks - HRCT Chest in 6 months      Maryjane Hurter, MD [facility] 06/09/2021 11:53 AM

## 2021-06-09 ENCOUNTER — Other Ambulatory Visit: Payer: Self-pay

## 2021-06-09 ENCOUNTER — Ambulatory Visit (INDEPENDENT_AMBULATORY_CARE_PROVIDER_SITE_OTHER): Payer: Medicare Other | Admitting: Student

## 2021-06-09 ENCOUNTER — Encounter: Payer: Self-pay | Admitting: Student

## 2021-06-09 VITALS — BP 118/76 | HR 75 | Temp 97.7°F | Ht 62.0 in | Wt 119.0 lb

## 2021-06-09 DIAGNOSIS — J849 Interstitial pulmonary disease, unspecified: Secondary | ICD-10-CM | POA: Diagnosis not present

## 2021-06-09 DIAGNOSIS — B370 Candidal stomatitis: Secondary | ICD-10-CM | POA: Diagnosis not present

## 2021-06-09 LAB — CK: Total CK: 16 U/L (ref 7–177)

## 2021-06-09 MED ORDER — NYSTATIN 100000 UNIT/ML MT SUSP: 5.0000 mL | Freq: Four times a day (QID) | OROMUCOSAL | 3 refills | Status: DC

## 2021-06-09 MED ORDER — NYSTATIN 100000 UNIT/ML MT SUSP: 5.0000 mL | Freq: Four times a day (QID) | OROMUCOSAL | 3 refills | Status: AC

## 2021-06-09 NOTE — Patient Instructions (Signed)
-   Labs today - You will be called to schedule breathing tests - CT scan in June, you will be called to schedule  - Need to rinse mouth after using trelegy 1 puff daily - we'll also check your breathing tests again in June/July at next appointment in 6 months!

## 2021-06-10 ENCOUNTER — Encounter: Payer: Self-pay | Admitting: *Deleted

## 2021-06-10 ENCOUNTER — Institutional Professional Consult (permissible substitution): Payer: Medicare Other | Admitting: Student

## 2021-06-10 LAB — ANA: Anti Nuclear Antibody (ANA): NEGATIVE

## 2021-06-10 LAB — RHEUMATOID FACTOR: Rheumatoid fact SerPl-aCnc: <14 IU/mL (ref <14)

## 2021-06-10 LAB — CYCLIC CITRUL PEPTIDE ANTIBODY, IGG: Cyclic Citrullin Peptide Ab: <16 UNITS

## 2021-06-11 ENCOUNTER — Encounter: Payer: Self-pay | Admitting: Psychiatry

## 2021-06-11 ENCOUNTER — Ambulatory Visit: Payer: Medicare Other | Admitting: Psychiatry

## 2021-06-11 VITALS — BP 139/72 | HR 77 | Ht 62.0 in | Wt 121.4 lb

## 2021-06-11 DIAGNOSIS — F03A4 Unspecified dementia, mild, with anxiety: Secondary | ICD-10-CM | POA: Diagnosis not present

## 2021-06-11 DIAGNOSIS — R413 Other amnesia: Secondary | ICD-10-CM | POA: Diagnosis not present

## 2021-06-11 NOTE — Patient Instructions (Addendum)
Consider donepezil (Aricept) for memory Consider increasing Lexapro for anxiety (can discuss with physician at the facility)   DEMENTIA OVERVIEW "Dementia" is a general term for when a person has developed difficulties with reasoning, judgment, and memory. People who have dementia usually have some memory loss as well as difficulty in at least one other area, such as: ?Speaking or writing coherently (or understanding what is said or written) ?Recognizing familiar surroundings ?Planning and carrying out complex or multi-step tasks In order to be considered dementia these issues must be severe enough to interfere with a person's independence and daily activities. Dementia can be caused by several diseases that affect the brain. The most common cause is Alzheimer disease. Alzheimer disease is present in approximately 60 to 80 percent of all cases of dementia; other degenerative and/or vascular diseases may be present as well, particularly as a person gets older.   DEMENTIA RISK FACTORS There is no way to predict with certainty who will develop dementia. Each form of dementia has its own risk factors, but most forms have several risk factors in common. Age -- The biggest risk factor for dementia is age: dementia is rare in people younger than 60 years and becomes very common in people older than 71. For example, dementia affects approximately one in six people between 34 and 67 years old, one in three above 85 years, and almost half of people over age 35.  Family history -- Some forms of dementia have a genetic component, meaning that they tend to run in families. Having a close family member with Alzheimer disease increases your chances of developing it. People with a first-degree relative (parent or sibling) with Alzheimer disease have a greater chance of developing the disorder. The risk is probably highest if the family member developed Alzheimer disease at a younger age (less than 75 years old) and  is lower if the family member did not get Alzheimer disease until late in life. However, families that have a very strong genetic tendency toward Alzheimer disease are uncommon.  Other factors -- Studies indicate that high blood pressure, smoking, and diabetes may be risk factors for dementia. Experts are still not sure how treatment for these problems might influence your risk of developing dementia beyond their benefit of reducing stroke risk. Lifestyle factors have also been implicated in dementia. For instance, people who remain physically active, socially connected, and mentally engaged seem less likely to develop dementia than people who do not. These activities may produce more cognitive (mental) reserve or resilience, delaying the emergence of symptoms until an older age.  DEMENTIA SYMPTOMS Each form of dementia can cause difficulty with memory, language, reasoning, and judgment, but the symptoms are often very different from person to person. Symptoms also change over time. The differences between one form of dementia and another may only be recognizable to skilled health care providers who have experience working with people with dementia. Sometimes family members notice changes but mistakenly attribute them to aging.  Is memory loss normal? -- Many people worry that memory problems are related to early Alzheimer disease. However, some problems are normal and just related to aging, and do not signify a progressive dementia. Normal age-related changes often cause minor difficulties with immediate memory, for example, remembering a phone number or a set of directions for a short time. Temporary difficulty recalling proper names, even very familiar ones, is also common with aging. As people age normally, it is common to complain of less efficient and slower processing and  learning of new information. Memory changes due to normal aging are usually mild and do not worsen greatly over time, nor should  they interfere with a person's day-to-day functioning.  Early changes -- The earliest symptoms of Alzheimer disease are gradual and often subtle. Many people and their families first notice difficulty recalling recent events or information. This often emerges as a tendency to repeat stories or questions or to request or require repetition of material to be able to remember. If you find yourself telling an older family member or friend "I told you that earlier" or "You have told me that more than once," you might begin to suspect Alzheimer disease. Other changes can include one or more of the following: ?Difficulties with language (eg, not being able to find the right words for things) ?Difficulty with concentration and reasoning ?Problems with complex tasks like paying bills, cooking, or balancing a checkbook ?Getting lost in a familiar place  Late changes -- As Alzheimer disease progresses, a person's ability to think clearly continues to decline, and any or all of the changes listed above may be more disruptive. In addition, personality and behavioral symptoms can become quite troublesome. These can include: ?Increased anger or hostility, sometimes aggressive behavior; alternatively, some people become depressed or exhibit little interest in their surroundings (called "apathy") ?Sleep problems ?Hallucinations and/or delusions ?Disorientation ?Needing help with basic tasks (such as eating, bathing, and dressing) ?Incontinence (difficulty controlling the bladder and/or bowels)  The number of symptoms, the functions that are impaired, and the speed with which symptoms progress can vary widely from one person to the next. In some people, severe dementia occurs within five years of the diagnosis; for others, the progression can take more than 10 years. Most people with Alzheimer disease do not die from the disease itself, but rather from a secondary illness such as pneumonia, bladder infection, or  complications of a fall.  DEMENTIA DIAGNOSIS To diagnose dementia and identify the type of dementia, health care providers typically rely on the information they can gather by interacting with the person and speaking with their family members. The provider will typically perform memory and other cognitive (thinking) tests to assess the person's degree of difficulty with different types of problems. The results of these tests can then be monitored over time to observe whether functioning stays the same or declines.  Blood tests are usually done to find out if a chemical or hormonal imbalance or vitamin deficiency is contributing to the person's difficulties. Brain scans (usually MRI) are often performed in people with dementia to look for other problems. Sometimes the MRI can also help health care providers identify the type of dementia, since different types can have characteristic brain changes.  SAFETY AND LIFESTYLE ISSUES FOR PEOPLE WITH DEMENTIA A major issue for caregivers is making sure the person with dementia stays safe. Because many people with dementia do not realize that their mental functioning is impaired, they try to continue their day-to-day activities as usual. This can lead to physical danger, and caregivers must help to avoid situations that can threaten the safety of the person or others. The following information applies specifically to people with Alzheimer disease, but much of it is also relevant to people with other forms of dementia.  Medications -- People with Alzheimer disease often have trouble remembering to take medications they are prescribed for other conditions, or they become confused about which medications to take. They are also at increased risk for potentially dangerous side effects from certain  medications. Sedatives and certain other drugs (such as some antihistamines and antidepressants) may carry risk of increasing cognitive impairment. It is important to develop a  plan for medication monitoring and safety. People with dementia often need help taking their medications. It's a good idea to throw away old pill bottles and other medications that are no longer needed.  Driving -- Driving is often one of the first safety issues that arises in people with Alzheimer disease. In people with Alzheimer disease, the risk of having a car accident is significantly increased, especially as the disease progresses. It is best to discuss the issue of driving early, before the symptoms become advanced. Over time, everyone living long enough with dementia will reach a point where driving is too dangerous. Losing the ability to drive can be hard to accept because it represents independence for many people. It can also be challenging if the person does not completely appreciate their impairments in mental functioning or reaction time. In particular, driving at night may carry extra risk. Many people with mild but worrisome impairments will insist that they can safely drive locally or in the daytime. That may be true for the present time; however, people may forget that they have agreed to limitations. In addition, their ability to drive safely, even with restrictions, will deteriorate over time. A roadside driving test is often recommended if there is disagreement or uncertainty about a person's ability to drive. However, if a person with newly diagnosed, mild Alzheimer disease is deemed still able to drive, they will need to be reassessed every six months, with the understanding that driving will eventually no longer be possible. There may be important insurance implications of continued driving when medical records document advice to stop driving.  Cooking -- Cooking is another area that can lead to serious safety concerns and may require help or supervision. Symptoms such as distractibility, forgetfulness, and difficulty following directions can lead to burns, fires, or other injuries.  The use of gas cooking appliance raises a particular concern. A family member may have to ask the utility company to disconnect gas stoves if there is potential for accident or injury. Newer induction electric stoves do not change color when on, and may carry an inadvertent burn risk if a person forgets what they are doing.  Wandering -- As dementia progresses, some people with Alzheimer disease begin to wander. Because restlessness, distractibility, and memory problems are common, a person who wanders may easily become lost. Identification bracelets can help ensure that a lost wanderer gets home. The [employer] provides a "Wandering Support" program that provides ID tags and 24-hour assistance to patients who are preregistered for this program. There are many "locator" applications that allow the person with dementia to wear or carry a GPS device that a family member can track with their cell phone. Regular exercise may decrease the restlessness that can lead to wandering. Exercise is also just good practice to maintain strength, good sleep, and overall health. If wandering continues, wearable alarm systems are available that alert caretakers when the person leaves the home.  Falls -- Falls with secondary injuries are one of the most important causes of additional disability in people with all types of dementia, including Alzheimer disease. Commonly used medications can increase risk of falls and injuries. Hip fracture is a particular concern in older people, as it can lead to serious complications and sometimes even death. To reduce the risk of falls, potential tripping hazards such as loose electrical cords, slippery  rugs, and clutter should be removed. Inadvertent hoarding may develop and pose a safety risk. If the person lives alone, family members or elder services should perform safety inspections of the living space periodically. Medication lists should also be reviewed with your doctor to  identify those that might increase the risk of falling. Regular exercise, especially early in the course of dementia, and use of assistive devices like canes can also help with balance.  DEMENTIA TREATMENT Medications for Alzheimer disease -- Many people with Alzheimer disease will have the option of trying a medication. A trial of medication is usually begun for a period of a few to several weeks while the person is monitored for side effects and response. A health care provider should periodically review all medications to see if they are providing any benefit. It is important to have realistic expectations about the potential benefits of medication therapy in Alzheimer disease. None of these medications cure the disease, and the reality is that over time the person will continue to worsen. When medication does have an effect, the goal is not to stop progression of the disease, but to improve quality of life for the person and their family to the extent possible.  Treatment to slow or delay progression -- There is currently no cure for Alzheimer disease. However, experts are studying treatments in the hope of finding a way to slow the progression of the mental and functional decline, along with scientific efforts to prevent or delay onset.  Treatment of memory problems -- There are several medicines currently available for treating the memory problems associated with Alzheimer disease; they are also used in people with other forms of dementia.  ?Donepezil (brand name: Aricept) This medications allow more of a chemical called acetylcholine to be active in the brain, making up for drops in acetylcholine levels that happen in Alzheimer disease. It can cause side effects such as nausea, vomiting, and diarrhea in some people. It also may cause weight loss in many people. When taken at bedtime, cholinesterase inhibitors can cause very vivid dreams. If there is no improvement in symptoms or side effects are  bothersome, the medication should be stopped. Sometimes the person's symptoms will worsen after treatment is stopped; if this happens, the medication may be started again.  Treatment of behavioral symptoms -- The behavioral symptoms of Alzheimer disease are often more troubling than the cognitive (mental) symptoms. Even in mild cases, agitation, anxiety, and irritability can occur, and generally worsen as Alzheimer disease advances. This can be stressful for the person as well as for their family and caregivers. A combination of medications and behavioral therapy may be helpful. Non-medication therapies are preferred, as virtually all medications used for behavioral symptoms can increase confusion and many are associated with serious side effects and even an increased risk of death.  Depression -- Depression is common, especially in the early phases of dementia. It may be treated with behavioral therapy and/or with medications. The key is to recognize that depression may be playing a role in the person's symptoms. If depression is causing distress, it is worth treating. Potentially helpful medicines include a group of medicines known as selective serotonin reuptake inhibitors, or SSRIs, which are usually preferred over other choices in patients with dementia. Widely used SSRIs include fluoxetine (brand name: Prozac), sertraline (brand name: Zoloft), paroxetine (sample brand names: Brisdelle, Paxil), citalopram (brand name: Celexa), and escitalopram (brand names: Lexapro, Cipralex).   A variety of behavioral therapies are often helpful, do not have the  side effects often seen with medications, and may be recommended for depression. Behavioral therapy involves changing the person's environment (eg, regular exercise, avoiding triggers that cause sadness, socializing with others, engaging in pleasant activities that a person enjoys).  Agitation and aggression -- One of the most difficult issues for caregivers  and people with Alzheimer disease is aggressive behavior. Fortunately, this behavior is not common. However, many family members are reluctant to report aggressive behavior. In some cases, the behavior becomes physically abusive as dementia progresses. Agitation and aggression can be caused by a number of factors, including: ?Confusion, misunderstanding, or disorientation (doctors use the term "delirium" as a general term to describe a state of confusion in which a person does not think or behave normally) ?Frightening or paranoid delusions or hallucinations ?Depression or anxiety ?Sleep disorders, such as reduced sleep or altered sleep/wake cycles ?Certain medical conditions that can cause delirium, such as urinary tract infection or pneumonia ?Being in physical pain or discomfort ?Side effects of certain medications Delusions (ie, believing something that is not real or true) are common in patients with dementia, occurring in up to 30 percent of those with advanced disease. Paranoid delusions are particularly distressing to both the patients and the caregivers: these often include beliefs that someone has invaded the house, that family members have been replaced by impostors, that spouses have been unfaithful, or that personal possessions have been stolen.   Family members should discuss any concerns about aggressive behavior with a health care provider and arrange for help if necessary. The best treatment for these symptoms depends upon what triggers them. As an example, a person who becomes aggressive during periods of confusion might best be treated by talking through the problem, while someone who becomes aggressive during delusions might require medication. Often, behavior improves once an underlying medical condition is treated. Caregivers can learn strategies to help lessen the number of triggers and confrontations.   Sleep problems -- Sleep disorders can be treated with either medicine or  behavior changes or both: for example, limiting daytime naps, increasing physical activity, avoiding caffeine and alcohol in the evening. In some people, medication to help with sleep may be recommended, although these medications almost always have side effects (eg, worsened confusion and increased risk of falls). Maintaining daily rhythms, using artificial lighting when needed during the day, and avoiding bright light exposures during the night may help maintain normal wake-sleep cycles.   COPING WITH DEMENTIA Being diagnosed with any form of dementia can be distressing and overwhelming for the person affected as well as their loved ones. For people with dementia -- It is important for people with early dementia to care for their physical and mental health. This means getting regular checkups, taking medicines if needed, eating a healthy diet, exercising regularly, getting enough sleep, and avoiding activities that may be risky. It is often helpful to talk to others through support groups or a counselor or social worker to discuss any feelings of anxiety, frustration, anger, loneliness, or depression. All of these feelings are normal, and dealing with these feelings can help you to feel more in control of your life and health. It can also help to talk to other people who are going through a similar experience. Another issue to consider is how to tell your family and friends about your diagnosis. Explaining the disease can help others to understand what to expect and how they can help, now and in the future. This can be especially helpful for children and grandchildren,  who may not be familiar with the condition. While many people are able to live alone in the early stages of dementia, you may need help with tasks such as housekeeping, cooking, transportation, and paying bills. If possible, ask a friend or family member for help making plans to deal with these and other issues as dementia progresses.  Occupational therapists, and sometimes speech pathologists, can help to set up your home to minimize confusion and keep you independent for as long as possible. It's also important to establish Power of Attorney and Health Care Proxy statuses early, before a financial or health care crisis happens. This involves completing paperwork to determine who can make decisions on your behalf if needed. In addition, you should discuss your preferences regarding issues that are likely to become important as your dementia worsens, including: ?Is health insurance available, and what does it cover? ?Where will I live? ?Who will make health care and end-of-life decisions if I can't make them for myself? ?Who will pay for care? A number of resources are available to assist in this type of planning.   For caregivers -- Dementia can also impose an enormous burden on families and other caregivers. People with dementia become less able to care for themselves as the condition progresses. If you are caring for someone with dementia, the following may help: ?Make a daily plan and prepare to be flexible if needed. ?Try to be patient when responding to repetitive questions, behaviors, or statements. ?Try not to argue or confront the person with dementia when they express mistaken ideas or facts. Change the subject or gently remind the person of an inaccuracy. Arguing or trying to convince a person of "the truth" is a natural reaction but it can be frustrating to all and can trigger unwanted behavior and feelings. ?Use memory aids such as writing out a list of daily activities, phone numbers, and instructions for usual tasks (ie, the telephone, microwave, etc). It may help if these are posted and easily visible so that the person need not remember to look for the aids. ?Establish calm and consistent nighttime routines to manage behavioral problems, which are often worst at night. Leave a night light on in the person's  bedroom. ?Avoid major changes to the home environment (for example, rearranging furniture). ?Employ safety measures in the home, such as putting locks on medicine cabinets, keeping furniture in the same place to prevent falls, reducing clutter, removing electrical appliances from the bathroom, installing grab bars in the bathroom, and setting the water heater below 120F. ?Help the person with personal care tasks as needed. It is not necessary to bathe every day, although a health care provider should be notified if the person develops sores in the mouth or genitals related to hygiene problems (eg, ill-fitting dentures, urine leakage). ?Speak slowly, present only one idea at a time, and be patient when waiting for responses. ?Encourage physical activity and exercise. Even a daily walk can help prevent physical decline and improve behavioral problems. ?Consider respite care. Respite care can provide a needed break and give you a chance to recharge. This is offered in many communities in the form of in-home care or adult day care. Caregiving can be an all-consuming experience, and it's essential to take time for yourself, take care of your own medical problems, and arrange for breaks when you need them. ?See if your area has a support group for people caring for loved ones with dementia. It can help to talk with other people who  understand what you are going through.

## 2021-06-11 NOTE — Progress Notes (Signed)
[facility]  PATIENT: Lindsay Rush DOB: May 07, 1935  REFERRING CLINICIAN: Roetta Sessions* HISTORY FROM: self, son, daughter in law REASON FOR VISIT: memory loss   HISTORICAL  CHIEF COMPLAINT:  Chief Complaint  Patient presents with   Mild cognitive impairment    Rm 1 New Pt son- Delfino Lovett, dgtr-in-law -Izora Gala resides at [facility] MMSE 21    HISTORY OF PRESENT ILLNESS:  The patient presents for evaluation of memory loss which has been present over the past 2 years. Patient does not feel she has significant memory issues, but family has noticed changes with her short term memory.   She will sometimes forget conversations she had earlier in the day and will have to write things down to help her remember. Will call her daughter with a questions, then will call again later that day to repeat those questions. She will mix up her facts, for example once she said she has eight children, but it is actually her mother who had 8 children. She also had some disorientation and sundowning when in the hospital recently for a fall and later when she was in rehab. The patient does not remember this.  She was moved to an assisted living facility in October 2022. Had a knee replacement and husband filed for divorce around the same time. She could not move back in with him so she moved to the assisted living facility.  She has had 3 falls. Most recent fall was 05/24/21 when she slipped and fell getting into bed. She uses a walker and wheelchair at home.  [facility] 04/05/22 showed chronic encephalomalacia of the left frontal lobe, mild generalized atrophy, and mild chronic small vessel ischemic changes  TBI: [place] and hit her head in a grocery store several years ago, had another fall in the bathroom after taking Ambien Stroke:  no past history of stroke Seizures:  no past history of seizures Sleep: No history of sleep apnea. Takes Trazodone for sleep Mood: patient  denies anxiety and depression. However family noticed she has episode where she feels depressed. Takes Lexapro 5 mg daily and is not sure if this is making a difference  Functional status:  Patient lives at [facility] (assisted living plus), moved in October 2022 [place] helps with cooking Cleaning: Facility helps with cleaning Shopping: Facility helps with shopping Bathing: Full assist in the shower. Sometimes forgets if she has showered. For example she recently stated she hadn't had a shower in 3 weeks, but actually had a shower the day before. Driving: hasn't driven for 6 years Bills: Son handles finances Medications: Facility helps with medications Forgetting loved ones names?: no Word finding difficulty? : no  OTHER MEDICAL CONDITIONS: COPD, afib, HLD, hypothyroidism, depression/anxiety   REVIEW OF SYSTEMS: Full 14 system review of systems performed and negative with exception of: memory loss, anxiety  ALLERGIES: Allergies  Allergen Reactions   Codeine     HOME MEDICATIONS: Outpatient Medications Prior to Visit  Medication Sig Dispense Refill   acetaminophen (TYLENOL) 325 MG tablet Take 650 mg by mouth every 6 (six) hours as needed for moderate pain or headache.     digoxin (LANOXIN) 0.125 MG tablet Take 1 tablet (0.125 mg total) by mouth daily. 30 tablet 0   escitalopram (LEXAPRO) 5 MG tablet Take 5 mg by mouth daily.     ezetimibe (ZETIA) 10 MG tablet Take 10 mg by mouth at bedtime.     Fluticasone-Umeclidin-Vilant (TRELEGY ELLIPTA) 200-62.5-25 MCG/ACT AEPB Inhale 1  puff into the lungs daily.     furosemide (LASIX) 40 MG tablet Take 1 tablet (40 mg total) by mouth daily. 30 tablet 0   nystatin (MYCOSTATIN) 100000 UNIT/ML suspension Take 5 mLs (500,000 Units total) by mouth 4 (four) times daily for 14 days. 60 mL 3   polyethylene glycol powder (GLYCOLAX/MIRALAX) 17 GM/SCOOP powder Take 17 g by mouth daily.     potassium chloride (KLOR-CON) 20 MEQ  packet Take 20 mEq by mouth daily. 30 packet 0   Rivaroxaban (XARELTO) 15 MG TABS tablet Take 15 mg by mouth daily.     thyroid (ARMOUR) 30 MG tablet Take 30 mg by mouth daily before breakfast.     traZODone (DESYREL) 50 MG tablet Take 50 mg by mouth at bedtime.     No facility-administered medications prior to visit.    PAST MEDICAL HISTORY: Past Medical History:  Diagnosis Date   Atrial fibrillation (HCC)    Chronic diastolic CHF (congestive heart failure) (HCC)    Chronic hypotension    COPD (chronic obstructive pulmonary disease) (HCC)    Dementia (HCC)    Fall    Hyperlipidemia    Hypertension    Hypothyroidism    Insomnia    MCI (mild cognitive impairment)    Thyrotoxicosis     PAST SURGICAL HISTORY: Past Surgical History:  Procedure Laterality Date   ABDOMINAL HYSTERECTOMY     APPENDECTOMY     CHOLECYSTECTOMY     left knee replacement  12/2020    FAMILY HISTORY: Family History  Problem Relation Age of Onset   Heart attack Father     SOCIAL HISTORY: Social History   Socioeconomic History   Marital status: Married    Spouse name: Not on file   Number of children: 2   Years of education: Not on file   Highest education level: High school graduate  Occupational History    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  Tobacco Use   Smoking status: Never   Smokeless tobacco: Never  Vaping Use   Vaping Use: Every day  Substance and Sexual Activity   Alcohol use: Never   Drug use: Never    Comment: unable to answer   Sexual activity: Not Currently  Other Topics Concern   Not on file  Social History Narrative   06/11/21 resides at [facility] Determinants of Health   Financial Resource Strain: Not on file  Food Insecurity: Not on file  Transportation Needs: Not on file  Physical Activity: Not on file  Stress: Not on file  Social Connections: Not on file  Intimate Partner Violence: Not on file     PHYSICAL EXAM  GENERAL  EXAM/CONSTITUTIONAL: Vitals:  Vitals:   06/11/21 1339  BP: 139/72  Pulse: 77  Weight: 121 lb 6.4 oz (55.1 kg)  Height: 5\' 2"  (1.575 m)   Body mass index is 22.2 kg/m. Wt Readings from Last 3 Encounters:  06/11/21 121 lb 6.4 oz (55.1 kg)  06/09/21 119 lb (54 kg)  05/24/21 120 lb (54.4 kg)   Patient is in no distress; well developed, nourished and groomed; neck is supple  CARDIOVASCULAR: Examination of peripheral vascular system by observation and palpation is normal  EYES: Pupils round and reactive to light, Visual fields full to confrontation, Extraocular movements intact   MUSCULOSKELETAL: Gait, strength, tone, movements noted in Neurologic exam below  NEUROLOGIC: MENTAL STATUS:  MMSE - Mini Mental State Exam 06/11/2021  Orientation to time 4  Orientation to  Place 2  Registration 3  Attention/ Calculation 1  Recall 3  Language- name 2 objects 2  Language- repeat 1  Language- follow 3 step command 3  Language- read & follow direction 1  Write a sentence 1  Copy design 0  Total score 21   Some confabulation during exam  CRANIAL NERVE:  2nd, 3rd, 4th, 6th - pupils equal and reactive to light, visual fields full to confrontation, extraocular muscles intact, no nystagmus 5th - facial sensation symmetric 7th - facial strength symmetric 8th - hearing intact 9th - palate elevates symmetrically, uvula midline 11th - shoulder shrug symmetric 12th - tongue protrusion midline  MOTOR:  normal bulk and tone, no cogwheeling, full strength in the BUE, BLE  SENSORY:  normal and symmetric to light touch all 4 extremities  COORDINATION:  finger-nose-finger, fine finger movements normal, no tremor  REFLEXES:  deep tendon reflexes present and symmetric    DIAGNOSTIC DATA (LABS, IMAGING, TESTING) - I reviewed patient records, labs, notes, testing and imaging myself where available.  Lab Results  Component Value Date   WBC 10.5 04/06/2021   HGB 12.9 04/06/2021   HCT  39.9 04/06/2021   MCV 95.0 04/06/2021   PLT 283 04/06/2021      Component Value Date/Time   NA 136 04/06/2021 0041   K 3.6 04/06/2021 0041   CL 98 04/06/2021 0041   CO2 28 04/06/2021 0041   GLUCOSE 120 (H) 04/06/2021 0041   BUN 11 04/06/2021 0041   CREATININE 0.92 04/06/2021 0041   CALCIUM 8.7 (L) 04/06/2021 0041   PROT 7.4 04/03/2021 1806   ALBUMIN 3.5 04/03/2021 1806   AST 16 04/03/2021 1806   ALT 9 04/03/2021 1806   ALKPHOS 59 04/03/2021 1806   BILITOT 0.9 04/03/2021 1806   GFRNONAA >60 04/06/2021 0041   No results found for: CHOL, HDL, LDLCALC, LDLDIRECT, TRIG, CHOLHDL No results found for: HGBA1C No results found for: VITAMINB12 Lab Results  Component Value Date   TSH 1.264 04/07/2021    ASSESSMENT AND PLAN  87 y.o. year old female with a history of COPD, afib, HLD, hypothyroidism, depression/anxiety who presents for evaluation of memory loss over the past 2 years. MMSE today is 21, which is suggestive of mild dementia. Her memory issues do appear to impact her ADLs and she is receiving significant assistance through her living facility. Family is here today because they would like to establish a cognitive baseline. Offered neuropsychiatric evaluation, which they declined at this time. She would prefer not to start a medication for memory at this time.  Family would like help with managing her anxiety. Advised that they discuss possibly increasing her Lexapro dose with prescribing physician at her living facility.   1. Mild dementia with anxiety, unspecified dementia type   2. Memory change       PLAN: - Labs: B12 - Consider increasing Lexapro to 10 mg daily  Orders Placed This Encounter  Procedures   Vitamin B12    No orders of the defined types were placed in this encounter.   Return in about 1 year (around 06/11/2022).  I spent an average of 61 minutes chart reviewing and counseling the patient, with at least 50% of the time face to face with the  patient.  Genia Harold, MD 06/11/21 4:39 PM  [facility] [address], [place], [place] [address] [phone]'

## 2021-06-12 LAB — VITAMIN B12: Vitamin B-12: 243 pg/mL (ref 232–1245)

## 2021-06-13 LAB — HYPERSENSITIVITY PNEUMONITIS
A. Pullulans Abs: NEGATIVE
A.Fumigatus #1 Abs: NEGATIVE
Micropolyspora faeni, IgG: NEGATIVE
Pigeon Serum Abs: NEGATIVE
Thermoact. Saccharii: NEGATIVE
Thermoactinomyces vulgaris, IgG: NEGATIVE

## 2021-08-03 NOTE — Progress Notes (Signed)
?Cardiology Office Note:   ?Date:  08/04/2021  ?NAME:  Lindsay BergeronBarbara Rush    ?MRN: [id number] ?DOB:  27-Sep-1934  ? ?PCP:  Lindsay ManFreeman, Courtney James, NP  ?Cardiologist:  Reatha HarpsWesley T O'Neal, MD  ?Electrophysiologist:  None  ? ?Referring MD: Lindsay Rush*  ? ?Chief Complaint  ?Patient presents with  ? Follow-up  ?   ?  ? ?History of Present Illness:   ?Lindsay BergeronBarbara Rush is a 87 y.o. female with a hx of permanent Afib, dementia, hypotension, ILD? Who presents for follow-up. Admit 04/03/2021 for Fall. Cardiology consulted for HFPEF. She had a fall and UTI.  Remains in A-fib.  Seems well controlled.  She is done well with physical therapy.  Now living in [facility] assisted living.  Denies any chest pain or trouble breathing.  Presents with her daughter.  Blood pressure stable 130/62.  Remains on digoxin as well as Xarelto.  Does describe some easy bruising however no major bleeding reported.  Has had difficulties with other rate control agents.  Volume status well controlled today.  On Lasix daily.  No major complaints in office.  Has been diagnosed with interstitial lung disease however surveillance is the option that has been chosen. ? ?Problem List ?Permanent Afib ?Dementia  ?HFpEF ?COPD ?Chronic hypotension ?Bileaflet MVP ?ILD? ? ?Past Medical History: ?Past Medical History:  ?Diagnosis Date  ? Atrial fibrillation (HCC)   ? Chronic diastolic CHF (congestive heart failure) (HCC)   ? Chronic hypotension   ? COPD (chronic obstructive pulmonary disease) (HCC)   ? Dementia (HCC)   ? Fall   ? Hyperlipidemia   ? Hypertension   ? Hypothyroidism   ? Insomnia   ? MCI (mild cognitive impairment)   ? Thyrotoxicosis   ? ? ?Past Surgical History: ?Past Surgical History:  ?Procedure Laterality Date  ? ABDOMINAL HYSTERECTOMY    ? APPENDECTOMY    ? CHOLECYSTECTOMY    ? left knee replacement  12/2020  ? ? ?Current Medications: ?Current Meds  ?Medication Sig  ? ACETAMINOPHEN EXTRA STRENGTH 500 MG tablet Take 500 mg by mouth 3 (three)  times daily.  ? digoxin (LANOXIN) 0.125 MG tablet Take 1 tablet (0.125 mg total) by mouth daily.  ? escitalopram (LEXAPRO) 5 MG tablet Take 5 mg by mouth daily.  ? ezetimibe (ZETIA) 10 MG tablet Take 10 mg by mouth at bedtime.  ? Fluticasone-Umeclidin-Vilant (TRELEGY ELLIPTA) 200-62.5-25 MCG/ACT AEPB Inhale 1 puff into the lungs daily.  ? furosemide (LASIX) 40 MG tablet Take 1 tablet (40 mg total) by mouth daily.  ? polyethylene glycol powder (GLYCOLAX/MIRALAX) 17 GM/SCOOP powder Take 17 g by mouth daily.  ? potassium chloride (KLOR-CON) 20 MEQ packet Take 20 mEq by mouth daily.  ? Rivaroxaban (XARELTO) 15 MG TABS tablet Take 15 mg by mouth daily.  ? thyroid (ARMOUR) 30 MG tablet Take 30 mg by mouth daily before breakfast.  ? traZODone (DESYREL) 50 MG tablet Take 50 mg by mouth at bedtime.  ?  ? ?Allergies:    ?Codeine  ? ?Social History: ?Social History  ? ?Socioeconomic History  ? Marital status: Married  ?  Spouse name: Not on file  ? Number of children: 2  ? Years of education: Not on file  ? Highest education level: High school graduate  ?Occupational History  ?  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 Smoking status: Never  ? Smokeless tobacco: Never  ?Vaping Use  ? Vaping Use: Every day  ?Substance and Sexual Activity  ?  Alcohol use: Never  ? Drug use: Never  ?  Comment: unable to answer  ? Sexual activity: Not Currently  ?Other Topics Concern  ? Not on file  ?Social History Narrative  ? 06/11/21 resides at [facility]  ? ?Social Determinants of Health  ? ?Financial Resource Strain: Not on file  ?Food Insecurity: Not on file  ?Transportation Needs: Not on file  ?Physical Activity: Not on file  ?Stress: Not on file  ?Social Connections: Not on file  ?  ? ?Family History: ?The patient's family history includes Heart attack in her father. ? ?ROS:   ?All other ROS reviewed and negative. Pertinent positives noted in the HPI.    ? ?EKGs/Labs/Other Studies Reviewed:   ?The following studies were  personally reviewed by me today: ? ?TTE 04/06/2021 ? 1. Left ventricular ejection fraction, by estimation, is 55 to 60%. The  ?left ventricle has normal function. The left ventricle has no regional  ?wall motion abnormalities. There is mild left ventricular hypertrophy.  ?Left ventricular diastolic parameters  ?are indeterminate.  ? 2. Right ventricular systolic function is mildly reduced. The right  ?ventricular size is normal. There is normal pulmonary artery systolic  ?pressure. The estimated right ventricular systolic pressure is 29.6 mmHg.  ? 3. Left atrial size was mildly dilated.  ? 4. Right atrial size was mildly dilated.  ? 5. 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 Mild  ?to moderate mitral valve regurgitation. No evidence of mitral stenosis.  ? 6. Tricuspid valve regurgitation is mild to moderate.  ? 7. The aortic valve is grossly normal. Aortic valve regurgitation is not  ?visualized. No aortic stenosis is present.  ? 8. The inferior vena cava is normal in size with greater than 50%  ?respiratory variability, suggesting right atrial pressure of 3 mmHg.  ? ?Recent Labs: ?04/03/2021: ALT 9 ?04/06/2021: BUN 11; Creatinine, Ser 0.92; Hemoglobin 12.9; Magnesium 1.9; Platelets 283; Potassium 3.6; Sodium 136 ?04/07/2021: B Natriuretic Peptide 81.0; TSH 1.264  ? ?Recent Lipid Panel ?No results found for: CHOL, TRIG, HDL, CHOLHDL, VLDL, LDLCALC, LDLDIRECT ? ?Physical Exam:   ?VS:  BP 130/62   Pulse 76   Ht 5\' 2"  (1.575 m)   Wt 119 lb 9.6 oz (54.3 kg)   SpO2 96%   BMI 21.88 kg/m?    ?Wt Readings from Last 3 Encounters:  ?08/04/21 119 lb 9.6 oz (54.3 kg)  ?06/11/21 121 lb 6.4 oz (55.1 kg)  ?06/09/21 119 lb (54 kg)  ?  ?General: Well nourished, well developed, in no acute distress ?Head: Atraumatic, normal size  ?Eyes: PEERLA, EOMI  ?Neck: Supple, no JVD ?Endocrine: No thryomegaly ?Cardiac: Normal S1, S2; irregular rhythm, no murmurs rubs or gallops ?Lungs: Clear to auscultation  bilaterally, no wheezing, rhonchi or rales  ?Abd: Soft, nontender, no hepatomegaly  ?Ext: No edema, pulses 2+ ?Musculoskeletal: No deformities, BUE and BLE strength normal and equal ?Skin: Warm and dry, no rashes   ?Neuro: Alert and oriented to person, place, time, and situation, CNII-XII grossly intact, no focal deficits  ?Psych: Normal mood and affect  ? ?ASSESSMENT:   ?Lindsay Rush is a 87 y.o. female who presents for the following: ?1. Chronic diastolic heart failure (HCC)   ?2. Mitral valve prolapse   ?3. Permanent atrial fibrillation (HCC)   ?4. Acquired thrombophilia (HCC)   ?5. Hypotension, unspecified hypotension type   ? ? ?PLAN:   ?1. Chronic diastolic heart failure (HCC) ?-Euvolemic on exam.  Continue Lasix 40  mg daily.  On potassium supplement. ? ?2. Mitral valve prolapse ?-Bileaflet prolapse.  Mild mitral regurgitation.  No significant murmur.  We will follow this periodically. ? ?3. Permanent atrial fibrillation (HCC) ?4. Acquired thrombophilia (HCC) ?-Long history of permanent atrial fibrillation.  On digoxin due to history of hypertension in the past.  She is well controlled today.  Given her ongoing stability we will continue with this for now.  She has normal kidney function.  I do not believe this is a big issue.  She is also on Xarelto 15 mg daily.  No major bleeding issues. ?-If she continues to demonstrate stability we can consider switching her to an AV nodal agent in the future.  For now we will continue with digoxin. ? ?5. Hypotension, unspecified hypotension type ?-This appears resolved.  She is off midodrine. ? ?Disposition: Return in about 1 year (around 08/05/2022). ? ?Medication Adjustments/Labs and Tests Ordered: ?Current medicines are reviewed at length with the patient today.  Concerns regarding medicines are outlined above.  ?No orders of the defined types were placed in this encounter. ? ?No orders of the defined types were placed in this encounter. ? ? ?Patient Instructions   ?Medication Instructions:  ?The current medical regimen is effective;  continue present plan and medications. ? ?*If you need a refill on your cardiac medications before your next appointment, please call your pharmac

## 2021-08-04 ENCOUNTER — Encounter: Payer: Self-pay | Admitting: Cardiovascular Disease

## 2021-08-04 ENCOUNTER — Ambulatory Visit: Payer: Medicare Other | Admitting: Cardiovascular Disease

## 2021-08-04 ENCOUNTER — Other Ambulatory Visit: Payer: Self-pay

## 2021-08-04 VITALS — BP 130/62 | HR 76 | Ht 62.0 in | Wt 119.6 lb

## 2021-08-04 DIAGNOSIS — D6869 Other thrombophilia: Secondary | ICD-10-CM | POA: Diagnosis not present

## 2021-08-04 DIAGNOSIS — I4821 Permanent atrial fibrillation: Secondary | ICD-10-CM

## 2021-08-04 DIAGNOSIS — I5032 Chronic diastolic (congestive) heart failure: Secondary | ICD-10-CM | POA: Diagnosis not present

## 2021-08-04 DIAGNOSIS — I341 Nonrheumatic mitral (valve) prolapse: Secondary | ICD-10-CM | POA: Diagnosis not present

## 2021-08-04 DIAGNOSIS — I959 Hypotension, unspecified: Secondary | ICD-10-CM

## 2021-08-04 NOTE — Patient Instructions (Signed)
Medication Instructions:  °The current medical regimen is effective;  continue present plan and medications. ° °*If you need a refill on your cardiac medications before your next appointment, please call your pharmacy* ° ° °Follow-Up: °At [facility], you and your health needs are our priority.  As part of our continuing mission to provide you with exceptional heart care, we have created designated Provider Care Teams.  These Care Teams include your primary Cardiologist (physician) and Advanced Practice Providers (APPs -  Physician Assistants and Nurse Practitioners) who all work together to provide you with the care you need, when you need it. ° °We recommend signing up for the patient portal called "MyChart".  Sign up information is provided on this After Visit Summary.  MyChart is used to connect with patients for Virtual Visits (Telemedicine).  Patients are able to view lab/test results, encounter notes, upcoming appointments, etc.  Non-urgent messages can be sent to your provider as well.   °To learn more about what you can do with MyChart, go to [email or web address].   ° °Your next appointment:   °12 month(s) ° °The format for your next appointment:   °In Person ° °Provider:   °[place] T O'Neal, MD   ° ° ° °

## 2021-10-29 ENCOUNTER — Emergency Department (HOSPITAL_COMMUNITY)
Admission: EM | Admit: 2021-10-29 | Discharge: 2021-10-29 | Disposition: A | Discharge status: Skilled Nursing Facility | Payer: Medicare Other | Attending: Emergency Medicine | Admitting: Emergency Medicine

## 2021-10-29 ENCOUNTER — Emergency Department (HOSPITAL_COMMUNITY): Payer: Medicare Other

## 2021-10-29 ENCOUNTER — Other Ambulatory Visit (HOSPITAL_COMMUNITY): Payer: Self-pay

## 2021-10-29 DIAGNOSIS — Z7901 Long term (current) use of anticoagulants: Secondary | ICD-10-CM | POA: Insufficient documentation

## 2021-10-29 DIAGNOSIS — S8002XA Contusion of left knee, initial encounter: Secondary | ICD-10-CM | POA: Insufficient documentation

## 2021-10-29 DIAGNOSIS — R519 Headache, unspecified: Secondary | ICD-10-CM | POA: Diagnosis not present

## 2021-10-29 DIAGNOSIS — W06XXXA Fall from bed, initial encounter: Secondary | ICD-10-CM | POA: Diagnosis not present

## 2021-10-29 DIAGNOSIS — Z96652 Presence of left artificial knee joint: Secondary | ICD-10-CM | POA: Insufficient documentation

## 2021-10-29 DIAGNOSIS — S8992XA Unspecified injury of left lower leg, initial encounter: Secondary | ICD-10-CM | POA: Diagnosis present

## 2021-10-29 MED ORDER — DICLOFENAC SODIUM 1 % EX GEL
4.0000 g | Freq: Four times a day (QID) | CUTANEOUS | 0 refills | Status: DC
Start: 2021-10-29 — End: 2024-01-28
  Filled 2021-10-29: qty 100, 6d supply, fill #0

## 2021-10-29 MED ORDER — OXYCODONE HCL 5 MG PO TABS
5.0000 mg | ORAL_TABLET | Freq: Once | ORAL | Status: AC
  Administered 2021-10-29: 5 mg via ORAL
  Filled 2021-10-29: qty 1

## 2021-10-29 MED ORDER — ACETAMINOPHEN 500 MG PO TABS
1000.0000 mg | ORAL_TABLET | Freq: Once | ORAL | Status: AC
  Administered 2021-10-29: 1000 mg via ORAL
  Filled 2021-10-29: qty 2

## 2021-10-29 NOTE — ED Notes (Signed)
Attempted to call handoff and arrange transportation back to facility message left for a callback

## 2021-10-29 NOTE — Discharge Instructions (Signed)
You have no broken bones in your knees, no bleeding inside the skull.  No broken bones in your neck.  Please follow-up with your family doctor in the office.  Please return for any worsening symptoms.  Return especially for confusion vomiting worsening headache.

## 2021-10-29 NOTE — ED Provider Notes (Signed)
[facility] EMERGENCY DEPARTMENT Provider Note   CSN: [id number] Arrival date & time: 10/29/21  4967     History  Chief Complaint  Patient presents with   Fall   Knee Injury    Lindsay Rush is a 87 y.o. female.  87 yo F with a chief complaints of fall.  Patient said she was trying to get a bed this morning and lost her balance fell to ground.  She struck the side of her neck onto a side table and landed on her bottom of her knees.  Bleeding mostly of left knee pain.  Has a history of a left knee replacement.  Was able to ambulate and bear weight afterwards.  Denies confusion denies vomiting denies chest pain shortness of breath denies abdominal pain.   Fall       Home Medications Prior to Admission medications   Medication Sig Start Date End Date Taking? Authorizing Provider  diclofenac Sodium (VOLTAREN) 1 % GEL Apply 4 g topically 4 (four) times daily. 10/29/21  Yes Melene Plan, DO  acetaminophen (TYLENOL) 325 MG tablet Take 650 mg by mouth every 6 (six) hours as needed for moderate pain or headache. Patient not taking: Reported on 08/04/2021    [provider]  ACETAMINOPHEN EXTRA STRENGTH 500 MG tablet Take 500 mg by mouth 3 (three) times daily. 07/28/21   [provider]  digoxin (LANOXIN) 0.125 MG tablet Take 1 tablet (0.125 mg total) by mouth daily. 04/08/21 08/04/21  Darlin Drop, DO  escitalopram (LEXAPRO) 5 MG tablet Take 5 mg by mouth daily.    [provider]  ezetimibe (ZETIA) 10 MG tablet Take 10 mg by mouth at bedtime.    [provider]  Fluticasone-Umeclidin-Vilant (TRELEGY ELLIPTA) 200-62.5-25 MCG/ACT AEPB Inhale 1 puff into the lungs daily.    [provider]  furosemide (LASIX) 40 MG tablet Take 1 tablet (40 mg total) by mouth daily. 04/07/21 08/04/21  Darlin Drop, DO  polyethylene glycol powder (GLYCOLAX/MIRALAX) 17 GM/SCOOP powder Take 17 g by mouth daily.    [provider]   potassium chloride (KLOR-CON) 20 MEQ packet Take 20 mEq by mouth daily. 04/08/21 08/04/21  Darlin Drop, DO  Rivaroxaban (XARELTO) 15 MG TABS tablet Take 15 mg by mouth daily.    [provider]  thyroid (ARMOUR) 30 MG tablet Take 30 mg by mouth daily before breakfast.    [provider]  traZODone (DESYREL) 50 MG tablet Take 50 mg by mouth at bedtime.    [provider]      Allergies    Codeine    Review of Systems   Review of Systems  Physical Exam Updated Vital Signs BP (!) 157/85   Pulse 74   Temp 98.1 F (36.7 C) (Oral)   Resp 16   SpO2 99%  Physical Exam Vitals and nursing note reviewed.  Constitutional:      General: She is not in acute distress.    Appearance: She is well-developed. She is not diaphoretic.  HENT:     Head: Normocephalic and atraumatic.  Eyes:     Pupils: Pupils are equal, round, and reactive to light.  Cardiovascular:     Rate and Rhythm: Normal rate and regular rhythm.     Heart sounds: No murmur heard.    No friction rub. No gallop.  Pulmonary:     Effort: Pulmonary effort is normal.     Breath sounds: No wheezing or rales.  Abdominal:     General: There is no distension.     Palpations: Abdomen is soft.     Tenderness: There is no abdominal tenderness.  Musculoskeletal:        General: No tenderness.     Cervical back: Normal range of motion and neck supple.     Comments: Has a bruise to the medial aspect of the left tibia.  Able to range the knee without significant pain.  Pulse motor and sensation intact distally.  Palpated from head to toe without any other noted areas of bony tenderness.  The patient has no bony tenderness along the C-spine but she tells me that it does hurt towards the base of the C-spine.  She is able to range her head 45 degrees in either direction without obvious pain.  Skin:    General: Skin is warm and dry.  Neurological:     Mental Status: She is alert and oriented to person, place,  and time.  Psychiatric:        Behavior: Behavior normal.     ED Results / Procedures / Treatments   Labs (all labs ordered are listed, but only abnormal results are displayed) Labs Reviewed - No data to display  EKG None  Radiology CT Head Wo Contrast  Result Date: 10/29/2021 CLINICAL DATA:  Status post fall.  Head and neck trauma. EXAM: CT HEAD WITHOUT CONTRAST CT CERVICAL SPINE WITHOUT CONTRAST TECHNIQUE: Multidetector CT imaging of the head and cervical spine was performed following the standard protocol without intravenous contrast. Multiplanar CT image reconstructions of the cervical spine were also generated. RADIATION DOSE REDUCTION: This exam was performed according to the departmental dose-optimization program which includes automated exposure control, adjustment of the mA and/or kV according to patient size and/or use of iterative reconstruction technique. COMPARISON:  CT head 04/05/2021.  CT cervical spine 04/03/2021. FINDINGS: CT HEAD FINDINGS Brain: There is no evidence of acute intracranial hemorrhage, mass lesion, brain edema or extra-axial fluid collection. Stable mild atrophy with prominence of the ventricles and subarachnoid spaces. Stable encephalomalacia inferiorly in the left frontal lobe and mild chronic periventricular white matter disease, likely due to chronic small vessel ischemic changes. There is no CT evidence of acute cortical infarction. Vascular: Intracranial vascular calcifications. No hyperdense vessel identified. Skull: Negative for fracture or focal lesion. Sinuses/Orbits: New right maxillary sinus mucosal thickening with possible small air-level. The additional visualized paranasal sinuses, mastoid air cells and middle ears are clear. Other: No facial fractures are identified. CT CERVICAL SPINE FINDINGS Despite efforts by the technologist and patient, mild motion artifact is present on today's exam and could not be eliminated. This reduces exam sensitivity and  specificity. Alignment: Stable and near anatomic. Minimal degenerative anterolisthesis at C4-5. Skull base and vertebrae: No evidence of acute cervical spine fracture or traumatic subluxation. Probable chronic interbody and interfacetal ankylosis at C3-4. Soft tissues and spinal canal: No prevertebral fluid or swelling. No visible canal hematoma. Disc levels: Stable relatively mild multilevel spondylosis with disc bulging, uncinate spurring and facet hypertrophy. Stable mild chronic foraminal narrowing at C5-6. Upper chest: Stable mild scarring at both lung apices. Bilateral carotid atherosclerosis. Other: None. IMPRESSION: 1. No acute intracranial or calvarial findings. 2. New right maxillary sinus mucosal thickening with possible small air-level. 3. No evidence of acute cervical spine fracture, traumatic subluxation or static signs of instability. 4. Mild spondylosis, without significant change. Electronically Signed   By: Carey Bullocks M.D.   On: 10/29/2021 10:00   CT Cervical  Spine Wo Contrast  Result Date: 10/29/2021 CLINICAL DATA:  Status post fall.  Head and neck trauma. EXAM: CT HEAD WITHOUT CONTRAST CT CERVICAL SPINE WITHOUT CONTRAST TECHNIQUE: Multidetector CT imaging of the head and cervical spine was performed following the standard protocol without intravenous contrast. Multiplanar CT image reconstructions of the cervical spine were also generated. RADIATION DOSE REDUCTION: This exam was performed according to the departmental dose-optimization program which includes automated exposure control, adjustment of the mA and/or kV according to patient size and/or use of iterative reconstruction technique. COMPARISON:  CT head 04/05/2021.  CT cervical spine 04/03/2021. FINDINGS: CT HEAD FINDINGS Brain: There is no evidence of acute intracranial hemorrhage, mass lesion, brain edema or extra-axial fluid collection. Stable mild atrophy with prominence of the ventricles and subarachnoid spaces. Stable  encephalomalacia inferiorly in the left frontal lobe and mild chronic periventricular white matter disease, likely due to chronic small vessel ischemic changes. There is no CT evidence of acute cortical infarction. Vascular: Intracranial vascular calcifications. No hyperdense vessel identified. Skull: Negative for fracture or focal lesion. Sinuses/Orbits: New right maxillary sinus mucosal thickening with possible small air-level. The additional visualized paranasal sinuses, mastoid air cells and middle ears are clear. Other: No facial fractures are identified. CT CERVICAL SPINE FINDINGS Despite efforts by the technologist and patient, mild motion artifact is present on today's exam and could not be eliminated. This reduces exam sensitivity and specificity. Alignment: Stable and near anatomic. Minimal degenerative anterolisthesis at C4-5. Skull base and vertebrae: No evidence of acute cervical spine fracture or traumatic subluxation. Probable chronic interbody and interfacetal ankylosis at C3-4. Soft tissues and spinal canal: No prevertebral fluid or swelling. No visible canal hematoma. Disc levels: Stable relatively mild multilevel spondylosis with disc bulging, uncinate spurring and facet hypertrophy. Stable mild chronic foraminal narrowing at C5-6. Upper chest: Stable mild scarring at both lung apices. Bilateral carotid atherosclerosis. Other: None. IMPRESSION: 1. No acute intracranial or calvarial findings. 2. New right maxillary sinus mucosal thickening with possible small air-level. 3. No evidence of acute cervical spine fracture, traumatic subluxation or static signs of instability. 4. Mild spondylosis, without significant change. Electronically Signed   By: Carey Bullocks M.D.   On: 10/29/2021 10:00   DG Knee Complete 4 Views Left  Result Date: 10/29/2021 CLINICAL DATA:  Fall from bed, knee pain EXAM: LEFT KNEE - COMPLETE 4+ VIEW; RIGHT KNEE - COMPLETE 4+ VIEW COMPARISON:  None Available. FINDINGS: No  fracture or dislocation of the bilateral knees. Status post left knee total arthroplasty without evidence of perihardware fracture or component malpositioning. 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 No knee joint effusion. Soft tissues unremarkable. IMPRESSION: 1. No fracture or dislocation of the bilateral knees. 2. Status post left knee total arthroplasty without evidence of perihardware fracture or component malpositioning. 3. 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 No knee joint effusion. Electronically Signed   By: Jearld Lesch M.D.   On: 10/29/2021 09:43   DG Knee Complete 4 Views Right  Result Date: 10/29/2021 CLINICAL DATA:  Fall from bed, knee pain EXAM: LEFT KNEE - COMPLETE 4+ VIEW; RIGHT KNEE - COMPLETE 4+ VIEW COMPARISON:  None Available. FINDINGS: No fracture or dislocation of the bilateral knees. Status post left knee total arthroplasty without evidence of perihardware fracture or component malpositioning. 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 No knee joint effusion. Soft tissues unremarkable. IMPRESSION: 1. No fracture  or dislocation of the bilateral knees. 2. Status post left knee total arthroplasty without evidence of perihardware fracture or component malpositioning. 3. 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 No knee joint effusion. Electronically Signed   By: Delanna Ahmadi M.D.   On: 10/29/2021 09:43   DG Pelvis Portable  Result Date: 10/29/2021 CLINICAL DATA:  Knee pain EXAM: PORTABLE PELVIS 1 VIEWS COMPARISON:  Pelvic radiograph dated April 03, 2021 FINDINGS: There is no evidence of pelvic fracture or diastasis. Mild degenerative changes of the bilateral hips. Degenerative changes of the partially visualized  lower lumbar spine. No pelvic bone lesions are seen. IMPRESSION: No acute osseous abnormality. Electronically Signed   By: Yetta Glassman M.D.   On: 10/29/2021 09:40    Procedures Procedures    Medications Ordered in ED Medications  acetaminophen (TYLENOL) tablet 1,000 mg (1,000 mg Oral Given 10/29/21 1028)  oxyCODONE (Oxy IR/ROXICODONE) immediate release tablet 5 mg (5 mg Oral Given 10/29/21 1028)    ED Course/ Medical Decision Making/ A&P                           Medical Decision Making Amount and/or Complexity of Data Reviewed Radiology: ordered.  Risk OTC drugs. Prescription drug management.   87 yo F with a chief complaints of a fall out of bed.  Sounds like she lost her balance, nonsyncopal.  Complain mostly of left knee pain.  She also did strike her head and neck.  We will obtain a CT of the head and C-spine.  Plain film of the knees.  Plain film the pelvis.  Plain film of the pelvis independently interpreted by me without fracture.  Plain film of the knees bilaterally independently interpreted by me without fracture or displacement of her hardware.  CT of the head and C-spine are negative.  Discussed results with patient.  Discharge home.  2:01 PM:  I have discussed the diagnosis/risks/treatment options with the patient.  Evaluation and diagnostic testing in the emergency department does not suggest an emergent condition requiring admission or immediate intervention beyond what has been performed at this time.  They will follow up with  PCP. We also discussed returning to the ED immediately if new or worsening sx occur. We discussed the sx which are most concerning (e.g., sudden worsening pain, fever, inability to tolerate by mouth) that necessitate immediate return. Medications administered to the patient during their visit and any new prescriptions provided to the patient are listed below.  Medications given during this visit Medications  acetaminophen (TYLENOL) tablet  1,000 mg (1,000 mg Oral Given 10/29/21 1028)  oxyCODONE (Oxy IR/ROXICODONE) immediate release tablet 5 mg (5 mg Oral Given 10/29/21 1028)     The patient appears reasonably screen and/or stabilized for discharge and I doubt any other medical condition or other [facility] requiring further screening, evaluation, or treatment in the ED at this time prior to discharge.          Final Clinical Impression(s) / ED Diagnoses Final diagnoses:  Fall from bed, initial encounter    Rx / DC Orders ED Discharge Orders          Ordered    diclofenac Sodium (VOLTAREN) 1 % GEL  4 times daily        10/29/21 [place], Gizella [place], DO 10/29/21 1401

## 2021-10-29 NOTE — ED Notes (Signed)
Up to [facility] with assistance.

## 2021-10-29 NOTE — ED Triage Notes (Signed)
Pt coming from [facility] via [place]. Pt states she fell out of bed at 0530. Pt states her neck rolled over the side of the bedside table but did not hit her head. Pt complaining of bilateral knee pain and left sided neck pain. Pt has hx of bilateral knee replacements. Pt denies LOC.

## 2021-11-06 ENCOUNTER — Other Ambulatory Visit (HOSPITAL_COMMUNITY): Payer: Self-pay

## 2021-11-14 ENCOUNTER — Telehealth: Payer: Self-pay | Admitting: Student

## 2021-11-14 NOTE — Telephone Encounter (Signed)
Sent my chart message. Can we schedule her an appointment with me and PFTs in 2-4 weeks?  Thanks!

## 2021-11-14 NOTE — Telephone Encounter (Signed)
I see she had a CT of the head recently due to a fall. I have the let provider know. I spoke with Maralyn Sago and she reports the patient will need the CT of the chest. I have secure chat Dr. Thora Lance and he wants to see  the message.

## 2021-11-18 ENCOUNTER — Ambulatory Visit (HOSPITAL_BASED_OUTPATIENT_CLINIC_OR_DEPARTMENT_OTHER)
Admission: RE | Admit: 2021-11-18 | Discharge: 2021-11-18 | Disposition: A | Discharge status: Home / Self Care | Payer: Medicare Other | Source: Ambulatory Visit | Attending: Student | Admitting: Student

## 2021-11-18 DIAGNOSIS — I517 Cardiomegaly: Secondary | ICD-10-CM | POA: Diagnosis not present

## 2021-11-18 DIAGNOSIS — I2510 Atherosclerotic heart disease of native coronary artery without angina pectoris: Secondary | ICD-10-CM | POA: Insufficient documentation

## 2021-11-18 DIAGNOSIS — J849 Interstitial pulmonary disease, unspecified: Secondary | ICD-10-CM | POA: Diagnosis present

## 2021-11-18 DIAGNOSIS — J449 Chronic obstructive pulmonary disease, unspecified: Secondary | ICD-10-CM | POA: Diagnosis not present

## 2021-11-18 DIAGNOSIS — R918 Other nonspecific abnormal finding of lung field: Secondary | ICD-10-CM | POA: Insufficient documentation

## 2021-11-18 DIAGNOSIS — I700 Atherosclerosis of aorta: Secondary | ICD-10-CM | POA: Diagnosis not present

## 2021-12-08 ENCOUNTER — Encounter (INDEPENDENT_AMBULATORY_CARE_PROVIDER_SITE_OTHER): Payer: Self-pay | Admitting: Ophthalmology

## 2021-12-08 ENCOUNTER — Ambulatory Visit (INDEPENDENT_AMBULATORY_CARE_PROVIDER_SITE_OTHER): Payer: Medicare Other | Admitting: Ophthalmology

## 2021-12-08 DIAGNOSIS — Z961 Presence of intraocular lens: Secondary | ICD-10-CM | POA: Diagnosis not present

## 2021-12-08 DIAGNOSIS — H4311 Vitreous hemorrhage, right eye: Secondary | ICD-10-CM | POA: Diagnosis not present

## 2021-12-08 DIAGNOSIS — I10 Essential (primary) hypertension: Secondary | ICD-10-CM

## 2021-12-08 DIAGNOSIS — H35033 Hypertensive retinopathy, bilateral: Secondary | ICD-10-CM | POA: Diagnosis not present

## 2021-12-08 NOTE — Progress Notes (Signed)
[facility] Note  12/08/2021     CHIEF COMPLAINT Patient presents for Retina Evaluation   HISTORY OF PRESENT ILLNESS: Lindsay Rush is a 87 y.o. female who presents to the clinic today for:   HPI     Retina Evaluation   In right eye.  This started 1 year ago.  Associated Symptoms Floaters.  Negative for Flashes and Blind Spot.  Context:  distance vision, mid-range vision, near vision, reading and watching TV.  I, the attending physician,  performed the HPI with the patient and updated documentation appropriately.        Comments   Patient was referred by Dr. Valetta Close for Vit heme OD. She states that she is having difficulty seeing with the right eye.       Last edited by Bernarda Caffey, MD on 12/08/2021 11:52 AM.    Pt is here on the referral of Dr. Valetta Close for concern of vitreous heme OD, pt states she saw him for the first time bc of this problem, pts daughter states she used to live in [facility], but recently moved up here into an assisted living facility, pt is unsure when her floaters started, but she feels like over the past several months they have gotten worse, she denies fol, pt is on Xarelto for A fib, pts daughter states that pt has fallen "3 or 4 times since October" and gone to the ED at [facility]  Referring physician: Jola Schmidt, MD [place],   [id number]  HISTORICAL INFORMATION:   Selected notes from the MEDICAL RECORD NUMBER Referred by Dr. Valetta Close for vitreous heme OD LEE:  Ocular Hx- PMH-    CURRENT MEDICATIONS: No current outpatient medications on file. (Ophthalmic Drugs)   No current facility-administered medications for this visit. (Ophthalmic Drugs)   Current Outpatient Medications (Other)  Medication Sig   acetaminophen (TYLENOL) 325 MG tablet Take 650 mg by mouth every 6 (six) hours as needed for moderate pain or headache. (Patient not taking: Reported on 08/04/2021)   ACETAMINOPHEN EXTRA STRENGTH 500 MG tablet  Take 500 mg by mouth 3 (three) times daily.   diclofenac Sodium (VOLTAREN) 1 % GEL Apply 4 g topically 4 (four) times daily.   digoxin (LANOXIN) 0.125 MG tablet Take 1 tablet (0.125 mg total) by mouth daily.   escitalopram (LEXAPRO) 5 MG tablet Take 5 mg by mouth daily.   ezetimibe (ZETIA) 10 MG tablet Take 10 mg by mouth at bedtime.   Fluticasone-Umeclidin-Vilant (TRELEGY ELLIPTA) 200-62.5-25 MCG/ACT AEPB Inhale 1 puff into the lungs daily.   furosemide (LASIX) 40 MG tablet Take 1 tablet (40 mg total) by mouth daily.   polyethylene glycol powder (GLYCOLAX/MIRALAX) 17 GM/SCOOP powder Take 17 g by mouth daily.   potassium chloride (KLOR-CON) 20 MEQ packet Take 20 mEq by mouth daily.   Rivaroxaban (XARELTO) 15 MG TABS tablet Take 15 mg by mouth daily.   thyroid (ARMOUR) 30 MG tablet Take 30 mg by mouth daily before breakfast.   traZODone (DESYREL) 50 MG tablet Take 50 mg by mouth at bedtime.   No current facility-administered medications for this visit. (Other)   REVIEW OF SYSTEMS: ROS   Positive for: Eyes Last edited by Annie Paras, COT on 12/08/2021  9:09 AM.     ALLERGIES Allergies  Allergen Reactions   Codeine    PAST MEDICAL HISTORY Past Medical History:  Diagnosis Date   Atrial fibrillation (HCC)    Chronic diastolic CHF (  congestive heart failure) (HCC)    Chronic hypotension    COPD (chronic obstructive pulmonary disease) (HCC)    Dementia (HCC)    Fall    Hyperlipidemia    Hypertension    Hypothyroidism    Insomnia    MCI (mild cognitive impairment)    Thyrotoxicosis    Past Surgical History:  Procedure Laterality Date   ABDOMINAL HYSTERECTOMY     APPENDECTOMY     CHOLECYSTECTOMY     left knee replacement  12/2020   FAMILY HISTORY Family History  Problem Relation Age of Onset   Heart attack Father    SOCIAL HISTORY Social History   Tobacco Use   Smoking status: Never   Smokeless tobacco: Never  Vaping Use   Vaping Use: Every day  Substance  Use Topics   Alcohol use: Never   Drug use: Never    Comment: unable to answer        OPHTHALMIC EXAM:  Base Eye Exam     Visual Acuity (Snellen - Linear)       Right Left   Dist [place] 20/150 20/40 -2   Dist ph [place] 20/50 -2 20/25         Tonometry (Tonopen, 9:16 AM)       Right Left   Pressure 19 16         Pupils       Pupils Dark Light Shape React APD   Right PERRL 3 2 Round Minimal None   Left PERRL 3 2 Round Minimal None         Visual Fields       Left Right    Full Full         Extraocular Movement       Right Left    Full, Ortho Full, Ortho         Neuro/Psych     Oriented x3: Yes         Dilation     Both eyes: 1.0% Mydriacyl, 2.5% Phenylephrine @ 9:10 AM           Slit Lamp and Fundus Exam     Slit Lamp Exam       Right Left   Lids/Lashes Dermatochalasis - upper lid Dermatochalasis - upper lid, Meibomian gland dysfunction   Conjunctiva/Sclera White and quiet White and quiet   Cornea Trace tear film debris, well healed cataract wound Trace tear film debris, well healed cataract wound   Anterior Chamber deep, clear, narrow temporal angle deep and clear   Iris Round and dilated Round and moderately dilated   Lens PC IOL in good position with open PC 3 piece PC IOL in good position with open PC   Anterior Vitreous Vitreous syneresis, +RBC's, blood stained vitreous condensations, white VH settling inferiorly Vitreous syneresis, Posterior vitreous detachment         Fundus Exam       Right Left   Disc mild Pallor, Sharp rim mild Pallor, Sharp rim, Compact   C/D Ratio 0.4 0.1   Macula Flat, Blunted foveal reflex, No heme or edema Flat, Blunted foveal reflex, RPE mottling and clumping, No heme or edema   Vessels attenuated, mild tortuosity mild attenuation, mild tortuosity   Periphery Attached; inferior retina obscured by VH; pigmented CR scar at 1100 -- ?old tear Attached, mild reticular degeneration            Refraction     Manifest Refraction  Sphere Cylinder Axis Dist VA   Right +0.25 -3.00 115 20/50   Left +0.25 -1.25 060 20/25            IMAGING AND PROCEDURES  Imaging and Procedures for 12/08/2021  OCT, Retina - OU - Both Eyes       Right Eye Quality was good. Central Foveal Thickness: 272. Progression has no prior data. Findings include normal foveal contour, no IRF, no SRF (Trace ERM, vitreous opacities).   Left Eye Quality was good. Central Foveal Thickness: 267. Progression has no prior data. Findings include normal foveal contour, no IRF, no SRF.   Notes *Images captured and stored on drive  Diagnosis / Impression:  NFP, no IRF / SRF OU OD: +vitreous opacities   Clinical management:  See below  Abbreviations: NFP - Normal foveal profile. CME - cystoid macular edema. PED - pigment epithelial detachment. IRF - intraretinal fluid. SRF - subretinal fluid. EZ - ellipsoid zone. ERM - epiretinal membrane. ORA - outer retinal atrophy. ORT - outer retinal tubulation. SRHM - subretinal hyper-reflective material. IRHM - intraretinal hyper-reflective material            ASSESSMENT/PLAN:    ICD-10-CM   1. Vitreous hemorrhage of right eye (HCC)  H43.11 OCT, Retina - OU - Both Eyes    2. Pseudophakia, both eyes  Z96.1     3. Essential hypertension  I10     4. Hypertensive retinopathy of both eyes  H35.033      Vitreous hemorrhage OD - pt and daughter report several month history of floaters in vision OD - pt with dementia and mild cognitive impairment -- poor historian - moderate blood stained vitreous condensations w/ some old white VH settled inferiorly - unclear etiology -- no history of DM, but on Xarelto for Afib and history of multiple falls over the last several months--most recently in June 2023 w/ normal head CT - suspect hemorrhagic PVD vs trauma + blood thinner or combination - discussed findings, thoughts, and treatment options - VH precautions  reviewed -- minimize activities, keep head elevated, avoid ASA/NSAIDs/blood thinners as able - f/u 2 weeks, DFE, OCT  2,3. Hypertensive retinopathy OU - discussed importance of tight BP control - monitor   4. Pseudophakia OU  - s/p CE/IOL OU ([place])  - IOL in good position, doing well  - monitor  Ophthalmic Meds Ordered this visit:  No orders of the defined types were placed in this encounter.    Return in about 2 weeks (around 12/22/2021) for VH OD, DFE, OCT.  There are no Patient Instructions on file for this visit.   Explained the diagnoses, plan, and follow up with the patient and they expressed understanding.  Patient expressed understanding of the importance of proper follow up care.   This document serves as a record of services personally performed by Gardiner Sleeper, MD, PhD. It was created on their behalf by San Jetty. Owens Shark, OA an ophthalmic technician. The creation of this record is the provider's dictation and/or activities during the visit.    Electronically signed by: San Jetty. Owens Shark, [place] 07.31.2023 12:04 PM   Gardiner Sleeper, M.D., Ph.D. Diseases & Surgery of the Retina and Vitreous [facility]  I have reviewed the above documentation for accuracy and completeness, and I agree with the above. Gardiner Sleeper, M.D., Ph.D. 12/08/21 12:04 PM   Abbreviations: M myopia (nearsighted); A astigmatism; H hyperopia (farsighted); P presbyopia; Mrx spectacle prescription;  CTL contact  lenses; OD right eye; OS left eye; OU both eyes  XT exotropia; ET esotropia; PEK punctate epithelial keratitis; PEE punctate epithelial erosions; DES dry eye syndrome; MGD meibomian gland dysfunction; ATs artificial tears; PFAT's preservative free artificial tears; NSC nuclear sclerotic cataract; PSC posterior subcapsular cataract; ERM epi-retinal membrane; PVD posterior vitreous detachment; RD retinal detachment; DM diabetes mellitus; DR diabetic retinopathy; NPDR  non-proliferative diabetic retinopathy; PDR proliferative diabetic retinopathy; CSME clinically significant macular edema; DME diabetic macular edema; dbh dot blot hemorrhages; CWS cotton wool spot; POAG primary open angle glaucoma; C/D cup-to-disc ratio; HVF humphrey visual field; GVF goldmann visual field; OCT optical coherence tomography; IOP intraocular pressure; BRVO Branch retinal vein occlusion; CRVO central retinal vein occlusion; CRAO central retinal artery occlusion; BRAO branch retinal artery occlusion; RT retinal tear; SB scleral buckle; PPV pars plana vitrectomy; VH Vitreous hemorrhage; PRP panretinal laser photocoagulation; IVK intravitreal kenalog; VMT vitreomacular traction; MH Macular hole;  NVD neovascularization of the disc; NVE neovascularization elsewhere; AREDS age related eye disease study; ARMD age related macular degeneration; POAG primary open angle glaucoma; EBMD epithelial/anterior basement membrane dystrophy; ACIOL anterior chamber intraocular lens; IOL intraocular lens; PCIOL posterior chamber intraocular lens; Phaco/IOL phacoemulsification with intraocular lens placement; PRK photorefractive keratectomy; LASIK laser assisted in situ keratomileusis; HTN hypertension; DM diabetes mellitus; COPD chronic obstructive pulmonary disease

## 2021-12-16 ENCOUNTER — Ambulatory Visit (INDEPENDENT_AMBULATORY_CARE_PROVIDER_SITE_OTHER): Payer: Medicare Other | Admitting: Student

## 2021-12-16 DIAGNOSIS — J849 Interstitial pulmonary disease, unspecified: Secondary | ICD-10-CM | POA: Diagnosis not present

## 2021-12-16 LAB — PULMONARY FUNCTION TEST
DL/VA % pred: 91 %
DL/VA: 3.76 ml/min/mmHg/L
DLCO cor % pred: 63 %
DLCO cor: 11.07 ml/min/mmHg
DLCO unc % pred: 63 %
DLCO unc: 11.07 ml/min/mmHg
FEF 25-75 Post: 0.83 L/sec
FEF 25-75 Pre: 1.09 L/sec
FEF2575-%Change-Post: -23 %
FEF2575-%Pred-Post: 76 %
FEF2575-%Pred-Pre: 99 %
FEV1-%Change-Post: -7 %
FEV1-%Pred-Post: 65 %
FEV1-%Pred-Pre: 70 %
FEV1-Post: 1.06 L
FEV1-Pre: 1.14 L
FEV1FVC-%Change-Post: -17 %
FEV1FVC-%Pred-Pre: 109 %
FEV6-%Change-Post: 11 %
FEV6-%Pred-Post: 77 %
FEV6-%Pred-Pre: 69 %
FEV6-Post: 1.6 L
FEV6-Pre: 1.43 L
FEV6FVC-%Pred-Post: 106 %
FEV6FVC-%Pred-Pre: 106 %
FVC-%Change-Post: 12 %
FVC-%Pred-Post: 73 %
FVC-%Pred-Pre: 65 %
FVC-Post: 1.61 L
FVC-Pre: 1.43 L
Post FEV1/FVC ratio: 66 %
Post FEV6/FVC ratio: 100 %
Pre FEV1/FVC ratio: 80 %
Pre FEV6/FVC Ratio: 100 %
RV % pred: 60 %
RV: 1.43 L
TLC % pred: 62 %
TLC: 2.99 L

## 2021-12-16 NOTE — Progress Notes (Signed)
Full PFT performed today. °

## 2021-12-16 NOTE — Progress Notes (Signed)
[facility] - Clinic Note  12/23/2021     CHIEF COMPLAINT Patient presents for Retina Follow Up   HISTORY OF PRESENT ILLNESS: Lindsay Rush is a 87 y.o. female who presents to the clinic today for:   HPI     Retina Follow Up   Patient presents with  Other.  In right eye.  This started weeks ago.  Duration of 2 weeks.  I, the attending physician,  performed the HPI with the patient and updated documentation appropriately.        Comments   Patient feels that the vision is the same since her last visit 2 weeks ago.       Last edited by Rennis Chris, MD on 12/24/2021 12:30 PM.    Pt states that the blood in her right eye doesn't seem any better, she states she cannot sleep with her head elevated, so she is waking up laying on her back, no new fol  Referring physician: Joycelyn Man, NP [id number] [place],  [place] [address]  HISTORICAL INFORMATION:   Selected notes from the MEDICAL RECORD NUMBER Referred by Dr. Cathey Endow for vitreous heme OD LEE:  Ocular Hx- PMH-    CURRENT MEDICATIONS: No current outpatient medications on file. (Ophthalmic Drugs)   No current facility-administered medications for this visit. (Ophthalmic Drugs)   Current Outpatient Medications (Other)  Medication Sig   ACETAMINOPHEN EXTRA STRENGTH 500 MG tablet Take 500 mg by mouth 3 (three) times daily.   diclofenac Sodium (VOLTAREN) 1 % GEL Apply 4 g topically 4 (four) times daily.   digoxin (LANOXIN) 0.125 MG tablet Take 1 tablet (0.125 mg total) by mouth daily.   escitalopram (LEXAPRO) 5 MG tablet Take 5 mg by mouth daily.   ezetimibe (ZETIA) 10 MG tablet Take 10 mg by mouth at bedtime.   Fluticasone-Umeclidin-Vilant (TRELEGY ELLIPTA) 200-62.5-25 MCG/ACT AEPB Inhale 1 puff into the lungs daily.   furosemide (LASIX) 40 MG tablet Take 1 tablet (40 mg total) by mouth daily.   polyethylene glycol powder (GLYCOLAX/MIRALAX) 17 GM/SCOOP powder Take 17 g by mouth  daily.   potassium chloride (KLOR-CON) 20 MEQ packet Take 20 mEq by mouth daily.   Rivaroxaban (XARELTO) 15 MG TABS tablet Take 15 mg by mouth daily.   thyroid (ARMOUR) 30 MG tablet Take 30 mg by mouth daily before breakfast.   traZODone (DESYREL) 50 MG tablet Take 50 mg by mouth at bedtime.   No current facility-administered medications for this visit. (Other)   REVIEW OF SYSTEMS: ROS   Positive for: Eyes Last edited by Julieanne Cotton, COT on 12/23/2021  1:48 PM.     ALLERGIES Allergies  Allergen Reactions   Codeine    PAST MEDICAL HISTORY Past Medical History:  Diagnosis Date   Atrial fibrillation (HCC)    Chronic diastolic CHF (congestive heart failure) (HCC)    Chronic hypotension    COPD (chronic obstructive pulmonary disease) (HCC)    Dementia (HCC)    Fall    Hyperlipidemia    Hypertension    Hypothyroidism    Insomnia    MCI (mild cognitive impairment)    Thyrotoxicosis    Past Surgical History:  Procedure Laterality Date   ABDOMINAL HYSTERECTOMY     APPENDECTOMY     CHOLECYSTECTOMY     left knee replacement  12/2020   FAMILY HISTORY Family History  Problem Relation Age of Onset   Heart attack Father    SOCIAL HISTORY  Social History   Tobacco Use   Smoking status: Never   Smokeless tobacco: Never  Vaping Use   Vaping Use: Every day  Substance Use Topics   Alcohol use: Never   Drug use: Never    Comment: unable to answer       OPHTHALMIC EXAM:  Base Eye Exam     Visual Acuity (Snellen - Linear)       Right Left   Dist [place] 20/100 20/40   Dist ph [place] 20/50 20/30         Tonometry (Tonopen, 1:56 PM)       Right Left   Pressure 14 16         Pupils       Dark Light Shape React APD   Right 3 2 Round Minimal None   Left 3 2 Round Minimal None         Visual Fields       Left Right    Full Full         Extraocular Movement       Right Left    Full, Ortho Full, Ortho         Neuro/Psych     Oriented x3:  Yes         Dilation     Both eyes: 1.0% Mydriacyl, 2.5% Phenylephrine @ 1:49 PM           Slit Lamp and Fundus Exam     Slit Lamp Exam       Right Left   Lids/Lashes Dermatochalasis - upper lid Dermatochalasis - upper lid, Meibomian gland dysfunction   Conjunctiva/Sclera White and quiet White and quiet   Cornea well healed cataract wound, trace PEE Trace tear film debris, well healed cataract wound   Anterior Chamber deep, clear, narrow temporal angle deep and clear   Iris Round and dilated Round and moderately dilated   Lens PC IOL in good position with open PC 3 piece PC IOL in good position with open PC   Anterior Vitreous Vitreous syneresis, +RBC's, blood stained vitreous condensations, white VH settling inferiorly, mild interval improvement in diffuse VH, blood clots turning white and settling inferiorly Vitreous syneresis, Posterior vitreous detachment         Fundus Exam       Right Left   Disc mild Pallor, Sharp rim mild Pallor, Sharp rim, Compact   C/D Ratio 0.4 0.2   Macula Flat, Blunted foveal reflex, No heme or edema Flat, Blunted foveal reflex, fine drusen, RPE mottling and clumping, No heme or edema   Vessels attenuated attenuated, Tortuous   Periphery Attached; inferior retina obscured by VH; pigmented CR scar at 1100 -- ?old tear Attached, mild reticular degeneration           Refraction     Manifest Refraction       Sphere Cylinder Axis Dist VA   Right +0.25 -3.00 115 20/50   Left +0.25 -1.25 060 20/25           IMAGING AND PROCEDURES  Imaging and Procedures for 12/23/2021  OCT, Retina - OU - Both Eyes       Right Eye Quality was good. Central Foveal Thickness: 274. Progression has improved. Findings include normal foveal contour, no IRF, no SRF (Trace ERM, persistent vitreous opacities -- slightly improved).   Left Eye Quality was good. Central Foveal Thickness: 273. Progression has been stable. Findings include normal foveal  contour, no IRF, no SRF.  Notes *Images captured and stored on drive  Diagnosis / Impression:  NFP, no IRF / SRF OU OD: persistent vitreous opacities -- slightly improved  Clinical management:  See below  Abbreviations: NFP - Normal foveal profile. CME - cystoid macular edema. PED - pigment epithelial detachment. IRF - intraretinal fluid. SRF - subretinal fluid. EZ - ellipsoid zone. ERM - epiretinal membrane. ORA - outer retinal atrophy. ORT - outer retinal tubulation. SRHM - subretinal hyper-reflective material. IRHM - intraretinal hyper-reflective material            ASSESSMENT/PLAN:    ICD-10-CM   1. Vitreous hemorrhage of right eye (HCC)  H43.11 OCT, Retina - OU - Both Eyes    2. Pseudophakia, both eyes  Z96.1     3. Essential hypertension  I10     4. Hypertensive retinopathy of both eyes  H35.033      Vitreous hemorrhage OD - pt and daughter report several month history of floaters in vision OD - pt with dementia and mild cognitive impairment -- poor historian - moderate blood stained vitreous condensations w/ some old white VH settled inferiorly - today -- pt reports difficulty with sleeping with head elevated; exam shows mild interval improvement in [facility] - unclear etiology -- no history of DM, but on Xarelto for Afib and history of multiple falls over the last several months--most recently in June 2023 w/ normal head CT - suspect hemorrhagic PVD vs trauma + blood thinner or combination - discussed findings, thoughts, and treatment options - VH precautions reviewed -- minimize activities, keep head elevated, avoid ASA/NSAIDs/blood thinners as able - f/u 2 weeks, DFE, OCT  2,3. Hypertensive retinopathy OU - discussed importance of tight BP control - monitor   4. Pseudophakia OU  - s/p CE/IOL OU ([place])  - IOL in good position, doing well  - monitor  Ophthalmic Meds Ordered this visit:  No orders of the defined types were placed in this encounter.     Return in about 2 weeks (around 01/06/2022) for f/u VH OD, DFE, OCT.  There are no Patient Instructions on file for this visit.  Explained the diagnoses, plan, and follow up with the patient and they expressed understanding.  Patient expressed understanding of the importance of proper follow up care.   This document serves as a record of services personally performed by Karie Chimera, MD, PhD. It was created on their behalf by Gerilyn Nestle, COT an ophthalmic technician. The creation of this record is the provider's dictation and/or activities during the visit.    Electronically signed by:  Gerilyn Nestle, COT  12/16/21 12:30 PM   This document serves as a record of services personally performed by Karie Chimera, MD, PhD. It was created on their behalf by Glee Arvin. Manson Passey, OA an ophthalmic technician. The creation of this record is the provider's dictation and/or activities during the visit.    Electronically signed by: Glee Arvin. Kristopher Oppenheim 08.15.2023 12:30 PM  Karie Chimera, M.D., Ph.D. Diseases & Surgery of the Retina and Vitreous [facility]  I have reviewed the above documentation for accuracy and completeness, and I agree with the above. Karie Chimera, M.D., Ph.D. 12/24/21 12:32 PM  Abbreviations: M myopia (nearsighted); A astigmatism; H hyperopia (farsighted); P presbyopia; Mrx spectacle prescription;  CTL contact lenses; OD right eye; OS left eye; OU both eyes  XT exotropia; ET esotropia; PEK punctate epithelial keratitis; PEE punctate epithelial erosions; DES dry eye syndrome; MGD meibomian  gland dysfunction; ATs artificial tears; PFAT's preservative free artificial tears; NSC nuclear sclerotic cataract; PSC posterior subcapsular cataract; ERM epi-retinal membrane; PVD posterior vitreous detachment; RD retinal detachment; DM diabetes mellitus; DR diabetic retinopathy; NPDR non-proliferative diabetic retinopathy; PDR proliferative diabetic  retinopathy; CSME clinically significant macular edema; DME diabetic macular edema; dbh dot blot hemorrhages; CWS cotton wool spot; POAG primary open angle glaucoma; C/D cup-to-disc ratio; HVF humphrey visual field; GVF goldmann visual field; OCT optical coherence tomography; IOP intraocular pressure; BRVO Branch retinal vein occlusion; CRVO central retinal vein occlusion; CRAO central retinal artery occlusion; BRAO branch retinal artery occlusion; RT retinal tear; SB scleral buckle; PPV pars plana vitrectomy; VH Vitreous hemorrhage; PRP panretinal laser photocoagulation; IVK intravitreal kenalog; VMT vitreomacular traction; MH Macular hole;  NVD neovascularization of the disc; NVE neovascularization elsewhere; AREDS age related eye disease study; ARMD age related macular degeneration; POAG primary open angle glaucoma; EBMD epithelial/anterior basement membrane dystrophy; ACIOL anterior chamber intraocular lens; IOL intraocular lens; PCIOL posterior chamber intraocular lens; Phaco/IOL phacoemulsification with intraocular lens placement; PRK photorefractive keratectomy; LASIK laser assisted in situ keratomileusis; HTN hypertension; DM diabetes mellitus; COPD chronic obstructive pulmonary disease

## 2021-12-16 NOTE — Progress Notes (Unsigned)
Synopsis: Referred for COPD by Joycelyn Man*  Subjective:   PATIENT ID: Lindsay Rush GENDER: female DOB: 1934-06-11, MRN: [id number]  No chief complaint on file.  86yF with history of AF, dCHF, COPD, hypothyroid, HTN and now hypotension, dementia, vaping referred for COPD. Did have covid-19 in 04/2021, they do not think she took paxlovid though she did have nausea and cough.   Hospitalized and discharged 04/08/21 for fall, CAP and encephalopathy, acute diastolic heart failure.   She says her breathing is fine. Does not report DOE when she does walk around assisted. She does have some dry cough. She has no chest pain. She does wonder if she has had raynaud's phenomenon.   She is unsure whether trelegy is making difference for her breathing.  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 Has done some gardening in past. Has lived in [place], [place], [place]. [place]. Never smoker.   Interval HPI:  Repeat Ct Chest without clear progression of fibrotic lung disease of unclear etiology. Serologies unremarkable at last visit. PFTs  Otherwise pertinent review of systems is negative.  Past Medical History:  Diagnosis Date   Atrial fibrillation (HCC)    Chronic diastolic CHF (congestive heart failure) (HCC)    Chronic hypotension    COPD (chronic obstructive pulmonary disease) (HCC)    Dementia (HCC)    Fall    Hyperlipidemia    Hypertension    Hypothyroidism    Insomnia    MCI (mild cognitive impairment)    Thyrotoxicosis      Family History  Problem Relation Age of Onset   Heart attack Father      Past Surgical History:  Procedure Laterality Date   ABDOMINAL HYSTERECTOMY     APPENDECTOMY     CHOLECYSTECTOMY     left knee replacement  12/2020    Social History   Socioeconomic History   Marital status: Married    Spouse name: Not on file   Number of children: 2   Years of education: Not on file   Highest  education level: High school graduate  Occupational History    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  Tobacco Use   Smoking status: Never   Smokeless tobacco: Never  Vaping Use   Vaping Use: Every day  Substance and Sexual Activity   Alcohol use: Never   Drug use: Never    Comment: unable to answer   Sexual activity: Not Currently  Other Topics Concern   Not on file  Social History Narrative   06/11/21 resides at [facility]   Social Determinants of Health   Financial Resource Strain: Not on file  Food Insecurity: Not on file  Transportation Needs: Not on file  Physical Activity: Not on file  Stress: Not on file  Social Connections: Not on file  Intimate Partner Violence: Not on file     Allergies  Allergen Reactions   Codeine      Outpatient Medications Prior to Visit  Medication Sig Dispense Refill   acetaminophen (TYLENOL) 325 MG tablet Take 650 mg by mouth every 6 (six) hours as needed for moderate pain or headache. (Patient not taking: Reported on 08/04/2021)     ACETAMINOPHEN EXTRA STRENGTH 500 MG tablet Take 500 mg by mouth 3 (three) times daily.     diclofenac Sodium (VOLTAREN) 1 % GEL Apply 4 g topically 4 (four) times daily. 100 g 0   digoxin (LANOXIN) 0.125 MG  tablet Take 1 tablet (0.125 mg total) by mouth daily. 30 tablet 0   escitalopram (LEXAPRO) 5 MG tablet Take 5 mg by mouth daily.     ezetimibe (ZETIA) 10 MG tablet Take 10 mg by mouth at bedtime.     Fluticasone-Umeclidin-Vilant (TRELEGY ELLIPTA) 200-62.5-25 MCG/ACT AEPB Inhale 1 puff into the lungs daily.     furosemide (LASIX) 40 MG tablet Take 1 tablet (40 mg total) by mouth daily. 30 tablet 0   polyethylene glycol powder (GLYCOLAX/MIRALAX) 17 GM/SCOOP powder Take 17 g by mouth daily.     potassium chloride (KLOR-CON) 20 MEQ packet Take 20 mEq by mouth daily. 30 packet 0   Rivaroxaban (XARELTO) 15 MG TABS tablet Take 15 mg by mouth daily.     thyroid (ARMOUR) 30 MG tablet Take 30 mg by mouth daily  before breakfast.     traZODone (DESYREL) 50 MG tablet Take 50 mg by mouth at bedtime.     No facility-administered medications prior to visit.       Objective:   Physical Exam:  General appearance: 87 y.o., female, NAD, conversant  Eyes: anicteric sclerae; PERRL, tracking appropriately HENT: NCAT; MMM Neck: Trachea midline; no lymphadenopathy, no JVD Lungs: CTAB, no crackles, no wheeze, with normal respiratory effort CV: RRR, no murmur  Abdomen: Soft, non-tender; non-distended, BS present  Extremities: No peripheral edema, warm Skin: Normal turgor and texture; no rash Psych: Appropriate affect Neuro: Alert and oriented to person and place, no focal deficit     There were no vitals filed for this visit.    on RA BMI Readings from Last 3 Encounters:  08/04/21 21.88 kg/m  06/11/21 22.20 kg/m  06/09/21 21.77 kg/m   Wt Readings from Last 3 Encounters:  08/04/21 119 lb 9.6 oz (54.3 kg)  06/11/21 121 lb 6.4 oz (55.1 kg)  06/09/21 119 lb (54 kg)     CBC    Component Value Date/Time   WBC 10.5 04/06/2021 0041   RBC 4.20 04/06/2021 0041   HGB 12.9 04/06/2021 0041   HCT 39.9 04/06/2021 0041   PLT 283 04/06/2021 0041   MCV 95.0 04/06/2021 0041   MCH 30.7 04/06/2021 0041   MCHC 32.3 04/06/2021 0041   RDW 15.0 04/06/2021 0041   LYMPHSABS 2.5 04/06/2021 0041   MONOABS 1.0 04/06/2021 0041   EOSABS 0.2 04/06/2021 0041   BASOSABS 0.1 04/06/2021 0041    Chest Imaging: CT Chest 04/03/21 reviewed by me remarkable for huge LA, interlobular septal thickening, traction bronchiectasis, mosaicism, adenopathy likely exaggerated by volume overload  HRCT Chest 11/19/21 reviewed by me with multifocal ggo without much change during expiration, cylindrical bronchiectasis, cardiomegaly/enlarged bl atria - doesn't appear to have substantial progressive fibrotic lung disease by my read  Pulmonary Functions Testing Results:     No data to display            Echocardiogram:    TTE 04/06/21 indeterminate for diastolic dysfunction, mildly reduced RVSF, myxomatous MV, moderate MR, , moderate TR     Assessment & Plan:   # 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 CTD-ILD possible but few extrapulmonary symptoms suggestive. If mosaicism due to ILD then could think of HP and in past she did kind of have bird exposure but no recent exposures. Recurrent aspiration possible but upper lobe predominance seems to make this a bit less likely. Regardless apart from some cough which  doesn't seem to bother her much it's not clear that it's affecting her day to day life yet.  # Thrush  Plan: - RF, CCP, ANA, CK, HP panel - full PFTs next available as baseline - in-office simple spirometry next visit in 6 months - continue trelegy 1 puff once daily, needs to start rinsing mouth after each use - start nystatin QID for 2 weeks - HRCT Chest in 6 months      Omar Person, MD [facility] 12/16/2021 1:00 PM

## 2021-12-16 NOTE — Patient Instructions (Signed)
Full PFT performed today. °

## 2021-12-17 ENCOUNTER — Ambulatory Visit: Payer: Medicare Other | Admitting: Student

## 2021-12-17 ENCOUNTER — Encounter: Payer: Self-pay | Admitting: Student

## 2021-12-17 VITALS — BP 140/68 | HR 70 | Temp 97.5°F | Ht 62.0 in | Wt 117.0 lb

## 2021-12-17 DIAGNOSIS — J849 Interstitial pulmonary disease, unspecified: Secondary | ICD-10-CM | POA: Diagnosis not present

## 2021-12-17 NOTE — Patient Instructions (Addendum)
-   would try to get flu shot and RSV shot this fall  - if doing any worse from breathing standpoint or if cough any worse happy to see you again in clinic to consider treating this interstitial lung disease (ILD - lung scarring process) sooner than next visit - see you in a year!

## 2021-12-23 ENCOUNTER — Ambulatory Visit (INDEPENDENT_AMBULATORY_CARE_PROVIDER_SITE_OTHER): Payer: Medicare Other | Admitting: Ophthalmology

## 2021-12-23 DIAGNOSIS — Z961 Presence of intraocular lens: Secondary | ICD-10-CM | POA: Diagnosis not present

## 2021-12-23 DIAGNOSIS — H35033 Hypertensive retinopathy, bilateral: Secondary | ICD-10-CM | POA: Diagnosis not present

## 2021-12-23 DIAGNOSIS — I10 Essential (primary) hypertension: Secondary | ICD-10-CM

## 2021-12-23 DIAGNOSIS — H4311 Vitreous hemorrhage, right eye: Secondary | ICD-10-CM

## 2021-12-24 ENCOUNTER — Encounter (INDEPENDENT_AMBULATORY_CARE_PROVIDER_SITE_OTHER): Payer: Self-pay | Admitting: Ophthalmology

## 2022-01-02 ENCOUNTER — Ambulatory Visit: Payer: Medicare Other | Admitting: Acute Care

## 2022-01-08 ENCOUNTER — Other Ambulatory Visit: Payer: Medicare Other

## 2022-01-19 ENCOUNTER — Encounter (INDEPENDENT_AMBULATORY_CARE_PROVIDER_SITE_OTHER): Payer: Medicare Other | Admitting: Ophthalmology

## 2022-01-29 ENCOUNTER — Other Ambulatory Visit (HOSPITAL_COMMUNITY): Payer: Self-pay

## 2022-02-14 ENCOUNTER — Other Ambulatory Visit: Payer: Self-pay

## 2022-02-14 ENCOUNTER — Emergency Department (HOSPITAL_COMMUNITY)
Admission: EM | Admit: 2022-02-14 | Discharge: 2022-02-15 | Disposition: A | Discharge status: Skilled Nursing Facility | Payer: Medicare Other | Attending: Emergency Medicine | Admitting: Emergency Medicine

## 2022-02-14 DIAGNOSIS — M542 Cervicalgia: Secondary | ICD-10-CM | POA: Diagnosis present

## 2022-02-14 DIAGNOSIS — Z7901 Long term (current) use of anticoagulants: Secondary | ICD-10-CM | POA: Insufficient documentation

## 2022-02-14 DIAGNOSIS — I5032 Chronic diastolic (congestive) heart failure: Secondary | ICD-10-CM | POA: Diagnosis not present

## 2022-02-14 DIAGNOSIS — M5481 Occipital neuralgia: Secondary | ICD-10-CM | POA: Diagnosis not present

## 2022-02-14 DIAGNOSIS — F0390 Unspecified dementia without behavioral disturbance: Secondary | ICD-10-CM | POA: Diagnosis not present

## 2022-02-14 DIAGNOSIS — E039 Hypothyroidism, unspecified: Secondary | ICD-10-CM | POA: Insufficient documentation

## 2022-02-14 LAB — CBC WITH DIFFERENTIAL/PLATELET
Abs Immature Granulocytes: 0.08 10*3/uL — ABNORMAL HIGH (ref 0.00–0.07)
Basophils Absolute: 0 10*3/uL (ref 0.0–0.1)
Basophils Relative: 0 %
Eosinophils Absolute: 0.1 10*3/uL (ref 0.0–0.5)
Eosinophils Relative: 1 %
HCT: 43.6 % (ref 36.0–46.0)
Hemoglobin: 15 g/dL (ref 12.0–15.0)
Immature Granulocytes: 1 %
Lymphocytes Relative: 34 %
Lymphs Abs: 3.2 10*3/uL (ref 0.7–4.0)
MCH: 33.2 pg (ref 26.0–34.0)
MCHC: 34.4 g/dL (ref 30.0–36.0)
MCV: 96.5 fL (ref 80.0–100.0)
Monocytes Absolute: 0.7 10*3/uL (ref 0.1–1.0)
Monocytes Relative: 8 %
Neutro Abs: 5.4 10*3/uL (ref 1.7–7.7)
Neutrophils Relative %: 56 %
Platelets: 217 10*3/uL (ref 150–400)
RBC: 4.52 MIL/uL (ref 3.87–5.11)
RDW: 12.9 % (ref 11.5–15.5)
WBC: 9.6 10*3/uL (ref 4.0–10.5)
nRBC: 0 % (ref 0.0–0.2)

## 2022-02-14 LAB — COMPREHENSIVE METABOLIC PANEL
ALT: 13 U/L (ref 0–44)
AST: 17 U/L (ref 15–41)
Albumin: 4.3 g/dL (ref 3.5–5.0)
Alkaline Phosphatase: 50 U/L (ref 38–126)
Anion gap: 11 (ref 5–15)
BUN: 11 mg/dL (ref 8–23)
CO2: 24 mmol/L (ref 22–32)
Calcium: 9.7 mg/dL (ref 8.9–10.3)
Chloride: 103 mmol/L (ref 98–111)
Creatinine, Ser: 0.95 mg/dL (ref 0.44–1.00)
GFR, Estimated: 59 mL/min — ABNORMAL LOW (ref >60)
Glucose, Bld: 102 mg/dL — ABNORMAL HIGH (ref 70–99)
Potassium: 4.1 mmol/L (ref 3.5–5.1)
Sodium: 138 mmol/L (ref 135–145)
Total Bilirubin: 0.9 mg/dL (ref 0.3–1.2)
Total Protein: 7.7 g/dL (ref 6.5–8.1)

## 2022-02-14 MED ORDER — OXYCODONE-ACETAMINOPHEN 5-325 MG PO TABS
1.0000 | ORAL_TABLET | Freq: Once | ORAL | Status: AC
  Administered 2022-02-14: 1 via ORAL
  Filled 2022-02-14: qty 1

## 2022-02-14 MED ORDER — LIDOCAINE-EPINEPHRINE 1 %-1:100000 IJ SOLN
20.0000 mL | Freq: Once | INTRAMUSCULAR | Status: AC
  Administered 2022-02-14: 20 mL via INTRADERMAL
  Filled 2022-02-14: qty 1

## 2022-02-14 NOTE — ED Provider Notes (Signed)
[facility] EMERGENCY DEPARTMENT Provider Note   CSN: [id number] Arrival date & time: 02/14/22  1638     History  Chief Complaint  Patient presents with   Neck Pain    Lindsay Rush is a 87 y.o. female.   Neck Pain  This patient is an 87 year old female, she is fairly healthy with some hypothyroidism she has no prior history of stroke or heart attack, but does have chronic diastolic heart failure.  She has some interstitial lung disease.  She has never had a stroke, she does have mild dementia.  She presents with a complaint of left-sided neck pain at the base of her skull.  It is a sharp and shooting electric-like sensation that comes on about twice a minute lasting several seconds and then resolving.  The pain sometimes radiates up into the scalp, it is not midline, it does not go to the right side and it does not involve any pain going into the shoulder or the arm, there is no numbness or weakness of the arm.  Symptoms are present intermittently throughout the exam  She feels like it might be worse when she moves her head from side to side but mostly worse when she tries to look to the side or put her chin on her chest.  It is not worse with looking up    Home Medications Prior to Admission medications   Medication Sig Start Date End Date Taking? Authorizing Provider  ACETAMINOPHEN EXTRA STRENGTH 500 MG tablet Take 500 mg by mouth 3 (three) times daily. 07/28/21   [provider]  diclofenac Sodium (VOLTAREN) 1 % GEL Apply 4 g topically 4 (four) times daily. 10/29/21   Melene Plan, DO  digoxin (LANOXIN) 0.125 MG tablet Take 1 tablet (0.125 mg total) by mouth daily. 04/08/21 12/17/21  Darlin Drop, DO  escitalopram (LEXAPRO) 5 MG tablet Take 5 mg by mouth daily.    [provider]  ezetimibe (ZETIA) 10 MG tablet Take 10 mg by mouth at bedtime.    [provider]  Fluticasone-Umeclidin-Vilant (TRELEGY ELLIPTA) 200-62.5-25 MCG/ACT AEPB  Inhale 1 puff into the lungs daily.    [provider]  furosemide (LASIX) 40 MG tablet Take 1 tablet (40 mg total) by mouth daily. 04/07/21 12/17/21  Darlin Drop, DO  polyethylene glycol powder (GLYCOLAX/MIRALAX) 17 GM/SCOOP powder Take 17 g by mouth daily.    [provider]  potassium chloride (KLOR-CON) 20 MEQ packet Take 20 mEq by mouth daily. 04/08/21 12/17/21  Darlin Drop, DO  Rivaroxaban (XARELTO) 15 MG TABS tablet Take 15 mg by mouth daily.    [provider]  thyroid (ARMOUR) 30 MG tablet Take 30 mg by mouth daily before breakfast.    [provider]  traZODone (DESYREL) 50 MG tablet Take 50 mg by mouth at bedtime.    [provider]      Allergies    Codeine    Review of Systems   Review of Systems  Musculoskeletal:  Positive for neck pain.  All other systems reviewed and are negative.   Physical Exam Updated Vital Signs BP (!) 165/118 (BP Location: Left Arm)   Pulse (!) 104   Temp 98.3 F (36.8 C) (Oral)   Resp 15   SpO2 95%  Physical Exam Vitals and nursing note reviewed.  Constitutional:      General: She is not in acute distress.    Appearance: She is well-developed.  HENT:  Head: Normocephalic and atraumatic.     Mouth/Throat:     Pharynx: No oropharyngeal exudate.  Eyes:     General: No scleral icterus.       Right eye: No discharge.        Left eye: No discharge.     Conjunctiva/sclera: Conjunctivae normal.     Pupils: Pupils are equal, round, and reactive to light.  Neck:     Thyroid: No thyromegaly.     Vascular: No JVD.     Comments: No thyromegaly, no lymphadenopathy, the patient has a very supple neck passively from side to side. Cardiovascular:     Rate and Rhythm: Normal rate and regular rhythm.     Heart sounds: Normal heart sounds. No murmur heard.    No friction rub. No gallop.  Pulmonary:     Effort: Pulmonary effort is normal. No respiratory distress.     Breath sounds: Normal breath  sounds. No wheezing or rales.  Abdominal:     General: Bowel sounds are normal. There is no distension.     Palpations: Abdomen is soft. There is no mass.     Tenderness: There is no abdominal tenderness.  Musculoskeletal:        General: Tenderness present. Normal range of motion.     Cervical back: Normal range of motion and neck supple.     Comments: The patient has some tenderness at the site of the occipital nerve exit on the left side, there is no changes to the skin in this area, no swelling, no redness, no asymmetry  Lymphadenopathy:     Cervical: No cervical adenopathy.  Skin:    General: Skin is warm and dry.     Findings: No erythema or rash.  Neurological:     Mental Status: She is alert.     Coordination: Coordination normal.     Comments: Normal strength and sensation in all 4 extremities, normal finger-nose-finger, normal speech, normal coordination, normal visual fields bilaterally, normal peripheral visual fields, normal cranial nerves III through XII, normal speech  Psychiatric:        Behavior: Behavior normal.     ED Results / Procedures / Treatments   Labs (all labs ordered are listed, but only abnormal results are displayed) Labs Reviewed  COMPREHENSIVE METABOLIC PANEL - Abnormal; Notable for the following components:      Result Value   Glucose, Bld 102 (*)    GFR, Estimated 59 (*)    All other components within normal limits  CBC WITH DIFFERENTIAL/PLATELET - Abnormal; Notable for the following components:   Abs Immature Granulocytes 0.08 (*)    All other components within normal limits    EKG None  Radiology No results found.  Procedures Procedures    Medications Ordered in ED Medications  lidocaine-EPINEPHrine (XYLOCAINE W/EPI) 1 %-1:100000 (with pres) injection 20 mL (20 mLs Intradermal Given 02/14/22 2251)    ED Course/ Medical Decision Making/ A&P                           Medical Decision Making Amount and/or Complexity of Data  Reviewed Labs: ordered.  Risk Prescription drug management.   I suspect this patient has occipital neuralgia, her vital signs do reflect some hypertension but her laboratory work-up was unremarkable.  She has no chest pain or shortness of breath, she is not fluid overloaded and has no arrhythmia.  We will perform an occipital nerve block with lidocaine, the  patient is agreeable the risk benefits and alternatives were explained.  Procedure note: Occipital nerve block  After risk benefits and alternatives were given to the patient she requested to proceed with the procedure.  She was placed in the sitting position, the occipital nerve area was isolated, cleansed with ChloraPrep, allowed to dry and 1 cc of lidocaine with 1% with epinephrine was injected into that area.  There was no bleeding, there was good relief of the pain after the procedure.  The patient tolerated with no difficulty.        Final Clinical Impression(s) / ED Diagnoses Final diagnoses:  Occipital neuralgia of left side      Eber Hong, MD 02/14/22 2252

## 2022-02-14 NOTE — ED Triage Notes (Addendum)
Patient from [facility]. Patient woke up this AM at 0100 complaining of neck pain intermittently. Describes it as a sharp shooting pain to the L side. Denies visual deficits. Denies chest pain, SHOB, n/v/d. No trauma to explain the pain. No falls.   VSS per EMS 160/100 BP 78 HR  98 % RA  CBG 101.

## 2022-02-14 NOTE — Discharge Instructions (Signed)
We have done an occipital nerve block which may help with the pain.  If you are continuing to have this pain your family doctor will need to reexamine and consider prescribing a nerve medication.  The problem with these medications is that they often come with side effects which we are trying to avoid.  You should start with Tylenol or ibuprofen however if this is not enough then you may need to have your family doctor consider other medications for nerve pain.  Please return to the emergency department for severe worsening symptoms including weakness numbness changes in vision or other severe worsening symptoms.

## 2022-02-15 NOTE — ED Notes (Signed)
Pt pacing hallway asking when she will be leaving. Pt informed that transportation has been called.

## 2022-02-15 NOTE — ED Notes (Signed)
Pt now back in bed, delay in transportation explained to pt.

## 2022-03-17 NOTE — Progress Notes (Shared)
[facility] Note  03/18/2022     CHIEF COMPLAINT Patient presents for No chief complaint on file.   HISTORY OF PRESENT ILLNESS: Lindsay Rush is a 87 y.o. female who presents to the clinic today for:     Referring physician: Roetta Sessions, NP [place],  [place] [address]  HISTORICAL INFORMATION:   Selected notes from the MEDICAL RECORD NUMBER Referred by Dr. Valetta Close for vitreous heme OD LEE:  Ocular Hx- PMH-    CURRENT MEDICATIONS: No current outpatient medications on file. (Ophthalmic Drugs)   No current facility-administered medications for this visit. (Ophthalmic Drugs)   Current Outpatient Medications (Other)  Medication Sig   ACETAMINOPHEN EXTRA STRENGTH 500 MG tablet Take 500 mg by mouth 3 (three) times daily.   diclofenac Sodium (VOLTAREN) 1 % GEL Apply 4 g topically 4 (four) times daily.   digoxin (LANOXIN) 0.125 MG tablet Take 1 tablet (0.125 mg total) by mouth daily.   escitalopram (LEXAPRO) 5 MG tablet Take 5 mg by mouth daily.   ezetimibe (ZETIA) 10 MG tablet Take 10 mg by mouth at bedtime.   Fluticasone-Umeclidin-Vilant (TRELEGY ELLIPTA) 200-62.5-25 MCG/ACT AEPB Inhale 1 puff into the lungs daily.   furosemide (LASIX) 40 MG tablet Take 1 tablet (40 mg total) by mouth daily.   polyethylene glycol powder (GLYCOLAX/MIRALAX) 17 GM/SCOOP powder Take 17 g by mouth daily.   potassium chloride (KLOR-CON) 20 MEQ packet Take 20 mEq by mouth daily.   Rivaroxaban (XARELTO) 15 MG TABS tablet Take 15 mg by mouth daily.   thyroid (ARMOUR) 30 MG tablet Take 30 mg by mouth daily before breakfast.   traZODone (DESYREL) 50 MG tablet Take 50 mg by mouth at bedtime.   No current facility-administered medications for this visit. (Other)   REVIEW OF SYSTEMS:   ALLERGIES Allergies  Allergen Reactions   Codeine    PAST MEDICAL HISTORY Past Medical History:  Diagnosis Date   Atrial fibrillation (HCC)     Chronic diastolic CHF (congestive heart failure) (HCC)    Chronic hypotension    COPD (chronic obstructive pulmonary disease) (HCC)    Dementia (HCC)    Fall    Hyperlipidemia    Hypertension    Hypothyroidism    Insomnia    MCI (mild cognitive impairment)    Thyrotoxicosis    Past Surgical History:  Procedure Laterality Date   ABDOMINAL HYSTERECTOMY     APPENDECTOMY     CHOLECYSTECTOMY     left knee replacement  12/2020   FAMILY HISTORY Family History  Problem Relation Age of Onset   Heart attack Father    SOCIAL HISTORY Social History   Tobacco Use   Smoking status: Never   Smokeless tobacco: Never  Vaping Use   Vaping Use: Every day  Substance Use Topics   Alcohol use: Never   Drug use: Never    Comment: unable to answer       OPHTHALMIC EXAM:  Not recorded    IMAGING AND PROCEDURES  Imaging and Procedures for 03/18/2022          ASSESSMENT/PLAN:  No diagnosis found.  Vitreous hemorrhage OD - pt and daughter report several month history of floaters in vision OD - pt with dementia and mild cognitive impairment -- poor historian - moderate blood stained vitreous condensations w/ some old white VH settled inferiorly - today -- pt reports difficulty with sleeping with head elevated; exam shows mild interval improvement  in [facility] - unclear etiology -- no history of DM, but on Xarelto for Afib and history of multiple falls over the last several months--most recently in June 2023 w/ normal head CT - suspect hemorrhagic PVD vs trauma + blood thinner or combination - discussed findings, thoughts, and treatment options - VH precautions reviewed -- minimize activities, keep head elevated, avoid ASA/NSAIDs/blood thinners as able - f/u 2 weeks, DFE, OCT  2,3. Hypertensive retinopathy OU - discussed importance of tight BP control - monitor   4. Pseudophakia OU  - s/p CE/IOL OU ([place])  - IOL in good position, doing well  - monitor  Ophthalmic Meds Ordered  this visit:  No orders of the defined types were placed in this encounter.    No follow-ups on file.  There are no Patient Instructions on file for this visit.  Explained the diagnoses, plan, and follow up with the patient and they expressed understanding.  Patient expressed understanding of the importance of proper follow up care.   This document serves as a record of services personally performed by Gardiner Sleeper, MD, PhD. It was created on their behalf by Orvan Falconer, an ophthalmic technician. The creation of this record is the provider's dictation and/or activities during the visit.    Electronically signed by: Orvan Falconer, OA, 03/17/22  12:40 PM   Gardiner Sleeper, M.D., Ph.D. Diseases & Surgery of the Retina and Vitreous [facility]: M myopia (nearsighted); A astigmatism; H hyperopia (farsighted); P presbyopia; Mrx spectacle prescription;  CTL contact lenses; OD right eye; OS left eye; OU both eyes  XT exotropia; ET esotropia; PEK punctate epithelial keratitis; PEE punctate epithelial erosions; DES dry eye syndrome; MGD meibomian gland dysfunction; ATs artificial tears; PFAT's preservative free artificial tears; [place] nuclear sclerotic cataract; PSC posterior subcapsular cataract; ERM epi-retinal membrane; PVD posterior vitreous detachment; RD retinal detachment; DM diabetes mellitus; DR diabetic retinopathy; NPDR non-proliferative diabetic retinopathy; PDR proliferative diabetic retinopathy; CSME clinically significant macular edema; DME diabetic macular edema; dbh dot blot hemorrhages; CWS cotton wool spot; POAG primary open angle glaucoma; C/D cup-to-disc ratio; HVF humphrey visual field; GVF goldmann visual field; OCT optical coherence tomography; IOP intraocular pressure; BRVO Branch retinal vein occlusion; CRVO central retinal vein occlusion; CRAO central retinal artery occlusion; BRAO branch retinal artery occlusion; RT retinal tear;  SB scleral buckle; PPV pars plana vitrectomy; VH Vitreous hemorrhage; PRP panretinal laser photocoagulation; IVK intravitreal kenalog; VMT vitreomacular traction; MH Macular hole;  NVD neovascularization of the disc; NVE neovascularization elsewhere; AREDS age related eye disease study; ARMD age related macular degeneration; POAG primary open angle glaucoma; EBMD epithelial/anterior basement membrane dystrophy; ACIOL anterior chamber intraocular lens; IOL intraocular lens; PCIOL posterior chamber intraocular lens; Phaco/IOL phacoemulsification with intraocular lens placement; [place] photorefractive keratectomy; LASIK laser assisted in situ keratomileusis; HTN hypertension; DM diabetes mellitus; COPD chronic obstructive pulmonary disease

## 2022-03-18 ENCOUNTER — Encounter (INDEPENDENT_AMBULATORY_CARE_PROVIDER_SITE_OTHER): Payer: Medicare Other | Admitting: Ophthalmology

## 2022-03-18 DIAGNOSIS — I10 Essential (primary) hypertension: Secondary | ICD-10-CM

## 2022-03-18 DIAGNOSIS — H4311 Vitreous hemorrhage, right eye: Secondary | ICD-10-CM

## 2022-03-18 DIAGNOSIS — H35033 Hypertensive retinopathy, bilateral: Secondary | ICD-10-CM

## 2022-03-18 DIAGNOSIS — Z961 Presence of intraocular lens: Secondary | ICD-10-CM

## 2022-03-24 NOTE — Progress Notes (Signed)
[facility] Note  03/25/2022     CHIEF COMPLAINT Patient presents for Retina Follow Up   HISTORY OF PRESENT ILLNESS: Lindsay Rush is a 87 y.o. female who presents to the clinic today for:   HPI     Retina Follow Up   Patient presents with  Other.  In right eye.  Severity is moderate.  Duration of 13 weeks.  Since onset it is gradually worsening.  I, the attending physician,  performed the HPI with the patient and updated documentation appropriately.        Comments   Pt here for 13 wk ret f/u for VH OD. Pt delayed from 2wk f/u. Pt states VA in OD is not good, still the same from before. No change reported OS. Pt still on Xarelto.       Last edited by Bernarda Caffey, MD on 03/25/2022  5:22 PM.    Pt is delayed to follow up from 2 weeks to 13 weeks, pt states the floaters have not improved at all, she is sleeping with her head on 2 pillows  Referring physician: Roetta Sessions, NP [place],  [place] [id number]  HISTORICAL INFORMATION:   Selected notes from the MEDICAL RECORD NUMBER Referred by Dr. Valetta Close for vitreous heme OD LEE:  Ocular Hx- PMH-    CURRENT MEDICATIONS: No current outpatient medications on file. (Ophthalmic Drugs)   No current facility-administered medications for this visit. (Ophthalmic Drugs)   Current Outpatient Medications (Other)  Medication Sig   ACETAMINOPHEN EXTRA STRENGTH 500 MG tablet Take 500 mg by mouth 3 (three) times daily.   diclofenac Sodium (VOLTAREN) 1 % GEL Apply 4 g topically 4 (four) times daily.   escitalopram (LEXAPRO) 5 MG tablet Take 5 mg by mouth daily.   ezetimibe (ZETIA) 10 MG tablet Take 10 mg by mouth at bedtime.   Fluticasone-Umeclidin-Vilant (TRELEGY ELLIPTA) 200-62.5-25 MCG/ACT AEPB Inhale 1 puff into the lungs daily.   polyethylene glycol powder (GLYCOLAX/MIRALAX) 17 GM/SCOOP powder Take 17 g by mouth daily.   Rivaroxaban (XARELTO) 15 MG TABS tablet Take  15 mg by mouth daily.   thyroid (ARMOUR) 30 MG tablet Take 30 mg by mouth daily before breakfast.   traZODone (DESYREL) 50 MG tablet Take 50 mg by mouth at bedtime.   digoxin (LANOXIN) 0.125 MG tablet Take 1 tablet (0.125 mg total) by mouth daily.   furosemide (LASIX) 40 MG tablet Take 1 tablet (40 mg total) by mouth daily.   potassium chloride (KLOR-CON) 20 MEQ packet Take 20 mEq by mouth daily.   No current facility-administered medications for this visit. (Other)   REVIEW OF SYSTEMS: ROS   Positive for: Eyes Negative for: Constitutional, Gastrointestinal, Neurological, Skin, Genitourinary, Musculoskeletal, HENT, Endocrine, Cardiovascular, Respiratory, Psychiatric, Allergic/Imm, Heme/Lymph Last edited by Kingsley Spittle, COT on 03/25/2022  1:55 PM.      ALLERGIES Allergies  Allergen Reactions   Codeine    PAST MEDICAL HISTORY Past Medical History:  Diagnosis Date   Atrial fibrillation (HCC)    Chronic diastolic CHF (congestive heart failure) (HCC)    Chronic hypotension    COPD (chronic obstructive pulmonary disease) ([place])    Dementia ([place])    Fall    Hyperlipidemia    Hypertension    Hypothyroidism    Insomnia    MCI (mild cognitive impairment)    Thyrotoxicosis    Past Surgical History:  Procedure Laterality Date   ABDOMINAL HYSTERECTOMY  APPENDECTOMY     CHOLECYSTECTOMY     left knee replacement  12/2020   FAMILY HISTORY Family History  Problem Relation Age of Onset   Heart attack Father    SOCIAL HISTORY Social History   Tobacco Use   Smoking status: Never   Smokeless tobacco: Never  Vaping Use   Vaping Use: Every day  Substance Use Topics   Alcohol use: Never   Drug use: Never    Comment: unable to answer       OPHTHALMIC EXAM:  Base Eye Exam     Visual Acuity (Snellen - Linear)       Right Left   Dist [place] 20/40 20/30   Dist ph [place] NI 20/25         Tonometry (Tonopen, 2:02 PM)       Right Left   Pressure 19 18          Pupils       Dark Light Shape React APD   Right 3 2 Round Brisk None   Left 3 2 Round Brisk None         Visual Fields (Counting fingers)       Left Right    Full Full         Extraocular Movement       Right Left    Full, Ortho Full, Ortho         Neuro/Psych     Oriented x3: Yes   Mood/Affect: Normal         Dilation     Both eyes: 1.0% Mydriacyl, 2.5% Phenylephrine @ 2:03 PM           Slit Lamp and Fundus Exam     Slit Lamp Exam       Right Left   Lids/Lashes Dermatochalasis - upper lid Dermatochalasis - upper lid, Meibomian gland dysfunction   Conjunctiva/Sclera White and quiet White and quiet   Cornea well healed cataract wound, ear film debris well healed cataract wound, tear film debris, trace Punctate epithelial erosions   Anterior Chamber deep, clear, narrow temporal angle deep and clear   Iris Round and dilated Round and moderately dilated   Lens PC IOL in good position with open PC 3 piece PC IOL in good position, 1+PCO   Anterior Vitreous Vitreous syneresis, +RBC's, blood stained vitreous condensations clearing, settling inferiorly and turning white, blood clots settled inferiorly turning white Vitreous syneresis, Posterior vitreous detachment         Fundus Exam       Right Left   Disc mild Pallor, Sharp rim mild Pallor, Sharp rim, Compact   C/D Ratio 0.4 0.2   Macula Flat, Blunted foveal reflex, No heme or edema Flat, Blunted foveal reflex, fine drusen, RPE mottling and clumping, No heme or edema   Vessels attenuated, mild tortuosity attenuated, Tortuous   Periphery Attached; inferior retina obscured by VH; pigmented CR scar at 1100 -- ?old tear Attached, mild reticular degeneration           IMAGING AND PROCEDURES  Imaging and Procedures for 03/25/2022  OCT, Retina - OU - Both Eyes       Right Eye Quality was good. Central Foveal Thickness: 274. Progression has improved. Findings include normal foveal contour, no IRF, no  SRF (Trace ERM, interval improvement in vitreous opacities -- settling inferiorly).   Left Eye Quality was good. Central Foveal Thickness: 273. Progression has been stable. Findings include normal foveal contour, no IRF, no  SRF.   Notes *Images captured and stored on drive  Diagnosis / Impression:  NFP, no IRF / SRF OU OD: Trace ERM, interval improvement in vitreous opacities -- settling inferiorly  Clinical management:  See below  Abbreviations: NFP - Normal foveal profile. CME - cystoid macular edema. PED - pigment epithelial detachment. IRF - intraretinal fluid. SRF - subretinal fluid. EZ - ellipsoid zone. ERM - epiretinal membrane. ORA - outer retinal atrophy. ORT - outer retinal tubulation. SRHM - subretinal hyper-reflective material. IRHM - intraretinal hyper-reflective material             ASSESSMENT/PLAN:    ICD-10-CM   1. Vitreous hemorrhage of right eye (HCC)  H43.11 OCT, Retina - OU - Both Eyes    2. Pseudophakia, both eyes  Z96.1     3. Essential hypertension  I10     4. Hypertensive retinopathy of both eyes  H35.033       Vitreous hemorrhage OD  - pt delayed to f/u from 2 wks to 13 wks - pt and daughter report several month history of floaters in vision OD - pt with dementia and mild cognitive impairment -- poor historian - moderate blood stained vitreous condensations w/ some old white VH settled inferiorly -- improving - today -- pt reports sleeping with head elevated on 2 pillows; exam shows mild interval improvement in [facility] - unclear etiology -- no history of DM, but on Xarelto for Afib and history of multiple falls over the last several months--most recently in June 2023 w/ normal head CT - suspect hemorrhagic PVD vs trauma + blood thinner or combination - discussed findings, thoughts, and treatment options - VH precautions reviewed -- minimize activities, keep head elevated, avoid ASA/NSAIDs/blood thinners as able - f/u 3 months, sooner prn -- DFE,  OCT  2,3. Hypertensive retinopathy OU - discussed importance of tight BP control - monitor   4. Pseudophakia OU  - s/p CE/IOL OU ([place])  - IOL in good position, doing well  - monitor  Ophthalmic Meds Ordered this visit:  No orders of the defined types were placed in this encounter.    Return in about 3 months (around 06/25/2022) for f/u VH OD, DFE, OCT.  There are no Patient Instructions on file for this visit.  Explained the diagnoses, plan, and follow up with the patient and they expressed understanding.  Patient expressed understanding of the importance of proper follow up care.   This document serves as a record of services personally performed by Gardiner Sleeper, MD, PhD. It was created on their behalf by Orvan Falconer, an ophthalmic technician. The creation of this record is the provider's dictation and/or activities during the visit.    Electronically signed by: Orvan Falconer, OA, 03/25/22  5:22 PM  This document serves as a record of services personally performed by Gardiner Sleeper, MD, PhD. It was created on their behalf by San Jetty. Owens Shark, OA an ophthalmic technician. The creation of this record is the provider's dictation and/or activities during the visit.    Electronically signed by: San Jetty. [place], [place] 11.15.2023 5:22 PM  Gardiner Sleeper, M.D., Ph.D. Diseases & Surgery of the Retina and Vitreous [facility]  I have reviewed the above documentation for accuracy and completeness, and I agree with the above. Gardiner Sleeper, M.D., Ph.D. 03/25/22 5:24 PM   Abbreviations: M myopia (nearsighted); A astigmatism; H hyperopia (farsighted); P presbyopia; Mrx spectacle prescription;  CTL contact lenses; OD right eye;  OS left eye; OU both eyes  XT exotropia; ET esotropia; PEK punctate epithelial keratitis; PEE punctate epithelial erosions; DES dry eye syndrome; MGD meibomian gland dysfunction; ATs artificial tears; PFAT's preservative free  artificial tears; [place] nuclear sclerotic cataract; PSC posterior subcapsular cataract; ERM epi-retinal membrane; PVD posterior vitreous detachment; RD retinal detachment; DM diabetes mellitus; DR diabetic retinopathy; NPDR non-proliferative diabetic retinopathy; PDR proliferative diabetic retinopathy; CSME clinically significant macular edema; DME diabetic macular edema; dbh dot blot hemorrhages; CWS cotton wool spot; POAG primary open angle glaucoma; C/D cup-to-disc ratio; HVF humphrey visual field; GVF goldmann visual field; OCT optical coherence tomography; IOP intraocular pressure; BRVO Branch retinal vein occlusion; CRVO central retinal vein occlusion; CRAO central retinal artery occlusion; BRAO branch retinal artery occlusion; RT retinal tear; SB scleral buckle; PPV pars plana vitrectomy; VH Vitreous hemorrhage; PRP panretinal laser photocoagulation; IVK intravitreal kenalog; VMT vitreomacular traction; MH Macular hole;  NVD neovascularization of the disc; NVE neovascularization elsewhere; AREDS age related eye disease study; ARMD age related macular degeneration; POAG primary open angle glaucoma; EBMD epithelial/anterior basement membrane dystrophy; ACIOL anterior chamber intraocular lens; IOL intraocular lens; PCIOL posterior chamber intraocular lens; Phaco/IOL phacoemulsification with intraocular lens placement; [place] photorefractive keratectomy; LASIK laser assisted in situ keratomileusis; HTN hypertension; DM diabetes mellitus; COPD chronic obstructive pulmonary disease

## 2022-03-25 ENCOUNTER — Ambulatory Visit (INDEPENDENT_AMBULATORY_CARE_PROVIDER_SITE_OTHER): Payer: Medicare Other | Admitting: Ophthalmology

## 2022-03-25 ENCOUNTER — Encounter (INDEPENDENT_AMBULATORY_CARE_PROVIDER_SITE_OTHER): Payer: Self-pay | Admitting: Ophthalmology

## 2022-03-25 DIAGNOSIS — H35033 Hypertensive retinopathy, bilateral: Secondary | ICD-10-CM | POA: Diagnosis not present

## 2022-03-25 DIAGNOSIS — H4311 Vitreous hemorrhage, right eye: Secondary | ICD-10-CM | POA: Diagnosis not present

## 2022-03-25 DIAGNOSIS — I10 Essential (primary) hypertension: Secondary | ICD-10-CM

## 2022-03-25 DIAGNOSIS — Z961 Presence of intraocular lens: Secondary | ICD-10-CM

## 2022-06-11 ENCOUNTER — Ambulatory Visit: Payer: Medicare Other | Admitting: Psychiatry

## 2022-06-11 ENCOUNTER — Encounter: Payer: Self-pay | Admitting: Psychiatry

## 2022-06-11 VITALS — BP 149/91 | HR 75 | Ht 62.0 in | Wt 117.6 lb

## 2022-06-11 DIAGNOSIS — G3184 Mild cognitive impairment, so stated: Secondary | ICD-10-CM

## 2022-06-11 NOTE — Patient Instructions (Signed)
Start B12 1000 mcg daily. You can pick this up over the counter

## 2022-06-11 NOTE — Progress Notes (Signed)
   CC:  memory loss  Follow-up Visit  Last visit: 06/11/21  Brief HPI: 88 year old female with a history of COPD, afib, HLD, hypothyroidism, depression/anxiety who follows in clinic for memory loss. [facility] 04/05/22 showed chronic encephalomalacia of the left frontal lobe, mild generalized atrophy, and mild chronic small vessel ischemic change.  Interval History: B12 level was 243 at her last visit and she was recommended to start B12 supplementation. She has not yet started this.  Memory has been stable since her last visit. She personally does not notice any issues with her memory. States her facility takes care of cooking, cleaning, and medications so she does not need to remember much during the day. Daughter feels she has some occasional mild forgetfulness, but nothing particularly concerning  Physical Exam:   Vital Signs: BP (!) 149/91 (BP Location: Left Arm, Patient Position: Sitting, Cuff Size: Normal)   Pulse 75   Ht 5\' 2"  (1.575 m)   Wt 117 lb 9.6 oz (53.3 kg)   BMI 21.51 kg/m  GENERAL:  well appearing, in no acute distress, alert  SKIN:  Color, texture, turgor normal. No rashes or lesions HEAD:  Normocephalic/atraumatic. RESP: normal respiratory effort MSK:  No gross joint deformities.   NEUROLOGICAL: Mental Status:     06/11/2022    3:15 PM  Montreal Cognitive Assessment   Visuospatial/ Executive (0/5) 5  Naming (0/3) 3  Attention: Read list of digits (0/2) 2  Attention: Read list of letters (0/1) 1  Attention: Serial 7 subtraction starting at 100 (0/3) 1  Language: Repeat phrase (0/2) 2  Language : Fluency (0/1) 1  Abstraction (0/2) 2  Delayed Recall (0/5) 0  Orientation (0/6) 6  Total 23   Cranial Nerves: PERRL, face symmetric, no dysarthria, hearing grossly intact Motor: moves all extremities equally Gait: normal-based.  IMPRESSION: 88 year old female with a history of COPD, afib, HLD, hypothyroidism, depression/anxiety who presents for follow up of memory  loss. MOCA score today is 23, in the range of mild cognitive impairment. Per patient and family her memory has been relatively stable over the past year. Discussed how low B12 can affect memory and encouraged her to start B12 supplementation.  PLAN: -Start B12 1000 mcg daily -Repeat memory testing and B12 levels in 6-12 months  Follow-up: 6-12 months  I spent a total of 30 minutes on the date of the service. Discussed medication side effects, adverse reactions and drug interactions. Written educational materials and patient instructions outlining all of the above were given.  Genia Harold, MD 06/11/22 4:14 PM

## 2022-06-18 NOTE — Progress Notes (Signed)
[facility] Note  06/24/2022     CHIEF COMPLAINT Patient presents for Retina Follow Up   HISTORY OF PRESENT ILLNESS: Lindsay Rush is a 88 y.o. female who presents to the clinic today for:   HPI     Retina Follow Up   Patient presents with  Other.  In right eye.  This started 3 months ago.  I, the attending physician,  performed the HPI with the patient and updated documentation appropriately.        Comments   Patient here for 3 months retina follow up for VH OD. Patient states vision doing ok. No eye pain.       Last edited by Bernarda Caffey, MD on 06/24/2022  4:15 PM.    Pt states she can still see the floaters in her right eye, but she has learned how to ignore them  Referring physician: Roetta Sessions, NP [address],  [place] [address]  HISTORICAL INFORMATION:   Selected notes from the MEDICAL RECORD NUMBER Referred by Dr. Valetta Close for vitreous heme OD LEE:  Ocular Hx- PMH-    CURRENT MEDICATIONS: No current outpatient medications on file. (Ophthalmic Drugs)   No current facility-administered medications for this visit. (Ophthalmic Drugs)   Current Outpatient Medications (Other)  Medication Sig   ACETAMINOPHEN EXTRA STRENGTH 500 MG tablet Take 500 mg by mouth 3 (three) times daily.   diclofenac Sodium (VOLTAREN) 1 % GEL Apply 4 g topically 4 (four) times daily.   escitalopram (LEXAPRO) 5 MG tablet Take 5 mg by mouth daily.   ezetimibe (ZETIA) 10 MG tablet Take 10 mg by mouth at bedtime.   Fluticasone-Umeclidin-Vilant (TRELEGY ELLIPTA) 200-62.5-25 MCG/ACT AEPB Inhale 1 puff into the lungs daily.   polyethylene glycol powder (GLYCOLAX/MIRALAX) 17 GM/SCOOP powder Take 17 g by mouth daily.   Rivaroxaban (XARELTO) 15 MG TABS tablet Take 15 mg by mouth daily.   thyroid (ARMOUR) 30 MG tablet Take 30 mg by mouth daily before breakfast.   traZODone (DESYREL) 50 MG tablet Take 50 mg by mouth at bedtime.    digoxin (LANOXIN) 0.125 MG tablet Take 1 tablet (0.125 mg total) by mouth daily.   furosemide (LASIX) 40 MG tablet Take 1 tablet (40 mg total) by mouth daily.   potassium chloride (KLOR-CON) 20 MEQ packet Take 20 mEq by mouth daily.   No current facility-administered medications for this visit. (Other)   REVIEW OF SYSTEMS: ROS   Positive for: Eyes Negative for: Constitutional, Gastrointestinal, Neurological, Skin, Genitourinary, Musculoskeletal, HENT, Endocrine, Cardiovascular, Respiratory, Psychiatric, Allergic/Imm, Heme/Lymph Last edited by Theodore Demark, COA on 06/24/2022  1:28 PM.     ALLERGIES Allergies  Allergen Reactions   Codeine    PAST MEDICAL HISTORY Past Medical History:  Diagnosis Date   Atrial fibrillation (HCC)    Chronic diastolic CHF (congestive heart failure) (HCC)    Chronic hypotension    COPD (chronic obstructive pulmonary disease) (HCC)    Dementia ([place])    Fall    Hyperlipidemia    Hypertension    Hypothyroidism    Insomnia    MCI (mild cognitive impairment)    Thyrotoxicosis    Past Surgical History:  Procedure Laterality Date   ABDOMINAL HYSTERECTOMY     APPENDECTOMY     CHOLECYSTECTOMY     left knee replacement  12/2020   FAMILY HISTORY Family History  Problem Relation Age of Onset   Heart attack Father  SOCIAL HISTORY Social History   Tobacco Use   Smoking status: Never   Smokeless tobacco: Never  Vaping Use   Vaping Use: Every day  Substance Use Topics   Alcohol use: Never   Drug use: Never    Comment: unable to answer       OPHTHALMIC EXAM:  Base Eye Exam     Visual Acuity (Snellen - Linear)       Right Left   Dist cc 20/30 +2 20/20 -2   Dist ph cc NI     Correction: Glasses         Tonometry (Tonopen, 1:26 PM)       Right Left   Pressure 19 13         Neuro/Psych     Oriented x3: Yes   Mood/Affect: Normal         Dilation     Both eyes: 1.0% Mydriacyl, 2.5% Phenylephrine @ 1:26 PM            Slit Lamp and Fundus Exam     Slit Lamp Exam       Right Left   Lids/Lashes Dermatochalasis - upper lid Dermatochalasis - upper lid, Meibomian gland dysfunction   Conjunctiva/Sclera White and quiet White and quiet   Cornea well healed cataract wound, tear film debris, 1+ Punctate epithelial erosions well healed cataract wound, tear film debris, 2+ inferior Punctate epithelial erosions   Anterior Chamber deep, clear, narrow temporal angle deep and clear   Iris Round and dilated Round and moderately dilated   Lens PC IOL in good position with open PC 3 piece PC IOL in good position, 1+PCO   Anterior Vitreous Vitreous syneresis, +RBC's, blood stained vitreous condensations clearing, settling inferiorly and turning white, blood clots settled inferiorly turning white -- slightly improved Vitreous syneresis, Posterior vitreous detachment, vitreous condensations         Fundus Exam       Right Left   Disc mild Pallor, Sharp rim mild Pallor, Sharp rim, Compact   C/D Ratio 0.4 0.2   Macula Flat, Blunted foveal reflex, RPE mottling, No heme or edema Flat, Blunted foveal reflex, fine drusen, RPE mottling and clumping, No heme or edema   Vessels attenuated, mild tortuosity, mild AV crossing changes mild attenuation, mild tortuosity   Periphery Attached; inferior retina obscured by VH; pigmented CR scar at 1100 -- ?old tear Attached, mild reticular degeneration           Refraction     Wearing Rx       Sphere Cylinder Axis Add   Right -1.75 +1.00 008 +0.00   Left -0.75 +1.00 150 +0.00           IMAGING AND PROCEDURES  Imaging and Procedures for 06/24/2022  OCT, Retina - OU - Both Eyes       Right Eye Quality was good. Central Foveal Thickness: 269. Progression has improved. Findings include normal foveal contour, no IRF, no SRF (Trace ERM, interval improvement in vitreous opacities ).   Left Eye Quality was good. Central Foveal Thickness: 269. Progression has been  stable. Findings include normal foveal contour, no IRF, no SRF.   Notes *Images captured and stored on drive  Diagnosis / Impression:  NFP, no IRF / SRF OU OD: Trace ERM, interval improvement in vitreous opacities   Clinical management:  See below  Abbreviations: NFP - Normal foveal profile. CME - cystoid macular edema. PED - pigment epithelial detachment. IRF - intraretinal fluid.  SRF - subretinal fluid. EZ - ellipsoid zone. ERM - epiretinal membrane. ORA - outer retinal atrophy. ORT - outer retinal tubulation. SRHM - subretinal hyper-reflective material. IRHM - intraretinal hyper-reflective material            ASSESSMENT/PLAN:    ICD-10-CM   1. Vitreous hemorrhage of right eye (HCC)  H43.11 OCT, Retina - OU - Both Eyes    2. Essential hypertension  I10     3. Hypertensive retinopathy of both eyes  H35.033 OCT, Retina - OU - Both Eyes    4. Pseudophakia, both eyes  Z96.1      Vitreous hemorrhage OD - pt and daughter report several month history of floaters in vision OD - pt with dementia and mild cognitive impairment -- poor historian - moderate blood stained vitreous condensations w/ some old white VH settled inferiorly -- improving - exam shows mild interval improvement in [facility] - unclear etiology -- no history of DM, but on Xarelto for Afib and history of multiple falls over the last several months--most recently in June 2023 w/ normal head CT - suspect hemorrhagic PVD vs trauma + blood thinner or combination - discussed findings, thoughts, and treatment options - VH precautions reviewed -- minimize activities, keep head elevated, avoid ASA/NSAIDs/blood thinners as able - f/u 4 months, sooner prn -- DFE, OCT  2,3. Hypertensive retinopathy OU - discussed importance of tight BP control - monitor   4. Pseudophakia OU  - s/p CE/IOL OU ([place])  - IOL in good position, doing well  - monitor  Ophthalmic Meds Ordered this visit:  No orders of the defined types were  placed in this encounter.    Return in about 4 months (around 10/23/2022) for f/u VH OD, DFE, OCT.  There are no Patient Instructions on file for this visit.  Explained the diagnoses, plan, and follow up with the patient and they expressed understanding.  Patient expressed understanding of the importance of proper follow up care.   This document serves as a record of services personally performed by Gardiner Sleeper, MD, PhD. It was created on their behalf by Orvan Falconer, an ophthalmic technician. The creation of this record is the provider's dictation and/or activities during the visit.    Electronically signed by: Orvan Falconer, OA, 06/24/22  4:18 PM  This document serves as a record of services personally performed by Gardiner Sleeper, MD, PhD. It was created on their behalf by San Jetty. Owens Shark, OA an ophthalmic technician. The creation of this record is the provider's dictation and/or activities during the visit.    Electronically signed by: San Jetty. Owens Shark, [place] 02.14.2024 4:18 PM  Gardiner Sleeper, M.D., Ph.D. Diseases & Surgery of the Retina and Vitreous [facility]  I have reviewed the above documentation for accuracy and completeness, and I agree with the above. Gardiner Sleeper, M.D., Ph.D. 06/24/22 4:19 PM  Abbreviations: M myopia (nearsighted); A astigmatism; H hyperopia (farsighted); P presbyopia; Mrx spectacle prescription;  CTL contact lenses; OD right eye; OS left eye; OU both eyes  XT exotropia; ET esotropia; PEK punctate epithelial keratitis; PEE punctate epithelial erosions; DES dry eye syndrome; MGD meibomian gland dysfunction; ATs artificial tears; PFAT's preservative free artificial tears; [place] nuclear sclerotic cataract; PSC posterior subcapsular cataract; ERM epi-retinal membrane; PVD posterior vitreous detachment; RD retinal detachment; DM diabetes mellitus; DR diabetic retinopathy; NPDR non-proliferative diabetic retinopathy; PDR proliferative  diabetic retinopathy; CSME clinically significant macular edema; DME diabetic macular edema; dbh dot  blot hemorrhages; CWS cotton wool spot; POAG primary open angle glaucoma; C/D cup-to-disc ratio; HVF humphrey visual field; GVF goldmann visual field; OCT optical coherence tomography; IOP intraocular pressure; BRVO Branch retinal vein occlusion; CRVO central retinal vein occlusion; CRAO central retinal artery occlusion; BRAO branch retinal artery occlusion; RT retinal tear; SB scleral buckle; PPV pars plana vitrectomy; VH Vitreous hemorrhage; PRP panretinal laser photocoagulation; IVK intravitreal kenalog; VMT vitreomacular traction; MH Macular hole;  NVD neovascularization of the disc; NVE neovascularization elsewhere; AREDS age related eye disease study; ARMD age related macular degeneration; POAG primary open angle glaucoma; EBMD epithelial/anterior basement membrane dystrophy; ACIOL anterior chamber intraocular lens; IOL intraocular lens; PCIOL posterior chamber intraocular lens; Phaco/IOL phacoemulsification with intraocular lens placement; [place] photorefractive keratectomy; LASIK laser assisted in situ keratomileusis; HTN hypertension; DM diabetes mellitus; COPD chronic obstructive pulmonary disease

## 2022-06-24 ENCOUNTER — Ambulatory Visit (INDEPENDENT_AMBULATORY_CARE_PROVIDER_SITE_OTHER): Payer: Medicare Other | Admitting: Ophthalmology

## 2022-06-24 ENCOUNTER — Encounter (INDEPENDENT_AMBULATORY_CARE_PROVIDER_SITE_OTHER): Payer: Self-pay | Admitting: Ophthalmology

## 2022-06-24 DIAGNOSIS — H35033 Hypertensive retinopathy, bilateral: Secondary | ICD-10-CM | POA: Diagnosis not present

## 2022-06-24 DIAGNOSIS — Z961 Presence of intraocular lens: Secondary | ICD-10-CM | POA: Diagnosis not present

## 2022-06-24 DIAGNOSIS — I10 Essential (primary) hypertension: Secondary | ICD-10-CM | POA: Diagnosis not present

## 2022-06-24 DIAGNOSIS — H4311 Vitreous hemorrhage, right eye: Secondary | ICD-10-CM | POA: Diagnosis not present

## 2022-10-21 NOTE — Progress Notes (Signed)
[facility] - Clinic Note  10/27/2022     CHIEF COMPLAINT Patient presents for Retina Follow Up   HISTORY OF PRESENT ILLNESS: Lindsay Rush is a 88 y.o. female who presents to the clinic today for:   HPI     Retina Follow Up   Patient presents with  Other.  In right eye.  This started 4 months ago.  I, the attending physician,  performed the HPI with the patient and updated documentation appropriately.        Comments   Patient here for 4 months retina follow up for [facility] OD. Patient states vision OD a little wonky. No eye pain. OS is alright.      Last edited by Rennis Chris, MD on 10/27/2022  8:41 PM.     Pt states vision is the same  Referring physician: Joycelyn Man, NP [address] [place],  [place] [address]  HISTORICAL INFORMATION:   Selected notes from the MEDICAL RECORD NUMBER Referred by Dr. Cathey Endow for vitreous heme OD LEE:  Ocular Hx- PMH-    CURRENT MEDICATIONS: No current outpatient medications on file. (Ophthalmic Drugs)   No current facility-administered medications for this visit. (Ophthalmic Drugs)   Current Outpatient Medications (Other)  Medication Sig   ACETAMINOPHEN EXTRA STRENGTH 500 MG tablet Take 500 mg by mouth 3 (three) times daily.   diclofenac Sodium (VOLTAREN) 1 % GEL Apply 4 g topically 4 (four) times daily.   escitalopram (LEXAPRO) 5 MG tablet Take 5 mg by mouth daily.   ezetimibe (ZETIA) 10 MG tablet Take 10 mg by mouth at bedtime.   Fluticasone-Umeclidin-Vilant (TRELEGY ELLIPTA) 200-62.5-25 MCG/ACT AEPB Inhale 1 puff into the lungs daily.   polyethylene glycol powder (GLYCOLAX/MIRALAX) 17 GM/SCOOP powder Take 17 g by mouth daily.   Rivaroxaban (XARELTO) 15 MG TABS tablet Take 15 mg by mouth daily.   thyroid (ARMOUR) 30 MG tablet Take 30 mg by mouth daily before breakfast.   traZODone (DESYREL) 50 MG tablet Take 50 mg by mouth at bedtime.   digoxin (LANOXIN) 0.125 MG tablet Take 1 tablet  (0.125 mg total) by mouth daily.   furosemide (LASIX) 40 MG tablet Take 1 tablet (40 mg total) by mouth daily.   potassium chloride (KLOR-CON) 20 MEQ packet Take 20 mEq by mouth daily.   No current facility-administered medications for this visit. (Other)   REVIEW OF SYSTEMS: ROS   Positive for: Eyes Negative for: Constitutional, Gastrointestinal, Neurological, Skin, Genitourinary, Musculoskeletal, HENT, Endocrine, Cardiovascular, Respiratory, Psychiatric, Allergic/Imm, Heme/Lymph Last edited by Laddie Aquas, COA on 10/27/2022  1:19 PM.      ALLERGIES Allergies  Allergen Reactions   Codeine    PAST MEDICAL HISTORY Past Medical History:  Diagnosis Date   Atrial fibrillation (HCC)    Chronic diastolic CHF (congestive heart failure) (HCC)    Chronic hypotension    COPD (chronic obstructive pulmonary disease) (HCC)    Dementia (HCC)    Fall    Hyperlipidemia    Hypertension    Hypothyroidism    Insomnia    MCI (mild cognitive impairment)    Thyrotoxicosis    Past Surgical History:  Procedure Laterality Date   ABDOMINAL HYSTERECTOMY     APPENDECTOMY     CHOLECYSTECTOMY     left knee replacement  12/2020   FAMILY HISTORY Family History  Problem Relation Age of Onset   Heart attack Father    SOCIAL HISTORY Social History   Tobacco Use  Smoking status: Never   Smokeless tobacco: Never  Vaping Use   Vaping Use: Every day  Substance Use Topics   Alcohol use: Never   Drug use: Never    Comment: unable to answer       OPHTHALMIC EXAM:  Base Eye Exam     Visual Acuity (Snellen - Linear)       Right Left   Dist [place] 20/30 -2 20/30 +2   Dist ph [place] 20/25 -1 20/25 -2         Tonometry (Tonopen, 1:16 PM)       Right Left   Pressure 19 14         Pupils       Dark Light Shape React APD   Right 3 2 Round Brisk None   Left 3 2 Round Brisk None         Visual Fields (Counting fingers)       Left Right    Full Full         Extraocular  Movement       Right Left    Full, Ortho Full, Ortho         Neuro/Psych     Oriented x3: Yes   Mood/Affect: Normal         Dilation     Both eyes: 1.0% Mydriacyl, 2.5% Phenylephrine @ 1:16 PM           Slit Lamp and Fundus Exam     Slit Lamp Exam       Right Left   Lids/Lashes Dermatochalasis - upper lid Dermatochalasis - upper lid, Meibomian gland dysfunction   Conjunctiva/Sclera White and quiet White and quiet   Cornea well healed cataract wound, tear film debris, 1+ Punctate epithelial erosions well healed cataract wound, tear film debris, 1+fine Punctate epithelial erosions   Anterior Chamber deep, clear, narrow temporal angle deep and clear   Iris Round and dilated Round and moderately dilated   Lens PC IOL in good position with open PC 3 piece PC IOL in good position, 1+PCO   Anterior Vitreous Vitreous syneresis, +RBC's, +pigment, blood stained vitreous condensations clearing, settling inferiorly and turning white, blood clots settled inferiorly turning white -- slightly improved Vitreous syneresis, Posterior vitreous detachment, vitreous condensations         Fundus Exam       Right Left   Disc mild Pallor, Sharp rim mild Pallor, Sharp rim, Compact   C/D Ratio 0.4 0.2   Macula Flat, Blunted foveal reflex, RPE mottling, No heme or edema Flat, Blunted foveal reflex, fine drusen, RPE mottling and clumping, No heme or edema   Vessels mild attenuation, mild tortuosity attenuated, mild tortuosity   Periphery Attached; inferior retina obscured by VH; pigmented CR scar at 1100 -- ?old tear Attached, mild reticular degeneration           Refraction     Wearing Rx       Sphere Cylinder Axis Add   Right -1.75 +1.00 008 +0.00   Left -0.75 +1.00 150 +0.00           IMAGING AND PROCEDURES  Imaging and Procedures for 10/27/2022  OCT, Retina - OU - Both Eyes       Right Eye Quality was good. Central Foveal Thickness: 271. Progression has improved.  Findings include normal foveal contour, no IRF, no SRF (Trace ERM, trace vitreous opacities -- improved).   Left Eye Quality was good. Central Foveal Thickness: 274. Progression  has been stable. Findings include normal foveal contour, no IRF, no SRF.   Notes *Images captured and stored on drive  Diagnosis / Impression:  NFP, no IRF / SRF OU OD: Trace ERM, trace vitreous opacities -- improved  Clinical management:  See below  Abbreviations: NFP - Normal foveal profile. CME - cystoid macular edema. PED - pigment epithelial detachment. IRF - intraretinal fluid. SRF - subretinal fluid. EZ - ellipsoid zone. ERM - epiretinal membrane. ORA - outer retinal atrophy. ORT - outer retinal tubulation. SRHM - subretinal hyper-reflective material. IRHM - intraretinal hyper-reflective material            ASSESSMENT/PLAN:    ICD-10-CM   1. Vitreous hemorrhage of right eye (HCC)  H43.11 OCT, Retina - OU - Both Eyes    2. Essential hypertension  I10     3. Hypertensive retinopathy of both eyes  H35.033     4. Pseudophakia, both eyes  Z96.1       Vitreous hemorrhage OD -- improved - pt and daughter report several month history of floaters in vision OD - pt with dementia and mild cognitive impairment -- poor historian - moderate blood stained vitreous condensations w/ some old white VH settled inferiorly -- improving - exam shows interval improvement in VH centrally and old white blood clots settled inferiorly - unclear etiology -- no history of DM, but on Xarelto for Afib and history of multiple falls over the last several months--most recently in June 2023 w/ normal head CT - suspect hemorrhagic PVD vs trauma + blood thinner or combination - discussed findings, thoughts, and treatment options - VH precautions reviewed -- minimize activities, keep head elevated, avoid ASA/NSAIDs/blood thinners as able - f/u 6-9 months, sooner prn -- DFE, OCT  2,3. Hypertensive retinopathy OU - discussed  importance of tight BP control - monitor   4. Pseudophakia OU  - s/p CE/IOL OU ([place])  - IOL in good position, doing well  - monitor  Ophthalmic Meds Ordered this visit:  No orders of the defined types were placed in this encounter.    Return for f/u 6-9 months, VH OD, DFE, OCT.  There are no Patient Instructions on file for this visit.  Explained the diagnoses, plan, and follow up with the patient and they expressed understanding.  Patient expressed understanding of the importance of proper follow up care.   This document serves as a record of services personally performed by Karie Chimera, MD, PhD. It was created on their behalf by Gerilyn Nestle, COT an ophthalmic technician. The creation of this record is the provider's dictation and/or activities during the visit.    Electronically signed by:  Charlette Caffey, COT  10/27/22 8:48 PM  This document serves as a record of services personally performed by Karie Chimera, MD, PhD. It was created on their behalf by Glee Arvin. Manson Passey, OA an ophthalmic technician. The creation of this record is the provider's dictation and/or activities during the visit.    Electronically signed by: Glee Arvin. Kristopher Oppenheim 06.18.2024 8:48 PM  Karie Chimera, M.D., Ph.D. Diseases & Surgery of the Retina and Vitreous [facility]  I have reviewed the above documentation for accuracy and completeness, and I agree with the above. Karie Chimera, M.D., Ph.D. 10/27/22 8:49 PM   Abbreviations: M myopia (nearsighted); A astigmatism; H hyperopia (farsighted); P presbyopia; Mrx spectacle prescription;  CTL contact lenses; OD right eye; OS left eye; OU both eyes  XT  exotropia; ET esotropia; PEK punctate epithelial keratitis; PEE punctate epithelial erosions; DES dry eye syndrome; MGD meibomian gland dysfunction; ATs artificial tears; PFAT's preservative free artificial tears; [place] nuclear sclerotic cataract; PSC posterior subcapsular  cataract; ERM epi-retinal membrane; PVD posterior vitreous detachment; RD retinal detachment; DM diabetes mellitus; DR diabetic retinopathy; NPDR non-proliferative diabetic retinopathy; PDR proliferative diabetic retinopathy; CSME clinically significant macular edema; DME diabetic macular edema; dbh dot blot hemorrhages; CWS cotton wool spot; POAG primary open angle glaucoma; C/D cup-to-disc ratio; HVF humphrey visual field; GVF goldmann visual field; OCT optical coherence tomography; IOP intraocular pressure; BRVO Branch retinal vein occlusion; CRVO central retinal vein occlusion; CRAO central retinal artery occlusion; BRAO branch retinal artery occlusion; RT retinal tear; SB scleral buckle; PPV pars plana vitrectomy; VH Vitreous hemorrhage; PRP panretinal laser photocoagulation; IVK intravitreal kenalog; VMT vitreomacular traction; MH Macular hole;  NVD neovascularization of the disc; NVE neovascularization elsewhere; AREDS age related eye disease study; ARMD age related macular degeneration; POAG primary open angle glaucoma; EBMD epithelial/anterior basement membrane dystrophy; ACIOL anterior chamber intraocular lens; IOL intraocular lens; PCIOL posterior chamber intraocular lens; Phaco/IOL phacoemulsification with intraocular lens placement; [place] photorefractive keratectomy; LASIK laser assisted in situ keratomileusis; HTN hypertension; DM diabetes mellitus; COPD chronic obstructive pulmonary disease

## 2022-10-27 ENCOUNTER — Ambulatory Visit (INDEPENDENT_AMBULATORY_CARE_PROVIDER_SITE_OTHER): Payer: Medicare Other | Admitting: Ophthalmology

## 2022-10-27 ENCOUNTER — Encounter (INDEPENDENT_AMBULATORY_CARE_PROVIDER_SITE_OTHER): Payer: Self-pay | Admitting: Ophthalmology

## 2022-10-27 DIAGNOSIS — H4311 Vitreous hemorrhage, right eye: Secondary | ICD-10-CM | POA: Diagnosis not present

## 2022-10-27 DIAGNOSIS — H35033 Hypertensive retinopathy, bilateral: Secondary | ICD-10-CM | POA: Diagnosis not present

## 2022-10-27 DIAGNOSIS — I10 Essential (primary) hypertension: Secondary | ICD-10-CM | POA: Diagnosis not present

## 2022-10-27 DIAGNOSIS — Z961 Presence of intraocular lens: Secondary | ICD-10-CM

## 2022-11-19 ENCOUNTER — Ambulatory Visit
Admission: RE | Admit: 2022-11-19 | Discharge: 2022-11-19 | Disposition: A | Discharge status: Home / Self Care | Payer: Medicare Other | Source: Ambulatory Visit | Attending: Student | Admitting: Student

## 2022-11-19 DIAGNOSIS — J849 Interstitial pulmonary disease, unspecified: Secondary | ICD-10-CM

## 2023-01-20 ENCOUNTER — Ambulatory Visit: Payer: Medicare Other | Admitting: Psychiatry

## 2023-01-20 ENCOUNTER — Encounter: Payer: Self-pay | Admitting: Psychiatry

## 2023-01-20 VITALS — BP 166/88 | HR 66 | Ht 62.0 in | Wt 113.0 lb

## 2023-01-20 DIAGNOSIS — F03A0 Unspecified dementia, mild, without behavioral disturbance, psychotic disturbance, mood disturbance, and anxiety: Secondary | ICD-10-CM

## 2023-01-20 NOTE — Patient Instructions (Signed)
Start B12 1000 mcg daily

## 2023-01-20 NOTE — Progress Notes (Signed)
   CC:  memory loss  Follow-up Visit  Last visit: 06/11/22  Brief HPI: 88 year old female with a history of COPD, afib, HLD, hypothyroidism, depression/anxiety who follows in clinic for memory loss. [facility] 04/05/22 showed chronic encephalomalacia of the left frontal lobe, mild generalized atrophy, and mild chronic small vessel ischemic change.   At her last visit she was recommended to start B12 supplementation.  Interval History: Memory has slightly worsened since her last visit. Repeats herself often. She is still in her assisted living facility. States she does not feel she needs to be there. However she does require assistance with her ADLs including cooking, cleaning, medications, and showering. Had one mild fall a few months ago, but she does not remember this. Never started B12 supplements.  Physical Exam:   Vital Signs: BP (!) 166/88 (BP Location: Left Arm, Patient Position: Sitting, Cuff Size: Normal)   Pulse 66   Ht 5\' 2"  (1.575 m)   Wt 113 lb (51.3 kg)   BMI 20.67 kg/m  GENERAL:  well appearing, in no acute distress, alert  SKIN:  Color, texture, turgor normal. No rashes or lesions HEAD:  Normocephalic/atraumatic. RESP: normal respiratory effort  NEUROLOGICAL: Mental Status:     01/20/2023    1:20 PM 06/11/2022    3:15 PM  Montreal Cognitive Assessment   Visuospatial/ Executive (0/5) 5 5  Naming (0/3) 3 3  Attention: Read list of digits (0/2) 2 2  Attention: Read list of letters (0/1) 1 1  Attention: Serial 7 subtraction starting at 100 (0/3) 1 1  Language: Repeat phrase (0/2) 2 2  Language : Fluency (0/1) 0 1  Abstraction (0/2) 2 2  Delayed Recall (0/5) 0 0  Orientation (0/6) 4 6  Total 20 23  Repeats questions frequently during this visit. Cranial Nerves: PERRL, face symmetric, no dysarthria, hearing grossly intact Motor: moves all extremities equally Gait: normal-based.  IMPRESSION: 88 year old female with a history of COPD, afib, HLD, hypothyroidism,  depression/anxiety who presents for follow up of memory loss. Her memory has subjectively declined slightly, which is reflected in her MOCA score (23->20). She appears more forgetful during today's visit as well, repeating herself frequently and asking the same question multiple times. Counseled her on starting B12 supplementation, which she states she will pick up after this appointment. Suspect she is developing early dementia given her decline over the past 6 months. Discussed starting donepezil, which she declined at this time. Will follow up in 6 months after she starts B12 and reassess starting a memory medication at that time.  PLAN: -Start B12 1000 mcg daily -Patient would prefer to wait until next appointment to consider memory medications. Will follow up in 6 months to reassess    Follow-up: 6 months  I spent a total of 29 minutes on the date of the service. Discussed medication side effects, adverse reactions and drug interactions. Written educational materials and patient instructions outlining all of the above were given.  Ocie Doyne, MD 01/20/23 2:06 PM

## 2023-05-31 NOTE — Progress Notes (Signed)
[facility] - Clinic Note  06/07/2023     CHIEF COMPLAINT Patient presents for Retina Follow Up   HISTORY OF PRESENT ILLNESS: Lindsay Rush is a 89 y.o. female who presents to the clinic today for:   HPI     Retina Follow Up   Patient presents with  Other.  In right eye.  This started 7 months ago.  I, the attending physician,  performed the HPI with the patient and updated documentation appropriately.        Comments   Patient here for 7 months retina follow up for [facility] OD. Patient states vision doing the same. No eye pain.       Last edited by Rennis Chris, MD on 06/07/2023  1:37 PM.    Pt states she has more floaters in her right eye, but doesn't know how long they've been there, pt denies any recent falls, but pts daughter states she fell 2 weeks ago, she is unsure if she hit her head or not. Pt's daughter also reports pt no longer sleeping with head elevated.  Referring physician: Joycelyn Man, NP [address] [place],  [place] [address]  HISTORICAL INFORMATION:   Selected notes from the MEDICAL RECORD NUMBER Referred by Dr. Cathey Endow for vitreous heme OD LEE:  Ocular Hx- PMH-    CURRENT MEDICATIONS: No current outpatient medications on file. (Ophthalmic Drugs)   No current facility-administered medications for this visit. (Ophthalmic Drugs)   Current Outpatient Medications (Other)  Medication Sig   ACETAMINOPHEN EXTRA STRENGTH 500 MG tablet Take 500 mg by mouth 3 (three) times daily.   diclofenac Sodium (VOLTAREN) 1 % GEL Apply 4 g topically 4 (four) times daily.   escitalopram (LEXAPRO) 5 MG tablet Take 5 mg by mouth daily.   ezetimibe (ZETIA) 10 MG tablet Take 10 mg by mouth at bedtime.   Fluticasone-Umeclidin-Vilant (TRELEGY ELLIPTA) 200-62.5-25 MCG/ACT AEPB Inhale 1 puff into the lungs daily.   polyethylene glycol powder (GLYCOLAX/MIRALAX) 17 GM/SCOOP powder Take 17 g by mouth daily.   Rivaroxaban (XARELTO) 15 MG  TABS tablet Take 15 mg by mouth daily.   thyroid (ARMOUR) 30 MG tablet Take 30 mg by mouth daily before breakfast.   traZODone (DESYREL) 50 MG tablet Take 50 mg by mouth at bedtime.   digoxin (LANOXIN) 0.125 MG tablet Take 1 tablet (0.125 mg total) by mouth daily.   furosemide (LASIX) 40 MG tablet Take 1 tablet (40 mg total) by mouth daily.   potassium chloride (KLOR-CON) 20 MEQ packet Take 20 mEq by mouth daily.   No current facility-administered medications for this visit. (Other)   REVIEW OF SYSTEMS: ROS   Positive for: Eyes Negative for: Constitutional, Gastrointestinal, Neurological, Skin, Genitourinary, Musculoskeletal, HENT, Endocrine, Cardiovascular, Respiratory, Psychiatric, Allergic/Imm, Heme/Lymph Last edited by Laddie Aquas, COA on 06/07/2023  1:02 PM.     ALLERGIES Allergies  Allergen Reactions   Codeine    PAST MEDICAL HISTORY Past Medical History:  Diagnosis Date   Atrial fibrillation (HCC)    Chronic diastolic CHF (congestive heart failure) (HCC)    Chronic hypotension    COPD (chronic obstructive pulmonary disease) (HCC)    Dementia (HCC)    Fall    Hyperlipidemia    Hypertension    Hypothyroidism    Insomnia    MCI (mild cognitive impairment)    Thyrotoxicosis    Past Surgical History:  Procedure Laterality Date   ABDOMINAL HYSTERECTOMY     APPENDECTOMY  CHOLECYSTECTOMY     left knee replacement  12/2020   FAMILY HISTORY Family History  Problem Relation Age of Onset   Heart attack Father    SOCIAL HISTORY Social History   Tobacco Use   Smoking status: Never   Smokeless tobacco: Never  Vaping Use   Vaping status: Every Day  Substance Use Topics   Alcohol use: Never   Drug use: Never    Comment: unable to answer       OPHTHALMIC EXAM:  Base Eye Exam     Visual Acuity (Snellen - Linear)       Right Left   Dist [place] 20/80 20/40 -1   Dist ph [place] 20/40 20/25         Tonometry (Tonopen, 1:00 PM)       Right Left   Pressure  16 07         Pupils       Dark Light Shape React APD   Right 3 2 Round Brisk None   Left 3 2 Round Brisk None         Visual Fields (Counting fingers)       Left Right    Full Full         Extraocular Movement       Right Left    Full, Ortho Full, Ortho         Neuro/Psych     Oriented x3: Yes   Mood/Affect: Normal         Dilation     Both eyes: 1.0% Mydriacyl, 2.5% Phenylephrine @ 12:59 PM           Slit Lamp and Fundus Exam     Slit Lamp Exam       Right Left   Lids/Lashes Dermatochalasis - upper lid, mild MGD Dermatochalasis - upper lid, Meibomian gland dysfunction   Conjunctiva/Sclera White and quiet White and quiet   Cornea well healed cataract wound, tear film debris, 2-3+ Punctate epithelial erosions well healed cataract wound, mild tear film debris, 1+ inferior Punctate epithelial erosions   Anterior Chamber deep, clear, narrow temporal angle deep and clear   Iris Round and dilated Round and moderately dilated   Lens PC IOL in good position with open PC 3 piece PC IOL in good position, 1+PCO   Anterior Vitreous Vitreous syneresis, +RBC's, +pigment, blood stained vitreous condensations -- increased, old white VH settled inferiorly Vitreous syneresis, Posterior vitreous detachment, vitreous condensations         Fundus Exam       Right Left   Disc mild Pallor, Sharp rim mild Pallor, Sharp rim, Compact   C/D Ratio 0.4 0.2   Macula Partially obscured by VH, flat, Blunted foveal reflex, RPE mottling, No heme or edema Flat, Blunted foveal reflex, fine drusen, RPE mottling and clumping, No heme or edema   Vessels mild attenuation, mild tortuosity attenuated, Tortuous   Periphery Attached; inferior retina obscured by VH; pigmented CR scar at 1100 -- ?old tear Attached, mild reticular degeneration, No heme           Refraction     Wearing Rx       Sphere Cylinder Axis Add   Right -1.75 +1.00 008 +0.00   Left -0.75 +1.00 150 +0.00            IMAGING AND PROCEDURES  Imaging and Procedures for 06/07/2023  OCT, Retina - OU - Both Eyes       Right Eye  Quality was good. Central Foveal Thickness: 275. Progression has worsened. Findings include normal foveal contour, no IRF, no SRF (Trace ERM, interval increase in vitreous opacities inferior hemisphere).   Left Eye Quality was good. Central Foveal Thickness: 270. Progression has been stable. Findings include normal foveal contour, no IRF, no SRF.   Notes *Images captured and stored on drive  Diagnosis / Impression:  NFP, no IRF / SRF OU OD: Trace ERM, interval increase in vitreous opacities inferior hemisphere  Clinical management:  See below  Abbreviations: NFP - Normal foveal profile. CME - cystoid macular edema. PED - pigment epithelial detachment. IRF - intraretinal fluid. SRF - subretinal fluid. EZ - ellipsoid zone. ERM - epiretinal membrane. ORA - outer retinal atrophy. ORT - outer retinal tubulation. SRHM - subretinal hyper-reflective material. IRHM - intraretinal hyper-reflective material            ASSESSMENT/PLAN:    ICD-10-CM   1. Vitreous hemorrhage of right eye (HCC)  H43.11 OCT, Retina - OU - Both Eyes    CANCELED: Fluorescein Angiography Optos (Transit OD)    2. Essential hypertension  I10     3. Hypertensive retinopathy of both eyes  H35.033 CANCELED: Fluorescein Angiography Optos (Transit OD)    4. Pseudophakia, both eyes  Z96.1       Vitreous hemorrhage OD -- slightly worse today - pt and daughter report several month history of floaters in vision OD - pt with dementia and mild cognitive impairment -- poor historian - moderate blood stained vitreous condensations w/ some old white VH settled inferiorly -- slightly increased cnetral - exam shows persistent/slightly increased VH centrally and old white blood clots settled inferiorly - unclear etiology -- no history of DM, but on Xarelto for Afib and history of multiple falls over  the last several months - suspect hemorrhagic PVD vs trauma + blood thinner or combination - discussed findings, thoughts, and treatment options - recommend FA to look for other vascular etiologies or contributors -- pt unable to do FA today, but will do at next visit - VH precautions reviewed -- minimize activities, keep head elevated, avoid ASA/NSAIDs/blood thinners as able - f/u 6-8 weeks, sooner prn -- DFE, OCT, FA (transit OD)  2,3. Hypertensive retinopathy OU - discussed importance of tight BP control - monitor   4. Pseudophakia OU  - s/p CE/IOL OU ([place])  - IOL in good position, doing well  - monitor  Ophthalmic Meds Ordered this visit:  No orders of the defined types were placed in this encounter.    Return for f/u 6-8 weeks, VH OD, DFE, OCT, FA transit OD.  There are no Patient Instructions on file for this visit.  Explained the diagnoses, plan, and follow up with the patient and they expressed understanding.  Patient expressed understanding of the importance of proper follow up care.   This document serves as a record of services personally performed by Karie Chimera, MD, PhD. It was created on their behalf by Glee Arvin. Manson Passey, OA an ophthalmic technician. The creation of this record is the provider's dictation and/or activities during the visit.    Electronically signed by: Glee Arvin. Manson Passey, OA 06/07/23 5:10 PM  Karie Chimera, M.D., Ph.D. Diseases & Surgery of the Retina and Vitreous [facility]  I have reviewed the above documentation for accuracy and completeness, and I agree with the above. Karie Chimera, M.D., Ph.D. 06/07/23 5:13 PM   Abbreviations: M myopia (nearsighted); A astigmatism; H  hyperopia (farsighted); P presbyopia; Mrx spectacle prescription;  CTL contact lenses; OD right eye; OS left eye; OU both eyes  XT exotropia; ET esotropia; PEK punctate epithelial keratitis; PEE punctate epithelial erosions; DES dry eye syndrome; MGD  meibomian gland dysfunction; ATs artificial tears; PFAT's preservative free artificial tears; NSC nuclear sclerotic cataract; PSC posterior subcapsular cataract; ERM epi-retinal membrane; PVD posterior vitreous detachment; RD retinal detachment; DM diabetes mellitus; DR diabetic retinopathy; NPDR non-proliferative diabetic retinopathy; PDR proliferative diabetic retinopathy; CSME clinically significant macular edema; DME diabetic macular edema; dbh dot blot hemorrhages; CWS cotton wool spot; POAG primary open angle glaucoma; C/D cup-to-disc ratio; HVF humphrey visual field; GVF goldmann visual field; OCT optical coherence tomography; IOP intraocular pressure; BRVO Branch retinal vein occlusion; CRVO central retinal vein occlusion; CRAO central retinal artery occlusion; BRAO branch retinal artery occlusion; RT retinal tear; SB scleral buckle; PPV pars plana vitrectomy; VH Vitreous hemorrhage; PRP panretinal laser photocoagulation; IVK intravitreal kenalog; VMT vitreomacular traction; MH Macular hole;  NVD neovascularization of the disc; NVE neovascularization elsewhere; AREDS age related eye disease study; ARMD age related macular degeneration; POAG primary open angle glaucoma; EBMD epithelial/anterior basement membrane dystrophy; ACIOL anterior chamber intraocular lens; IOL intraocular lens; PCIOL posterior chamber intraocular lens; Phaco/IOL phacoemulsification with intraocular lens placement; PRK photorefractive keratectomy; LASIK laser assisted in situ keratomileusis; HTN hypertension; DM diabetes mellitus; COPD chronic obstructive pulmonary disease

## 2023-06-07 ENCOUNTER — Ambulatory Visit (INDEPENDENT_AMBULATORY_CARE_PROVIDER_SITE_OTHER): Payer: Medicare Other | Admitting: Ophthalmology

## 2023-06-07 ENCOUNTER — Encounter (INDEPENDENT_AMBULATORY_CARE_PROVIDER_SITE_OTHER): Payer: Self-pay | Admitting: Ophthalmology

## 2023-06-07 DIAGNOSIS — H35033 Hypertensive retinopathy, bilateral: Secondary | ICD-10-CM | POA: Diagnosis not present

## 2023-06-07 DIAGNOSIS — Z961 Presence of intraocular lens: Secondary | ICD-10-CM | POA: Diagnosis not present

## 2023-06-07 DIAGNOSIS — H4311 Vitreous hemorrhage, right eye: Secondary | ICD-10-CM

## 2023-06-07 DIAGNOSIS — I10 Essential (primary) hypertension: Secondary | ICD-10-CM

## 2023-06-09 ENCOUNTER — Telehealth: Payer: Self-pay | Admitting: Psychiatry

## 2023-06-09 NOTE — Telephone Encounter (Signed)
Called daughter back. Wants her mother seen sooner than scheduled appt with Dr. Frances Furbish 08/2023. She previously followed with Dr. Delena Bali. I scheduled sooner appt w/ Dr. Epimenio Foot 06/10/23 at 11:30am. Asked they check in by 11:00am to ensure we had time to complete repeat MOCA. Asked they bring updated med list and insurance cards to appt.  She feels mother had Covid recently. Three weeks ago, very sick with respiratory infection. She also fell 2 wk ago. She is in assisted living community/[facility]. She has been there about 3 yr. She has her own room currently. Feels she may have UTI as well. Requested facility check UA but still waiting to hear back about this. She gets UTI's frequently.   She had convo last night with mother that her brain is playing tricks on her. Having time lapses. Calling daughter 8 times per day. She has to talk her down. One example is her mother told her [place], alone, terrified. Don't know how she is going to get back to her house. Daughter aware that 20 yr ago, she was at [facility] on [address] with husband. Feels she's bringing this memory back up.  In the last wk, memory/confusion worse. Does not know where she is. This morning, she called her daughter telling her she was at [facility] in [place]. [place] are from [place].   They are going to apply for Medicaid in the next 6 months d/t financial reasons. She has a son who is helping with [place] Daughter handles day to day things. Medicaid rooms require a roommate. She would have to move to a shared room if she goes to Medicaid.  Daughter wants MD to be aware she will deny memory issues at office visit and fall two weeks ago. I did tell her if sx severe, she should discuss with facility and go to urgent care/ER for immediate evaluation if needed. She understands.

## 2023-06-09 NOTE — Telephone Encounter (Signed)
Pt's daughter, Mirna Mires (on [place])  patient is struggling, calling about 6 to 8 times, does not know where she is and feeling very alone. Would like a call for a earlier appt or to be assigned to another neurologist with a earlier appt to get her in sooner than later.

## 2023-06-09 NOTE — Progress Notes (Unsigned)
[facility]  PATIENT: Lindsay Rush DOB: 1934/06/12  REFERRING DOCTOR OR PCP:  *** SOURCE: ***  _________________________________   HISTORICAL  CHIEF COMPLAINT:  No chief complaint on file.   HISTORY OF PRESENT ILLNESS:  [name] is a 89 year old woman with dementia who is transferring care from Dr. Delena Bali      01/20/2023    1:20 PM 06/11/2022    3:15 PM  Montreal Cognitive Assessment   Visuospatial/ Executive (0/5) 5 5  Naming (0/3) 3 3  Attention: Read list of digits (0/2) 2 2  Attention: Read list of letters (0/1) 1 1  Attention: Serial 7 subtraction starting at 100 (0/3) 1 1  Language: Repeat phrase (0/2) 2 2  Language : Fluency (0/1) 0 1  Abstraction (0/2) 2 2  Delayed Recall (0/5) 0 0  Orientation (0/6) 4 6  Total 20 23    Imaging: CT scan of the head shows mild generalized cortical atrophy, and chronic microvascular ischemic changes.  No acute findings.   REVIEW OF SYSTEMS: Constitutional: No fevers, chills, sweats, or change in appetite Eyes: No visual changes, double vision, eye pain Ear, nose and throat: No hearing loss, ear pain, nasal congestion, sore throat Cardiovascular: No chest pain, palpitations Respiratory:  No shortness of breath at rest or with exertion.   No wheezes GastrointestinaI: No nausea, vomiting, diarrhea, abdominal pain, fecal incontinence Genitourinary:  No dysuria, urinary retention or frequency.  No nocturia. Musculoskeletal:  No neck pain, back pain Integumentary: No rash, pruritus, skin lesions Neurological: as above Psychiatric: No depression at this time.  No anxiety Endocrine: No palpitations, diaphoresis, change in appetite, change in weigh or increased thirst Hematologic/Lymphatic:  No anemia, purpura, petechiae. Allergic/Immunologic: No itchy/runny eyes, nasal congestion, recent allergic reactions, rashes  ALLERGIES: Allergies  Allergen Reactions   Codeine     HOME  MEDICATIONS:  Current Outpatient Medications:    ACETAMINOPHEN EXTRA STRENGTH 500 MG tablet, Take 500 mg by mouth 3 (three) times daily., Disp: , Rfl:    diclofenac Sodium (VOLTAREN) 1 % GEL, Apply 4 g topically 4 (four) times daily., Disp: 100 g, Rfl: 0   digoxin (LANOXIN) 0.125 MG tablet, Take 1 tablet (0.125 mg total) by mouth daily., Disp: 30 tablet, Rfl: 0   escitalopram (LEXAPRO) 5 MG tablet, Take 5 mg by mouth daily., Disp: , Rfl:    ezetimibe (ZETIA) 10 MG tablet, Take 10 mg by mouth at bedtime., Disp: , Rfl:    Fluticasone-Umeclidin-Vilant (TRELEGY ELLIPTA) 200-62.5-25 MCG/ACT AEPB, Inhale 1 puff into the lungs daily., Disp: , Rfl:    furosemide (LASIX) 40 MG tablet, Take 1 tablet (40 mg total) by mouth daily., Disp: 30 tablet, Rfl: 0   polyethylene glycol powder (GLYCOLAX/MIRALAX) 17 GM/SCOOP powder, Take 17 g by mouth daily., Disp: , Rfl:    potassium chloride (KLOR-CON) 20 MEQ packet, Take 20 mEq by mouth daily., Disp: 30 packet, Rfl: 0   Rivaroxaban (XARELTO) 15 MG TABS tablet, Take 15 mg by mouth daily., Disp: , Rfl:    thyroid (ARMOUR) 30 MG tablet, Take 30 mg by mouth daily before breakfast., Disp: , Rfl:    traZODone (DESYREL) 50 MG tablet, Take 50 mg by mouth at bedtime., Disp: , Rfl:   PAST MEDICAL HISTORY: Past Medical History:  Diagnosis Date   Atrial fibrillation (HCC)    Chronic diastolic CHF (congestive heart failure) (HCC)    Chronic hypotension    COPD (chronic obstructive pulmonary disease) (HCC)    Dementia (HCC)  Fall    Hyperlipidemia    Hypertension    Hypothyroidism    Insomnia    MCI (mild cognitive impairment)    Thyrotoxicosis     PAST SURGICAL HISTORY: Past Surgical History:  Procedure Laterality Date   ABDOMINAL HYSTERECTOMY     APPENDECTOMY     CHOLECYSTECTOMY     left knee replacement  12/2020    FAMILY HISTORY: Family History  Problem Relation Age of Onset   Heart attack Father     SOCIAL HISTORY: Social History    Socioeconomic History   Marital status: Married    Spouse name: Not on file   Number of children: 2   Years of education: Not on file   Highest education level: High school graduate  Occupational History    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  Tobacco Use   Smoking status: Never   Smokeless tobacco: Never  Vaping Use   Vaping status: Every Day  Substance and Sexual Activity   Alcohol use: Never   Drug use: Never    Comment: unable to answer   Sexual activity: Not Currently  Other Topics Concern   Not on file  Social History Narrative   06/11/21 resides at [facility]   Social Drivers of Health   Financial Resource Strain: Not on file  Food Insecurity: Not on file  Transportation Needs: Not on file  Physical Activity: Not on file  Stress: Not on file  Social Connections: Not on file  Intimate Partner Violence: Not on file       PHYSICAL EXAM  There were no vitals filed for this visit.  There is no height or weight on file to calculate BMI.   General: The patient is well-developed and well-nourished and in no acute distress  HEENT:  Head is [place]/AT.  Sclera are anicteric.  Funduscopic exam shows normal optic discs and retinal vessels.  Neck: No carotid bruits are noted.  The neck is nontender.  Cardiovascular: The heart has a regular rate and rhythm with a normal S1 and S2. There were no murmurs, gallops or rubs.    Skin: Extremities are without rash or  edema.  Musculoskeletal:  Back is nontender  Neurologic Exam  Mental status: The patient is alert and oriented x 3 at the time of the examination. The patient has apparent normal recent and remote memory, with an apparently normal attention span and concentration ability.   Speech is normal.  Cranial nerves: Extraocular movements are full. Pupils are equal, round, and reactive to light and accomodation.  Visual fields are full.  Facial symmetry is present. There is good facial sensation to soft touch  bilaterally.Facial strength is normal.  Trapezius and sternocleidomastoid strength is normal. No dysarthria is noted.  The tongue is midline, and the patient has symmetric elevation of the soft palate. No obvious hearing deficits are noted.  Motor:  Muscle bulk is normal.   Tone is normal. Strength is  5 / 5 in all 4 extremities.   Sensory: Sensory testing is intact to pinprick, soft touch and vibration sensation in all 4 extremities.  Coordination: Cerebellar testing reveals good finger-nose-finger and heel-to-shin bilaterally.  Gait and station: Station is normal.   Gait is normal. Tandem gait is normal. Romberg is negative.   Reflexes: Deep tendon reflexes are symmetric and normal bilaterally.   Plantar responses are flexor.    DIAGNOSTIC DATA (LABS, IMAGING, TESTING) - I reviewed patient records, labs, notes, testing and imaging myself where available.  Lab Results  Component Value Date   WBC 9.6 02/14/2022   HGB 15.0 02/14/2022   HCT 43.6 02/14/2022   MCV 96.5 02/14/2022   PLT 217 02/14/2022      Component Value Date/Time   NA 138 02/14/2022 1718   K 4.1 02/14/2022 1718   CL 103 02/14/2022 1718   CO2 24 02/14/2022 1718   GLUCOSE 102 (H) 02/14/2022 1718   BUN 11 02/14/2022 1718   CREATININE 0.95 02/14/2022 1718   CALCIUM 9.7 02/14/2022 1718   PROT 7.7 02/14/2022 1718   ALBUMIN 4.3 02/14/2022 1718   AST 17 02/14/2022 1718   ALT 13 02/14/2022 1718   ALKPHOS 50 02/14/2022 1718   BILITOT 0.9 02/14/2022 1718   GFRNONAA 59 (L) 02/14/2022 1718   No results found for: "CHOL", "HDL", "LDLCALC", "LDLDIRECT", "TRIG", "CHOLHDL" No results found for: "HGBA1C" Lab Results  Component Value Date   VITAMINB12 243 06/11/2021   Lab Results  Component Value Date   TSH 1.264 04/07/2021       ASSESSMENT AND PLAN  ***   Eustace Hur A. Epimenio Foot, MD, [facility] 06/09/2023, 10:07 PM Certified in Neurology, Clinical Neurophysiology, Sleep Medicine and Neuroimaging  [facility] [address], [address] [place], [place] [address] [phone]

## 2023-06-10 ENCOUNTER — Ambulatory Visit: Payer: Medicare Other | Admitting: Neurology

## 2023-06-10 ENCOUNTER — Encounter: Payer: Self-pay | Admitting: Neurology

## 2023-06-10 VITALS — BP 158/94 | HR 67 | Ht 62.0 in | Wt 111.0 lb

## 2023-06-10 DIAGNOSIS — I482 Chronic atrial fibrillation, unspecified: Secondary | ICD-10-CM

## 2023-06-10 DIAGNOSIS — R451 Restlessness and agitation: Secondary | ICD-10-CM

## 2023-06-10 DIAGNOSIS — F03A11 Unspecified dementia, mild, with agitation: Secondary | ICD-10-CM | POA: Diagnosis not present

## 2023-06-10 MED ORDER — ESCITALOPRAM OXALATE 10 MG PO TABS: 10.0000 mg | ORAL_TABLET | Freq: Every day | ORAL | 11 refills | Status: AC

## 2023-06-10 MED ORDER — DONEPEZIL HCL 5 MG PO TABS: 5.0000 mg | ORAL_TABLET | Freq: Every day | ORAL | 1 refills | Status: DC

## 2023-06-13 LAB — ATN PROFILE
A -- Beta-amyloid 42/40 Ratio: 0.092 — ABNORMAL LOW (ref >0.102)
Beta-amyloid 40: 251.87 pg/mL
Beta-amyloid 42: 23.1 pg/mL
N -- NfL, Plasma: 4.2 pg/mL (ref 0.00–11.55)
T -- p-tau181: 1.06 pg/mL — ABNORMAL HIGH (ref 0.00–0.97)

## 2023-06-13 LAB — VITAMIN B12: Vitamin B-12: 229 pg/mL — ABNORMAL LOW (ref 232–1245)

## 2023-06-13 LAB — TSH: TSH: 0.506 u[IU]/mL (ref 0.450–4.500)

## 2023-06-15 ENCOUNTER — Telehealth: Payer: Self-pay | Admitting: Neurology

## 2023-06-15 ENCOUNTER — Encounter: Payer: Self-pay | Admitting: *Deleted

## 2023-06-15 NOTE — Telephone Encounter (Signed)
 Pt's daughter states that from pt's last appointment there was supposed to be an increase in pt's anti depressant medication from 5 to 10 mg(daughter does not recall name of med) and a new med was to be called in for pt. Aricept .  Daughter has been in contact with Health director of [facility] and she has no record of change in meds for pt.  She is asking if the new orders can be emailed to her and she will make sure pt gets appropriate medication.  Daughter has given the front desk # for [facility] [phone] and the email for the Health director(which is Laurita) her email is [phone]

## 2023-06-15 NOTE — Telephone Encounter (Signed)
 Faxed letter asking to start pt on Aricept  5mg  po qdx4 weeks. If tolerating well, can increase to 10mg  po after that. Also asked to increase escitalopram  from 5 to 10mg  po every day and to have pt start on Vitamin B12 1000mcg po every day. Included copy of labs. Received fax confirmation.

## 2023-06-15 NOTE — Telephone Encounter (Signed)
I called [facility] and got their fax# [phone].  Letter printed with med changes he recommended. Waiting on signature

## 2023-06-15 NOTE — Telephone Encounter (Signed)
 Called daughter back and LVM. Relayed below recommendations and made her aware this was faxed.   Also relayed results per MD: The blood test we did was consistent with Alzheimer's.  Therefore, I would like to increase the donepezil  to a higher dose if she can tolerate the 5 mg dose that we started last week.  They can call us  in about another 3 weeks to see if they want to go up to the higher dose (if she has no side effects on 5 mg) Additionally, vitamin B12 was borderline low.  They can take the 1000 mcg supplements that are available at any food or drugstore, 1 a day.  Later in the year the B12 can be repeated and if still low she would need to go on shots   Asked her to call back if any questions/concerns.

## 2023-06-16 NOTE — Telephone Encounter (Signed)
 I called daughter back and let her know [facility] received fax from yesterday. She verbalized understanding.

## 2023-06-16 NOTE — Telephone Encounter (Addendum)
 Pt's daughter, facility has not received the order. Facility said their fax machine is broken. Asking if I can come pick up hard copy of the order so can drop off [facility] at [facility] today. Trying figure out to get the order to them. Would like a call back.

## 2023-06-16 NOTE — Telephone Encounter (Signed)
 Called and spoke w/ Nate at [facility]. States alternate fax is [phone]. I relayed this is fax# I sent to. He states this is in other building and he will go check to see if fax there and will call back. Advised it should be total of 8 pages including cover sheet.

## 2023-06-16 NOTE — Telephone Encounter (Signed)
 Lindsay Rush called back from [facility], stated he wanted to confirm they received fax.

## 2023-07-19 ENCOUNTER — Encounter (INDEPENDENT_AMBULATORY_CARE_PROVIDER_SITE_OTHER): Payer: Medicare Other | Admitting: Ophthalmology

## 2023-07-19 DIAGNOSIS — Z961 Presence of intraocular lens: Secondary | ICD-10-CM

## 2023-07-19 DIAGNOSIS — H35033 Hypertensive retinopathy, bilateral: Secondary | ICD-10-CM

## 2023-07-19 DIAGNOSIS — H4311 Vitreous hemorrhage, right eye: Secondary | ICD-10-CM

## 2023-07-19 DIAGNOSIS — I10 Essential (primary) hypertension: Secondary | ICD-10-CM

## 2023-08-16 ENCOUNTER — Ambulatory Visit: Payer: Medicare Other | Admitting: Family Medicine

## 2023-08-16 ENCOUNTER — Ambulatory Visit: Payer: Medicare Other | Admitting: Neurology

## 2023-12-04 IMAGING — CT CT HEAD W/O CM
4 series · 15 of 47 positions shown, 17 images · non-contrast
Comparison: CT head 04/05/2021.  CT cervical spine 04/03/2021.

CLINICAL DATA: Status post fall.  Head and neck trauma.



[Series 3: head wo · axial · 0.39mm/px · z∈[-136,-16]mm · 7 of 32 slices shown, 9 images]
[im 4/32  brain]
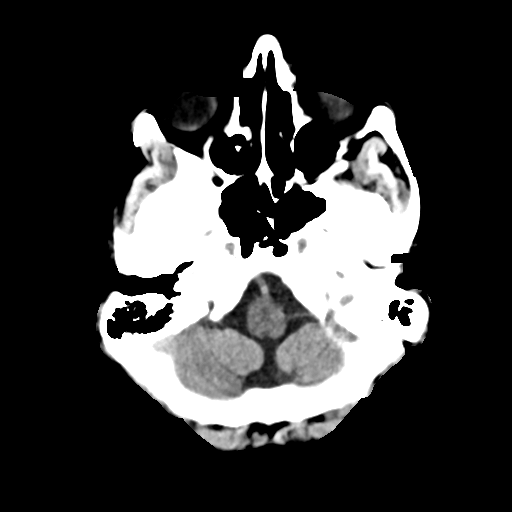
[im 4/32  bone]
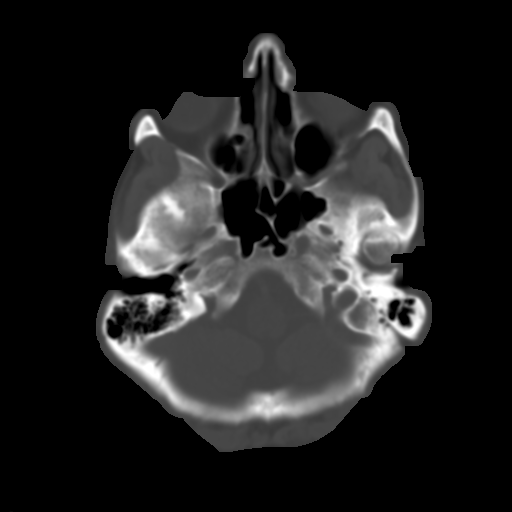
[im 8/32  brain]
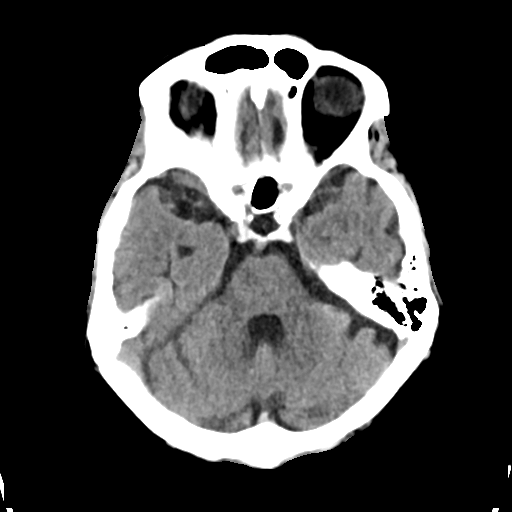
[im 12/32  brain]
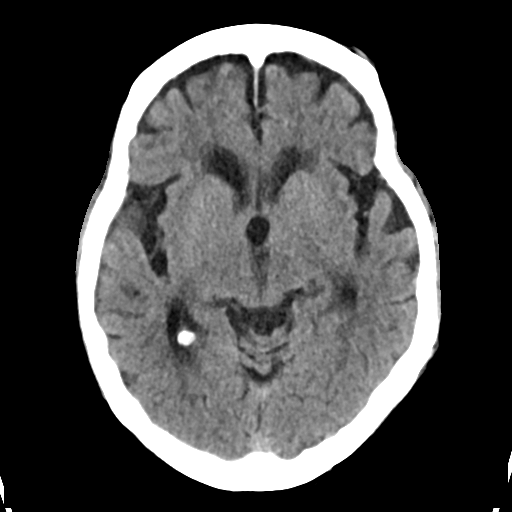
[im 16/32  brain]
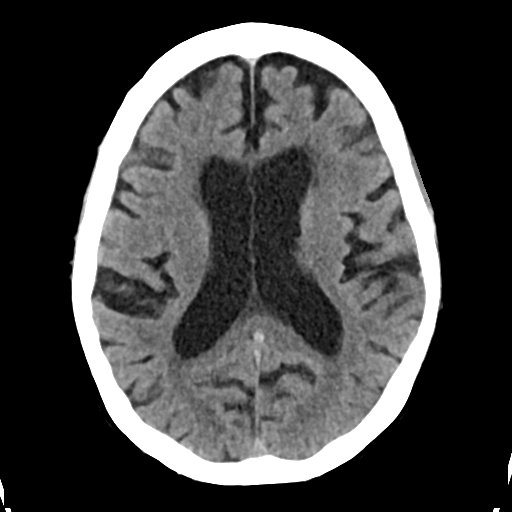
[im 20/32  brain]
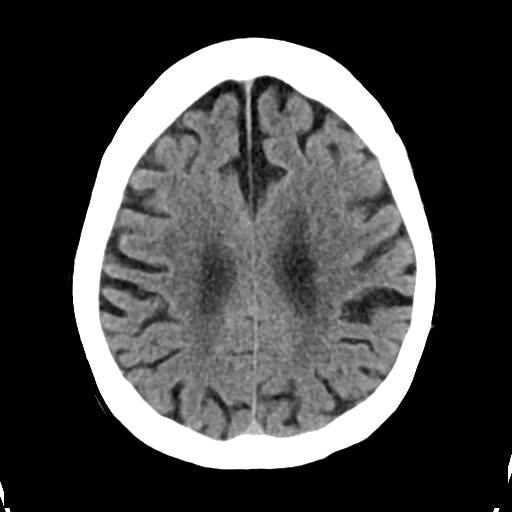
[im 20/32  bone]
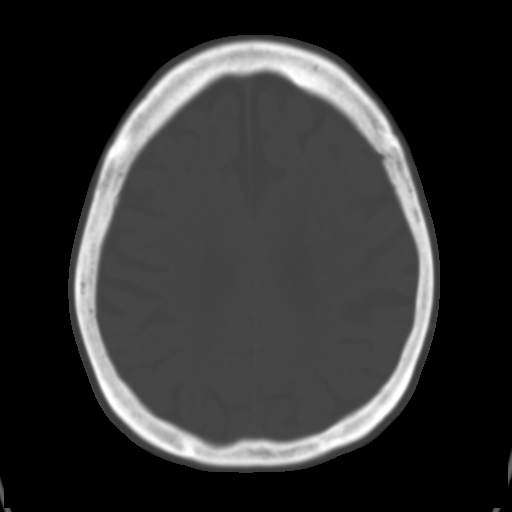
[im 24/32  brain]
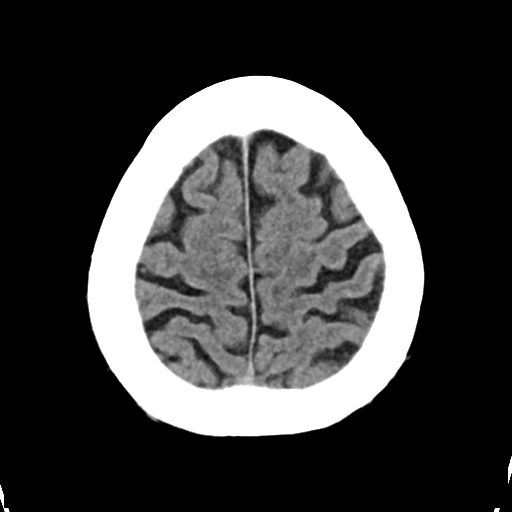
[im 28/32  brain]
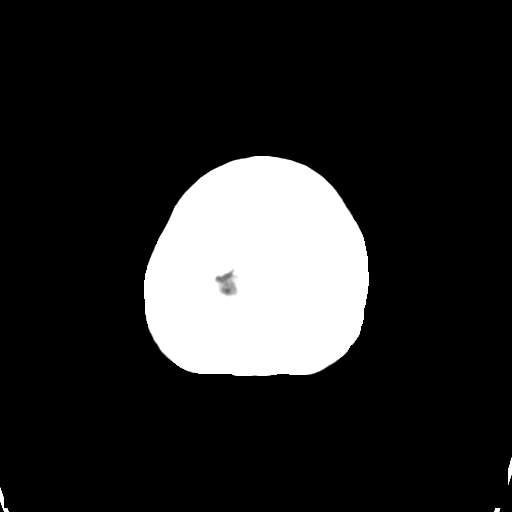

[Series 4: head bone · axial · 0.39mm/px · z∈[-138,-122]mm · 2 of 78 slices shown]
[im 8/78  bone]
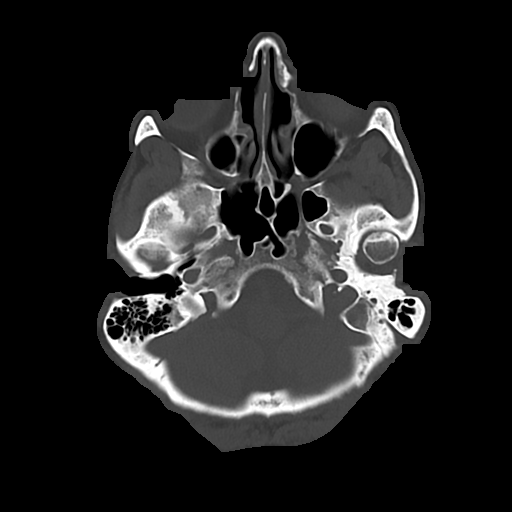
[im 16/78  bone]
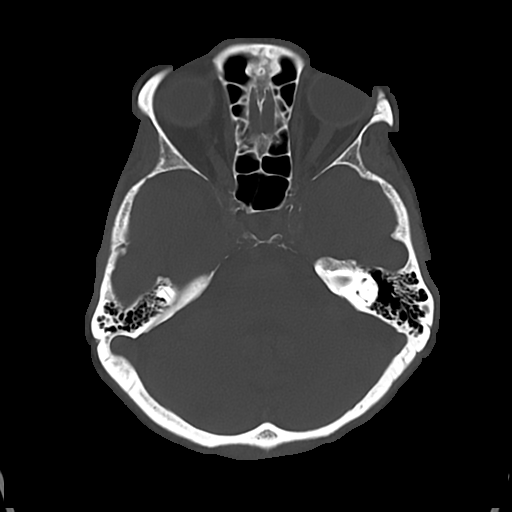

[Series 5: cor soft · coronal · 0.29mm/px · 3 of 64 slices shown]
[im 22/64  brain]
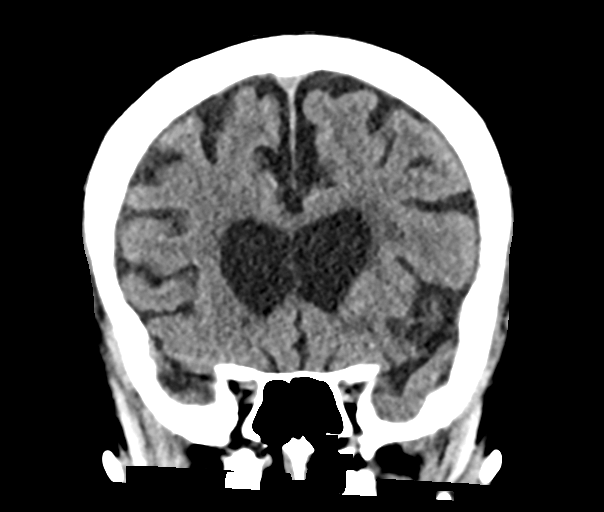
[im 29/64  brain]
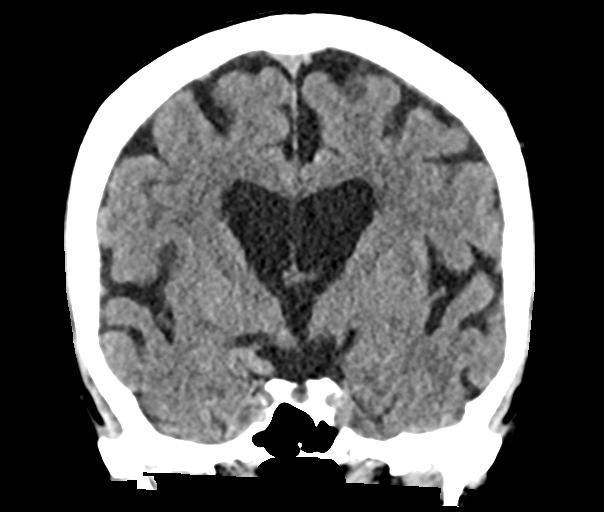
[im 36/64  brain]
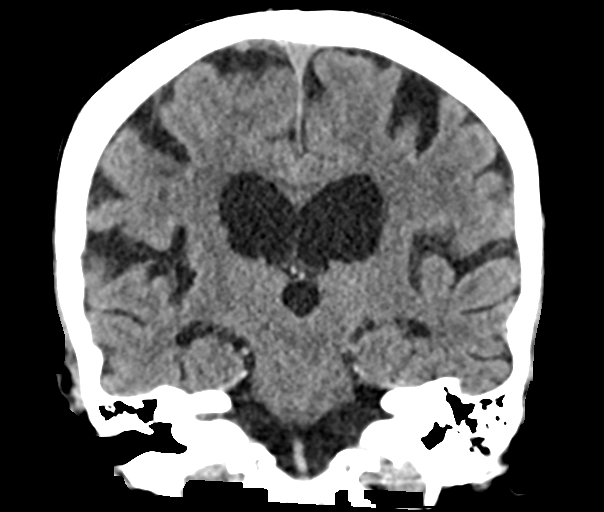

[Series 6: sag soft · sagittal · 0.29mm/px · 3 of 56 slices shown]
[im 19/56  brain]
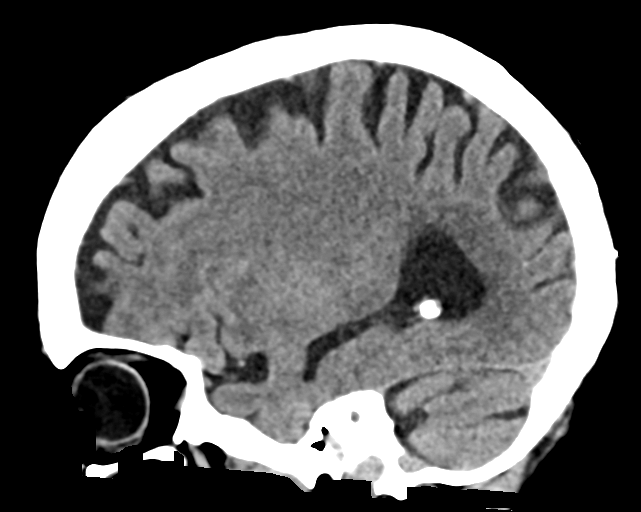
[im 28/56  brain]
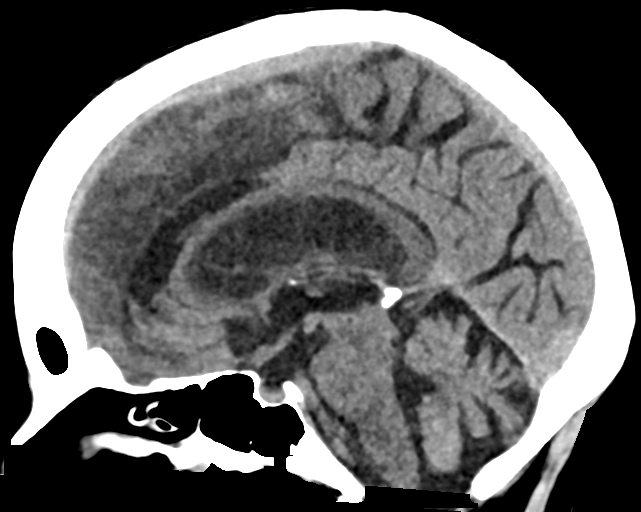
[im 37/56  brain]
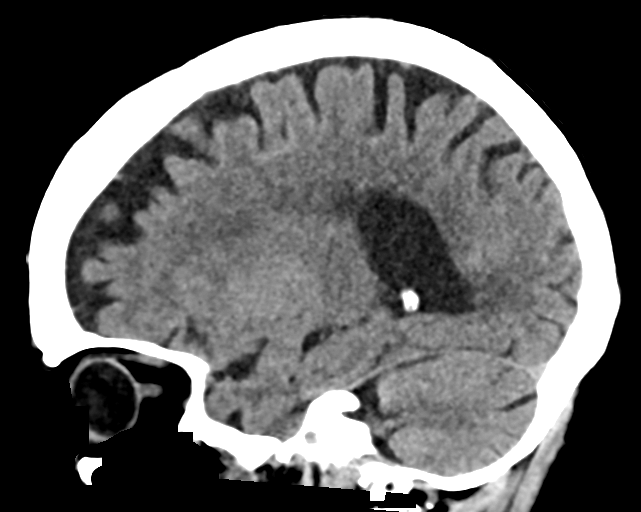

[15 of 47 positions shown; findings below may reference images not displayed]

FINDINGS: CT HEAD FINDINGS

Brain: There is no evidence of acute intracranial hemorrhage, mass
lesion, brain edema or extra-axial fluid collection. Stable mild
atrophy with prominence of the ventricles and subarachnoid spaces.
Stable encephalomalacia inferiorly in the left frontal lobe and mild
chronic periventricular white matter disease, likely due to chronic
small vessel ischemic changes. There is no CT evidence of acute
cortical infarction.

Vascular: Intracranial vascular calcifications. No hyperdense vessel
identified.

Skull: Negative for fracture or focal lesion.

Sinuses/Orbits: New right maxillary sinus mucosal thickening with
possible small air-level. The additional visualized paranasal
sinuses, mastoid air cells and middle ears are clear.

Other: No facial fractures are identified.

CT CERVICAL SPINE FINDINGS

Despite efforts by the technologist and patient, mild motion
artifact is present on today's exam and could not be eliminated.
This reduces exam sensitivity and specificity.

Alignment: Stable and near anatomic. Minimal degenerative
anterolisthesis at C4-5.

Skull base and vertebrae: No evidence of acute cervical spine
fracture or traumatic subluxation. Probable chronic interbody and
interfacetal ankylosis at C3-4.

Soft tissues and spinal canal: No prevertebral fluid or swelling. No
visible canal hematoma.

Disc levels: Stable relatively mild multilevel spondylosis with disc
bulging, uncinate spurring and facet hypertrophy. Stable mild
chronic foraminal narrowing at C5-6.

Upper chest: Stable mild scarring at both lung apices. Bilateral
carotid atherosclerosis.

Other: None.
IMPRESSION: 1. No acute intracranial or calvarial findings.
2. New right maxillary sinus mucosal thickening with possible small
air-level.
3. No evidence of acute cervical spine fracture, traumatic
subluxation or static signs of instability.
4. Mild spondylosis, without significant change.

## 2024-01-11 ENCOUNTER — Emergency Department (HOSPITAL_COMMUNITY)
Admission: EM | Admit: 2024-01-11 | Discharge: 2024-01-11 | Disposition: A | Discharge status: Home / Self Care | Source: Skilled Nursing Facility | Attending: Emergency Medicine | Admitting: Emergency Medicine

## 2024-01-11 ENCOUNTER — Other Ambulatory Visit: Payer: Self-pay

## 2024-01-11 ENCOUNTER — Other Ambulatory Visit (HOSPITAL_COMMUNITY): Payer: Self-pay

## 2024-01-11 DIAGNOSIS — I4891 Unspecified atrial fibrillation: Secondary | ICD-10-CM | POA: Insufficient documentation

## 2024-01-11 DIAGNOSIS — J449 Chronic obstructive pulmonary disease, unspecified: Secondary | ICD-10-CM | POA: Insufficient documentation

## 2024-01-11 DIAGNOSIS — Z7901 Long term (current) use of anticoagulants: Secondary | ICD-10-CM | POA: Insufficient documentation

## 2024-01-11 DIAGNOSIS — K0889 Other specified disorders of teeth and supporting structures: Secondary | ICD-10-CM | POA: Diagnosis present

## 2024-01-11 DIAGNOSIS — I5030 Unspecified diastolic (congestive) heart failure: Secondary | ICD-10-CM | POA: Insufficient documentation

## 2024-01-11 DIAGNOSIS — I110 Hypertensive heart disease with heart failure: Secondary | ICD-10-CM | POA: Diagnosis not present

## 2024-01-11 DIAGNOSIS — K029 Dental caries, unspecified: Secondary | ICD-10-CM | POA: Diagnosis not present

## 2024-01-11 MED ORDER — IBUPROFEN 200 MG PO TABS: 400.0000 mg | ORAL_TABLET | Freq: Once | ORAL | Status: DC

## 2024-01-11 MED ORDER — ACETAMINOPHEN 325 MG PO TABS
650.0000 mg | ORAL_TABLET | Freq: Once | ORAL | Status: AC
  Administered 2024-01-11: 650 mg via ORAL
  Filled 2024-01-11: qty 2

## 2024-01-11 MED ORDER — AMOXICILLIN 500 MG PO CAPS
1000.0000 mg | ORAL_CAPSULE | Freq: Once | ORAL | Status: AC
Start: 2024-01-11 — End: 2024-01-11
  Administered 2024-01-11: 1000 mg via ORAL
  Filled 2024-01-11: qty 2

## 2024-01-11 MED ORDER — AMOXICILLIN 500 MG PO CAPS
1000.0000 mg | ORAL_CAPSULE | Freq: Two times a day (BID) | ORAL | 0 refills | Status: AC
  Filled 2024-01-11: qty 40, 10d supply, fill #0

## 2024-01-11 NOTE — Discharge Instructions (Addendum)
 You will need to see a dentist about your tooth.  Call in the morning for an appointment as soon as possible.  In the meantime, you may take acetaminophen  as needed for pain.  Do not take ibuprofen  because you are on a blood thinner and taking ibuprofen  can cause severe bleeding.  I have provided a page of dental resources if you do not have your own dentist.

## 2024-01-11 NOTE — ED Provider Notes (Signed)
 Lindsay Rush EMERGENCY DEPARTMENT AT [facility] Provider Note   CSN: [id number] Arrival date & time: 01/11/24  0146     Patient presents with: Dental Pain   Lindsay Rush is a 89 y.o. female.   The history is provided by the patient.  Dental Pain  She has history of hypertension, hyperlipidemia, diastolic heart failure, COPD, atrial fibrillation anticoagulated on rivaroxaban  and comes in because of a toothache.  She complains of pain in the left upper tooth for the last 3 days.  She has not taken anything for pain.    Prior to Admission medications   Medication Sig Start Date End Date Taking? Authorizing Provider  ACETAMINOPHEN  EXTRA STRENGTH 500 MG tablet Take 500 mg by mouth 3 (three) times daily. 07/28/21   [provider]  Cholecalciferol (VITAMIN D3) 1000 units CAPS Take by mouth.    [provider]  diclofenac  Sodium (VOLTAREN ) 1 % GEL Apply 4 g topically 4 (four) times daily. 10/29/21   Emil Share, DO  digoxin  (LANOXIN ) 0.125 MG tablet Take 1 tablet (0.125 mg total) by mouth daily. 04/08/21 12/17/21  Shona Terry SAILOR, DO  docusate sodium  (COLACE) 50 MG capsule Take 50 mg by mouth daily.    [provider]  donepezil  (ARICEPT ) 5 MG tablet Take 1 tablet (5 mg total) by mouth at bedtime. 06/10/23   Sater, Charlie LABOR, MD  escitalopram  (LEXAPRO ) 10 MG tablet Take 1 tablet (10 mg total) by mouth daily. 06/10/23   Sater, Charlie LABOR, MD  ezetimibe  (ZETIA ) 10 MG tablet Take 10 mg by mouth at bedtime.    [provider]  famotidine (PEPCID) 40 MG tablet Take 40 mg by mouth daily.    [provider]  Fluticasone -Umeclidin-Vilant (TRELEGY ELLIPTA) 200-62.5-25 MCG/ACT AEPB Inhale 1 puff into the lungs daily.    [provider]  furosemide  (LASIX ) 40 MG tablet Take 1 tablet (40 mg total) by mouth daily. 04/07/21 12/17/21  Shona Terry SAILOR, DO  lidocaine  4 % Place 1 patch onto the skin daily.    [provider]  loperamide (IMODIUM  A-D) 2 MG tablet Take 2 mg by mouth 4 (four) times daily as needed for diarrhea or loose stools.    [provider]  loratadine (CLARITIN) 10 MG tablet Take 10 mg by mouth daily.    [provider]  polyethylene glycol powder (GLYCOLAX/MIRALAX) 17 GM/SCOOP powder Take 17 g by mouth daily.    [provider]  potassium chloride  (KLOR-CON ) 20 MEQ packet Take 20 mEq by mouth daily. 04/08/21 12/17/21  Shona Terry SAILOR, DO  Rivaroxaban  (XARELTO ) 15 MG TABS tablet Take 15 mg by mouth daily.    [provider]  thyroid  (ARMOUR) 30 MG tablet Take 30 mg by mouth daily before breakfast.    [provider]  traZODone  (DESYREL ) 50 MG tablet Take 50 mg by mouth at bedtime.    [provider]    Allergies: Codeine    Review of Systems  All other systems reviewed and are negative.   Updated Vital Signs BP (!) 163/89   Pulse 66   Temp (!) 97.5 F (36.4 C) (Oral)   Resp 16   SpO2 97%   Physical Exam Vitals and nursing note reviewed.   89 year old female, resting comfortably and in no acute distress. Vital signs are significant for elevated blood pressure. Oxygen saturation is 97%, which is normal. Head is normocephalic and atraumatic. PERRLA, EOMI. Oropharynx shows multiple missing teeth on  the left.  There is a single molar present which appears to have caries and is tender to percussion - I believe it is tooth #15.  There is no gingival swelling, erythema, pallor. Neck is nontender and supple without adenopathy. Lungs are clear without rales, wheezes, or rhonchi. Chest is nontender. Heart has regular rate and rhythm without murmur. Neurologic: Awake and alert, moves all extremities equally.    Procedures   Medications Ordered in the ED - No data to display                                  Medical Decision Making  Pain due to dental caries in a patient who is on chronic anticoagulation.  I have ordered a dose of amoxicillin  and I am  discharging her with prescription for same.  She will need to see a dentist for definitive management of her tooth.     Final diagnoses:  Pain due to dental caries  Chronic anticoagulation    ED Discharge Orders     None          Raford Lenis, MD 01/11/24 [phone]

## 2024-01-11 NOTE — ED Triage Notes (Signed)
 BIBA from [facility] c/o dental pain x3 days, headache that started 2-3 hours ago, 10/10 pain, endorses difficulty to chewing and swallowing.  Bp 142/94

## 2024-01-11 NOTE — ED Notes (Signed)
 [facility] called to set up tranport

## 2024-01-17 ENCOUNTER — Encounter: Payer: Self-pay | Admitting: Neurology

## 2024-01-17 ENCOUNTER — Ambulatory Visit: Payer: Medicare Other | Admitting: Neurology

## 2024-01-17 VITALS — BP 140/83 | HR 67 | Ht 62.0 in | Wt 104.0 lb

## 2024-01-17 DIAGNOSIS — F418 Other specified anxiety disorders: Secondary | ICD-10-CM

## 2024-01-17 DIAGNOSIS — F03A11 Unspecified dementia, mild, with agitation: Secondary | ICD-10-CM | POA: Diagnosis not present

## 2024-01-17 DIAGNOSIS — I482 Chronic atrial fibrillation, unspecified: Secondary | ICD-10-CM | POA: Diagnosis not present

## 2024-01-17 MED ORDER — DONEPEZIL HCL 10 MG PO TABS
10.0000 mg | ORAL_TABLET | Freq: Every day | ORAL | 3 refills | Status: AC
Start: 2024-01-17 — End: ?

## 2024-01-17 NOTE — Progress Notes (Signed)
 [facility]  PATIENT: Lindsay Rush DOB: 10/23/1934  REFERRING DOCTOR OR PCP:    Lindsay Meissner, NP SOURCE: Patient, notes from prior neurology visits, imaging and lab results, cognitive testing  _________________________________   HISTORICAL  CHIEF COMPLAINT:  Chief Complaint  Patient presents with   Follow-up    Pt in room 10. Daughter in room. Here for dementia follow up. Daughter reports reports being more forgetful. MOCA:18    HISTORY OF PRESENT ILLNESS:  Lindsay Rush is a 89 year old woman with dementia.  Update 01/16/2086 Since the last visit, ATN profile was performed --- reduced AB 40/42  ratio and elevated [id number] c/w AD.   She is on donepezil  5 mg and we discussed stepping up to 10 mg.  She feels the same on the medication.   MoCA today was 18/30 - MCI/Mild  Her daughter feels she is a little more forgetful.  She is at [facility] so meals are covered and some supervision.  She calls daughter frequently because she is confused and not sure what to do.   She is sleeping well at night and sometimes takes a short nap during th day..   She feels mood isok - no irritability now, some anxiety but not more severe spells..     She feels that she is not where she is supposed to be (geographically) and this disorientation upsets her.    She might think [place] is [place] alone in [place]  as a recent example.    Spells neer occur when with thes.   There is no specific time  She is in Assisted [facility] ( [facility] [address]) and has lived there x 2-3 years.    She does not drive or shop.  She has a single room and has not changed rooms.       01/16/2086   11:33 AM 06/09/2085   11:51 AM 01/19/2086    1:20 PM 06/11/2085    3:15 PM  Montreal Cognitive Assessment   Visuospatial/ Executive (0/5) 3 2 5 5   Naming (0/3) 2 3 3 3   Attention: Read list of digits (0/2) 2 2 2 2   Attention: Read list of letters (0/1) 1 0 1 1  Attention: Serial 7 subtraction starting  at 100 (0/3) 1 1 1 1   Language: Repeat phrase (0/2) 2 2 2 2   Language : Fluency (0/1) 0 0 0 1  Abstraction (0/2) 1 2 2 2   Delayed Recall (0/5) 1 0 0 0  Orientation (0/6) 5 3 4 6   Total 18 15 20 23   Adjusted Score (based on education)  16        06/11/2085    1:50 PM  MMSE - Mini Mental State Exam  Orientation to time 4  Orientation to Place 2  Registration 3  Attention/ Calculation 1  Recall 3  Language- name 2 objects 2  Language- repeat 1  Language- follow 3 step command 3  Language- read & follow direction 1  Write a sentence 1  Copy design 0  Total score 21    Imaging: CT scan of the head shows mild generalized cortical atrophy, and chronic microvascular ischemic changes.  No acute findings.   REVIEW OF SYSTEMS: Constitutional: No fevers, chills, sweats, or change in appetite Eyes: No visual changes, double vision, eye pain Ear, nose and throat: No hearing loss, ear pain, nasal congestion, sore throat Cardiovascular: No chest pain, palpitations Respiratory:  No shortness of breath at rest or with exertion.   No  wheezes GastrointestinaI: No nausea, vomiting, diarrhea, abdominal pain, fecal incontinence Genitourinary:  No dysuria, urinary retention or frequency.  No nocturia. Musculoskeletal:  No neck pain, back pain Integumentary: No rash, pruritus, skin lesions Neurological: as above Psychiatric: No depression at this time.  No anxiety Endocrine: No palpitations, diaphoresis, change in appetite, change in weigh or increased thirst Hematologic/Lymphatic:  No anemia, purpura, petechiae. Allergic/Immunologic: No itchy/runny eyes, nasal congestion, recent allergic reactions, rashes  ALLERGIES: Allergies  Allergen Reactions   Codeine     HOME MEDICATIONS:  Current Outpatient Medications:    ACETAMINOPHEN  EXTRA STRENGTH 500 MG tablet, Take 500 mg by mouth 3 (three) times daily., Disp: , Rfl:    amoxicillin  (AMOXIL ) 500 MG capsule, Take 2 capsules (1,000 mg total)  by mouth 2 (two) times daily for 10 days., Disp: 40 capsule, Rfl: 0   Cholecalciferol (VITAMIN D3) 1000 units CAPS, Take by mouth., Disp: , Rfl:    diclofenac  Sodium (VOLTAREN ) 1 % GEL, Apply 4 g topically 4 (four) times daily., Disp: 100 g, Rfl: 0   digoxin  (LANOXIN ) 0.125 MG tablet, Take 1 tablet (0.125 mg total) by mouth daily., Disp: 30 tablet, Rfl: 0   docusate sodium  (COLACE) 50 MG capsule, Take 50 mg by mouth daily., Disp: , Rfl:    escitalopram  (LEXAPRO ) 10 MG tablet, Take 1 tablet (10 mg total) by mouth daily., Disp: 30 tablet, Rfl: 11   ezetimibe  (ZETIA ) 10 MG tablet, Take 10 mg by mouth at bedtime., Disp: , Rfl:    famotidine (PEPCID) 40 MG tablet, Take 40 mg by mouth daily., Disp: , Rfl:    Fluticasone -Umeclidin-Vilant (TRELEGY ELLIPTA) 200-62.5-25 MCG/ACT AEPB, Inhale 1 puff into the lungs daily., Disp: , Rfl:    furosemide  (LASIX ) 40 MG tablet, Take 1 tablet (40 mg total) by mouth daily., Disp: 30 tablet, Rfl: 0   lidocaine  4 %, Place 1 patch onto the skin daily., Disp: , Rfl:    loperamide (IMODIUM A-D) 2 MG tablet, Take 2 mg by mouth 4 (four) times daily as needed for diarrhea or loose stools., Disp: , Rfl:    loratadine (CLARITIN) 10 MG tablet, Take 10 mg by mouth daily., Disp: , Rfl:    polyethylene glycol powder (GLYCOLAX/MIRALAX) 17 GM/SCOOP powder, Take 17 g by mouth daily., Disp: , Rfl:    potassium chloride  (KLOR-CON ) 20 MEQ packet, Take 20 mEq by mouth daily., Disp: 30 packet, Rfl: 0   Rivaroxaban  (XARELTO ) 15 MG TABS tablet, Take 15 mg by mouth daily., Disp: , Rfl:    thyroid  (ARMOUR) 30 MG tablet, Take 30 mg by mouth daily before breakfast., Disp: , Rfl:    traZODone  (DESYREL ) 50 MG tablet, Take 50 mg by mouth at bedtime., Disp: , Rfl:    donepezil  (ARICEPT ) 10 MG tablet, Take 1 tablet (10 mg total) by mouth at bedtime., Disp: 90 tablet, Rfl: 3  PAST MEDICAL HISTORY: Past Medical History:  Diagnosis Date   Atrial fibrillation (HCC)    Chronic diastolic CHF  (congestive heart failure) (HCC)    Chronic hypotension    COPD (chronic obstructive pulmonary disease) (HCC)    Dementia (HCC)    Fall    Hyperlipidemia    Hypertension    Hypothyroidism    Insomnia    MCI (mild cognitive impairment)    Thyrotoxicosis     PAST SURGICAL HISTORY: Past Surgical History:  Procedure Laterality Date   ABDOMINAL HYSTERECTOMY     APPENDECTOMY     CHOLECYSTECTOMY     left  knee replacement  12/2020    FAMILY HISTORY: Family History  Problem Relation Age of Onset   Heart attack Father     SOCIAL HISTORY: Social History   Socioeconomic History   Marital status: Married    Spouse name: Not on file   Number of children: 2   Years of education: Not on file   Highest education level: High school graduate  Occupational History    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  Tobacco Use   Smoking status: Never   Smokeless tobacco: Never  Vaping Use   Vaping status: Never Used  Substance and Sexual Activity   Alcohol use: Never   Drug use: Never    Comment: unable to answer   Sexual activity: Not Currently  Other Topics Concern   Not on file  Social History Narrative   06/11/21 resides at [facility]   Social Drivers of Health   Financial Resource Strain: Not on file  Food Insecurity: Not on file  Transportation Needs: Not on file  Physical Activity: Not on file  Stress: Not on file  Social Connections: Not on file  Intimate Partner Violence: Not on file       PHYSICAL EXAM  Vitals:   01/17/24 1129 01/17/24 1147  BP: (!) 155/94 (!) 140/83  Pulse: 67   Weight: 104 lb (47.2 kg)   Height: 5' 2 (1.575 m)     Body mass index is 19.02 kg/m.   General: The patient is well-developed and well-nourished and in no acute distress  HEENT:  Head is [place]/AT.  Sclera are anicteric.    Neck: No carotid bruits are noted.  The neck is nontender.  Cardiovascular: The heart has an irregular rate and rhythm with a normal S1 and S2. There  were no murmurs, gallops or rubs.    Skin: Extremities are without rash or  edema.  Musculoskeletal:  Back is nontender  Neurologic Exam  Mental status: The patient is alert and oriented x 3 at the time of the examination. The patient has reduced short-term memory and reduced attention span and concentration ability.   Speech is normal.  Cranial nerves: Extraocular movements are full. SABRA Law fields are full.  Facial symmetry is present. There is good facial sensation to soft touch bilaterally.Facial strength is normal.  Trapezius and sternocleidomastoid strength is normal. No dysarthria is noted.  The tongue is midline, and the patient has symmetric elevation of the soft palate. No obvious hearing deficits are noted.  Motor:  Muscle bulk is normal.   Tone is normal. Strength is  5 / 5 in all 4 extremities.   Sensory: Sensory testing is intact to pinprick, soft touch and vibration sensation in all 4 extremities.  Coordination: Cerebellar testing reveals good finger-nose-finger and heel-to-shin bilaterally.  Gait and station: Station is normal.  The gait has a mildly reduced stride and tandem is poor.  She has retropulsion with even minimal disturbance of the posture.  Romberg is negative.   Reflexes: Deep tendon reflexes are symmetric and normal bilaterally.   Plantar responses are flexor.    DIAGNOSTIC DATA (LABS, IMAGING, TESTING) - I reviewed patient records, labs, notes, testing and imaging myself where available.  Lab Results  Component Value Date   WBC 9.6 02/14/2022   HGB 15.0 02/14/2022   HCT 43.6 02/14/2022   MCV 96.5 02/14/2022   PLT 217 02/14/2022      Component Value Date/Time   NA 138 02/14/2022 1718   K 4.1  02/14/2022 1718   CL 103 02/14/2022 1718   CO2 24 02/14/2022 1718   GLUCOSE 102 (H) 02/14/2022 1718   BUN 11 02/14/2022 1718   CREATININE 0.95 02/14/2022 1718   CALCIUM 9.7 02/14/2022 1718   PROT 7.7 02/14/2022 1718   ALBUMIN 4.3 02/14/2022 1718   AST  17 02/14/2022 1718   ALT 13 02/14/2022 1718   ALKPHOS 50 02/14/2022 1718   BILITOT 0.9 02/14/2022 1718   GFRNONAA 59 (L) 02/14/2022 1718   No results found for: CHOL, HDL, LDLCALC, LDLDIRECT, TRIG, CHOLHDL No results found for: YHAJ8R Lab Results  Component Value Date   VITAMINB12 229 (L) 06/10/2023   Lab Results  Component Value Date   TSH 0.506 06/10/2023       ASSESSMENT AND PLAN  Mild dementia with agitation, unspecified dementia type (HCC)  Chronic atrial fibrillation (HCC)  Depression with anxiety    Increase donepezil  from 5 mg to 10 mg nightly.  We briefly discussed other medications.  I would consider adding memantine in the future.   For anxiety continue escitalopram   10 mg.   If anxiety worsens or if she becomes agitated, consider adding BuSpar or hydroxyzine Stay physically, socially and mentally active.\ Return in 12 months or sooner if there are new or worsening neurologic symptoms.  This visit is part of a comprehensive longitudinal care medical relationship regarding the patients primary diagnosis of dementia and related concerns.   Kamar Callender A. Vear, MD, [facility] 01/16/2086, 12:47 PM Certified in Neurology, Clinical Neurophysiology, Sleep Medicine and Neuroimaging  [facility] [address], [address] [place], [place] [address] [phone]

## 2024-01-20 ENCOUNTER — Other Ambulatory Visit (HOSPITAL_COMMUNITY): Payer: Self-pay

## 2024-01-27 ENCOUNTER — Other Ambulatory Visit: Payer: Self-pay

## 2024-01-27 ENCOUNTER — Emergency Department (HOSPITAL_COMMUNITY)

## 2024-01-27 ENCOUNTER — Inpatient Hospital Stay (HOSPITAL_COMMUNITY)
Admission: EM | Admit: 2024-01-27 | Discharge: 2024-01-28 | DRG: 281 | Disposition: A | Discharge status: Home / Self Care | Attending: Hospitalist | Admitting: Hospitalist

## 2024-01-27 ENCOUNTER — Encounter (HOSPITAL_COMMUNITY): Payer: Self-pay

## 2024-01-27 DIAGNOSIS — I2510 Atherosclerotic heart disease of native coronary artery without angina pectoris: Secondary | ICD-10-CM | POA: Diagnosis present

## 2024-01-27 DIAGNOSIS — Z96652 Presence of left artificial knee joint: Secondary | ICD-10-CM | POA: Diagnosis present

## 2024-01-27 DIAGNOSIS — Z885 Allergy status to narcotic agent status: Secondary | ICD-10-CM

## 2024-01-27 DIAGNOSIS — F03A0 Unspecified dementia, mild, without behavioral disturbance, psychotic disturbance, mood disturbance, and anxiety: Secondary | ICD-10-CM | POA: Diagnosis present

## 2024-01-27 DIAGNOSIS — Z90710 Acquired absence of both cervix and uterus: Secondary | ICD-10-CM

## 2024-01-27 DIAGNOSIS — N1831 Chronic kidney disease, stage 3a: Secondary | ICD-10-CM | POA: Diagnosis present

## 2024-01-27 DIAGNOSIS — I5032 Chronic diastolic (congestive) heart failure: Secondary | ICD-10-CM | POA: Diagnosis present

## 2024-01-27 DIAGNOSIS — Z79899 Other long term (current) drug therapy: Secondary | ICD-10-CM

## 2024-01-27 DIAGNOSIS — Z9049 Acquired absence of other specified parts of digestive tract: Secondary | ICD-10-CM

## 2024-01-27 DIAGNOSIS — I130 Hypertensive heart and chronic kidney disease with heart failure and stage 1 through stage 4 chronic kidney disease, or unspecified chronic kidney disease: Secondary | ICD-10-CM | POA: Diagnosis present

## 2024-01-27 DIAGNOSIS — I4821 Permanent atrial fibrillation: Secondary | ICD-10-CM | POA: Diagnosis present

## 2024-01-27 DIAGNOSIS — Z1152 Encounter for screening for COVID-19: Secondary | ICD-10-CM

## 2024-01-27 DIAGNOSIS — J449 Chronic obstructive pulmonary disease, unspecified: Secondary | ICD-10-CM | POA: Diagnosis present

## 2024-01-27 DIAGNOSIS — Z7901 Long term (current) use of anticoagulants: Secondary | ICD-10-CM

## 2024-01-27 DIAGNOSIS — I214 Non-ST elevation (NSTEMI) myocardial infarction: Secondary | ICD-10-CM | POA: Diagnosis not present

## 2024-01-27 DIAGNOSIS — Z8249 Family history of ischemic heart disease and other diseases of the circulatory system: Secondary | ICD-10-CM

## 2024-01-27 DIAGNOSIS — E7800 Pure hypercholesterolemia, unspecified: Secondary | ICD-10-CM | POA: Diagnosis present

## 2024-01-27 DIAGNOSIS — E039 Hypothyroidism, unspecified: Secondary | ICD-10-CM | POA: Diagnosis present

## 2024-01-27 LAB — CBC WITH DIFFERENTIAL/PLATELET
Abs Immature Granulocytes: 0.07 K/uL (ref 0.00–0.07)
Basophils Absolute: 0.1 K/uL (ref 0.0–0.1)
Basophils Relative: 0 %
Eosinophils Absolute: 0 K/uL (ref 0.0–0.5)
Eosinophils Relative: 0 %
HCT: 41.2 % (ref 36.0–46.0)
Hemoglobin: 13.5 g/dL (ref 12.0–15.0)
Immature Granulocytes: 1 %
Lymphocytes Relative: 23 %
Lymphs Abs: 3.5 K/uL (ref 0.7–4.0)
MCH: 32.3 pg (ref 26.0–34.0)
MCHC: 32.8 g/dL (ref 30.0–36.0)
MCV: 98.6 fL (ref 80.0–100.0)
Monocytes Absolute: 1 K/uL (ref 0.1–1.0)
Monocytes Relative: 7 %
Neutro Abs: 10.2 K/uL — ABNORMAL HIGH (ref 1.7–7.7)
Neutrophils Relative %: 69 %
Platelets: 224 K/uL (ref 150–400)
RBC: 4.18 MIL/uL (ref 3.87–5.11)
RDW: 12.9 % (ref 11.5–15.5)
WBC: 14.8 K/uL — ABNORMAL HIGH (ref 4.0–10.5)
nRBC: 0 % (ref 0.0–0.2)

## 2024-01-27 LAB — RESP PANEL BY RT-PCR (RSV, FLU A&B, COVID)  RVPGX2
Influenza A by PCR: NEGATIVE
Influenza B by PCR: NEGATIVE
Resp Syncytial Virus by PCR: NEGATIVE
SARS Coronavirus 2 by RT PCR: NEGATIVE

## 2024-01-27 NOTE — ED Triage Notes (Signed)
 Cough x 3 days and now some associated of chest pain when she coughs. Muscular in nature.

## 2024-01-28 ENCOUNTER — Inpatient Hospital Stay (HOSPITAL_COMMUNITY)

## 2024-01-28 ENCOUNTER — Other Ambulatory Visit (HOSPITAL_COMMUNITY): Payer: Self-pay

## 2024-01-28 ENCOUNTER — Emergency Department (HOSPITAL_COMMUNITY)

## 2024-01-28 DIAGNOSIS — Z1152 Encounter for screening for COVID-19: Secondary | ICD-10-CM | POA: Diagnosis not present

## 2024-01-28 DIAGNOSIS — J449 Chronic obstructive pulmonary disease, unspecified: Secondary | ICD-10-CM | POA: Diagnosis present

## 2024-01-28 DIAGNOSIS — I2510 Atherosclerotic heart disease of native coronary artery without angina pectoris: Secondary | ICD-10-CM | POA: Diagnosis present

## 2024-01-28 DIAGNOSIS — N1831 Chronic kidney disease, stage 3a: Secondary | ICD-10-CM

## 2024-01-28 DIAGNOSIS — I4821 Permanent atrial fibrillation: Secondary | ICD-10-CM | POA: Diagnosis present

## 2024-01-28 DIAGNOSIS — I214 Non-ST elevation (NSTEMI) myocardial infarction: Principal | ICD-10-CM

## 2024-01-28 DIAGNOSIS — I10 Essential (primary) hypertension: Secondary | ICD-10-CM

## 2024-01-28 DIAGNOSIS — Z7901 Long term (current) use of anticoagulants: Secondary | ICD-10-CM | POA: Diagnosis not present

## 2024-01-28 DIAGNOSIS — Z90710 Acquired absence of both cervix and uterus: Secondary | ICD-10-CM | POA: Diagnosis not present

## 2024-01-28 DIAGNOSIS — Z96652 Presence of left artificial knee joint: Secondary | ICD-10-CM | POA: Diagnosis present

## 2024-01-28 DIAGNOSIS — E039 Hypothyroidism, unspecified: Secondary | ICD-10-CM | POA: Diagnosis present

## 2024-01-28 DIAGNOSIS — E7800 Pure hypercholesterolemia, unspecified: Secondary | ICD-10-CM | POA: Diagnosis present

## 2024-01-28 DIAGNOSIS — Z885 Allergy status to narcotic agent status: Secondary | ICD-10-CM | POA: Diagnosis not present

## 2024-01-28 DIAGNOSIS — I130 Hypertensive heart and chronic kidney disease with heart failure and stage 1 through stage 4 chronic kidney disease, or unspecified chronic kidney disease: Secondary | ICD-10-CM | POA: Diagnosis present

## 2024-01-28 DIAGNOSIS — Z9049 Acquired absence of other specified parts of digestive tract: Secondary | ICD-10-CM | POA: Diagnosis not present

## 2024-01-28 DIAGNOSIS — Z8249 Family history of ischemic heart disease and other diseases of the circulatory system: Secondary | ICD-10-CM | POA: Diagnosis not present

## 2024-01-28 DIAGNOSIS — Z79899 Other long term (current) drug therapy: Secondary | ICD-10-CM | POA: Diagnosis not present

## 2024-01-28 DIAGNOSIS — I5032 Chronic diastolic (congestive) heart failure: Secondary | ICD-10-CM | POA: Diagnosis present

## 2024-01-28 DIAGNOSIS — F03A0 Unspecified dementia, mild, without behavioral disturbance, psychotic disturbance, mood disturbance, and anxiety: Secondary | ICD-10-CM | POA: Diagnosis present

## 2024-01-28 LAB — LIPID PANEL
Cholesterol: 140 mg/dL (ref 0–200)
HDL: 35 mg/dL — ABNORMAL LOW (ref >40)
LDL Cholesterol: 81 mg/dL (ref 0–99)
Total CHOL/HDL Ratio: 4 ratio
Triglycerides: 118 mg/dL (ref <150)
VLDL: 24 mg/dL (ref 0–40)

## 2024-01-28 LAB — BASIC METABOLIC PANEL WITH GFR
Anion gap: 11 (ref 5–15)
BUN: 18 mg/dL (ref 8–23)
CO2: 24 mmol/L (ref 22–32)
Calcium: 9.3 mg/dL (ref 8.9–10.3)
Chloride: 102 mmol/L (ref 98–111)
Creatinine, Ser: 1.15 mg/dL — ABNORMAL HIGH (ref 0.44–1.00)
GFR, Estimated: 46 mL/min — ABNORMAL LOW (ref >60)
Glucose, Bld: 111 mg/dL — ABNORMAL HIGH (ref 70–99)
Potassium: 4.3 mmol/L (ref 3.5–5.1)
Sodium: 137 mmol/L (ref 135–145)

## 2024-01-28 LAB — TROPONIN I (HIGH SENSITIVITY)
Troponin I (High Sensitivity): 602 ng/L (ref <18)
Troponin I (High Sensitivity): 659 ng/L (ref <18)

## 2024-01-28 LAB — DIGOXIN LEVEL: Digoxin Level: 0.6 ng/mL — ABNORMAL LOW (ref 0.8–2.0)

## 2024-01-28 LAB — HEPARIN LEVEL (UNFRACTIONATED): Heparin Unfractionated: >1.1 [IU]/mL — ABNORMAL HIGH (ref 0.30–0.70)

## 2024-01-28 SURGERY — LEFT HEART CATH AND CORONARY ANGIOGRAPHY
Anesthesia: LOCAL

## 2024-01-28 MED ORDER — SODIUM CHLORIDE 0.9 % IV SOLN
INTRAVENOUS | Status: DC
Start: 2024-01-28 — End: 2024-01-28

## 2024-01-28 MED ORDER — ISOSORBIDE MONONITRATE ER 30 MG PO TB24
30.0000 mg | ORAL_TABLET | Freq: Every day | ORAL | Status: DC
Start: 2024-01-28 — End: 2024-01-28
  Administered 2024-01-28: 30 mg via ORAL
  Filled 2024-01-28: qty 1

## 2024-01-28 MED ORDER — ISOSORBIDE MONONITRATE ER 30 MG PO TB24
30.0000 mg | ORAL_TABLET | Freq: Every day | ORAL | 0 refills | Status: AC
  Filled 2024-01-28: qty 90, 90d supply, fill #0

## 2024-01-28 MED ORDER — LACTATED RINGERS IV BOLUS
1000.0000 mL | Freq: Once | INTRAVENOUS | Status: AC
  Administered 2024-01-28: 1000 mL via INTRAVENOUS

## 2024-01-28 MED ORDER — ISOSORBIDE DINITRATE 10 MG PO TABS
5.0000 mg | ORAL_TABLET | Freq: Two times a day (BID) | ORAL | Status: DC
  Administered 2024-01-28: 5 mg via ORAL
  Filled 2024-01-28: qty 1

## 2024-01-28 MED ORDER — POLYETHYLENE GLYCOL 3350 17 G PO PACK: 17.0000 g | PACK | Freq: Every day | ORAL | Status: DC | PRN

## 2024-01-28 MED ORDER — ATORVASTATIN CALCIUM 10 MG PO TABS
10.0000 mg | ORAL_TABLET | Freq: Every day | ORAL | 0 refills | Status: DC
  Filled 2024-01-28: qty 90, 90d supply, fill #0

## 2024-01-28 MED ORDER — HEPARIN (PORCINE) 25000 UT/250ML-% IV SOLN
550.0000 [IU]/h | INTRAVENOUS | Status: DC
Start: 2024-01-28 — End: 2024-01-28

## 2024-01-28 MED ORDER — ASPIRIN 81 MG PO TBEC
81.0000 mg | DELAYED_RELEASE_TABLET | Freq: Every day | ORAL | Status: DC
  Administered 2024-01-28: 81 mg via ORAL
  Filled 2024-01-28: qty 1

## 2024-01-28 MED ORDER — NITROGLYCERIN 0.4 MG SL SUBL: 0.4000 mg | SUBLINGUAL_TABLET | SUBLINGUAL | Status: DC | PRN

## 2024-01-28 MED ORDER — IOHEXOL 350 MG/ML SOLN
50.0000 mL | Freq: Once | INTRAVENOUS | Status: AC | PRN
  Administered 2024-01-28: 50 mL via INTRAVENOUS

## 2024-01-28 MED ORDER — ATORVASTATIN CALCIUM 10 MG PO TABS: 10.0000 mg | ORAL_TABLET | Freq: Every day | ORAL | Status: DC

## 2024-01-28 MED ORDER — DIAZEPAM 5 MG/ML IJ SOLN
2.5000 mg | Freq: Once | INTRAMUSCULAR | Status: AC
  Administered 2024-01-28: 2.5 mg via INTRAVENOUS
  Filled 2024-01-28: qty 2

## 2024-01-28 MED ORDER — ASPIRIN 81 MG PO CHEW: 324.0000 mg | CHEWABLE_TABLET | Freq: Once | ORAL | Status: DC

## 2024-01-28 MED ORDER — PROCHLORPERAZINE EDISYLATE 10 MG/2ML IJ SOLN: 5.0000 mg | Freq: Four times a day (QID) | INTRAMUSCULAR | Status: DC | PRN

## 2024-01-28 MED ORDER — ACETAMINOPHEN 325 MG PO TABS: 650.0000 mg | ORAL_TABLET | Freq: Four times a day (QID) | ORAL | Status: DC | PRN

## 2024-01-28 MED ORDER — SODIUM CHLORIDE 0.9 % IV BOLUS
1000.0000 mL | Freq: Once | INTRAVENOUS | Status: AC
  Administered 2024-01-28: 1000 mL via INTRAVENOUS

## 2024-01-28 MED ORDER — MELATONIN 5 MG PO TABS: 5.0000 mg | ORAL_TABLET | Freq: Every evening | ORAL | Status: DC | PRN

## 2024-01-28 MED ORDER — METOPROLOL SUCCINATE ER 25 MG PO TB24
12.5000 mg | ORAL_TABLET | Freq: Every day | ORAL | 0 refills | Status: AC
  Filled 2024-01-28: qty 45, 90d supply, fill #0

## 2024-01-28 MED ORDER — ASPIRIN 81 MG PO TBEC
81.0000 mg | DELAYED_RELEASE_TABLET | Freq: Every day | ORAL | 12 refills | Status: AC
  Filled 2024-01-28: qty 30, 30d supply, fill #0

## 2024-01-28 MED ORDER — IOHEXOL 350 MG/ML SOLN: 75.0000 mL | Freq: Once | INTRAVENOUS | Status: DC | PRN

## 2024-01-28 MED ORDER — METOPROLOL SUCCINATE ER 25 MG PO TB24
12.5000 mg | ORAL_TABLET | Freq: Every day | ORAL | Status: DC
Start: 2024-01-28 — End: 2024-01-28
  Administered 2024-01-28: 12.5 mg via ORAL
  Filled 2024-01-28: qty 1

## 2024-01-28 NOTE — Consult Note (Signed)
 Cardiology Consult Note:   Patient ID: Lindsay Rush MRN: [id number]; DOB: 1935-03-18   Admission date: 01/27/2024  Primary Care Provider: Oneita Charmaine Agent, NP Primary Cardiologist: [name], MD  Primary Electrophysiologist:  None   Patient Profile:   Lindsay Rush is a 89 year old female with a history of COPD, paroxsymal atrial fibrillation on rivaroxaban , hypothyroidism, encephalomalacia, and chronic small vessel damage.  History of Present Illness:   Lindsay Rush presents with chest pain. She states that she noticed that she began to have lower left sided chest pain. She has had the pain before and didn't think much of it, but this pain did not dissipate. No radiation. It is not associated with shortness of breath or nausea. She states that her father had several heart attacks before he dies at the age of 48. She currently lives in an assisted living.   Past Medical History:  Diagnosis Date   Atrial fibrillation (HCC)    Chronic diastolic CHF (congestive heart failure) (HCC)    Chronic hypotension    COPD (chronic obstructive pulmonary disease) (HCC)    Dementia (HCC)    Fall    Hyperlipidemia    Hypertension    Hypothyroidism    Insomnia    MCI (mild cognitive impairment)    Thyrotoxicosis     Past Surgical History:  Procedure Laterality Date   ABDOMINAL HYSTERECTOMY     APPENDECTOMY     CHOLECYSTECTOMY     left knee replacement  12/2020     Medications Prior to Admission: Prior to Admission medications   Medication Sig Start Date End Date Taking? Authorizing Provider  ACETAMINOPHEN  EXTRA STRENGTH 500 MG tablet Take 500 mg by mouth 3 (three) times daily. 07/28/21   [provider]  Cholecalciferol (VITAMIN D3) 1000 units CAPS Take by mouth.    [provider]  diclofenac  Sodium (VOLTAREN ) 1 % GEL Apply 4 g topically 4 (four) times daily. 10/29/21   Emil Share, DO  digoxin  (LANOXIN ) 0.125 MG tablet Take 1 tablet (0.125 mg total) by  mouth daily. 04/08/21 01/17/24  Shona Terry SAILOR, DO  docusate sodium  (COLACE) 50 MG capsule Take 50 mg by mouth daily.    [provider]  donepezil  (ARICEPT ) 10 MG tablet Take 1 tablet (10 mg total) by mouth at bedtime. 01/17/24   Sater, Charlie LABOR, MD  escitalopram  (LEXAPRO ) 10 MG tablet Take 1 tablet (10 mg total) by mouth daily. 06/10/23   Sater, Charlie LABOR, MD  ezetimibe  (ZETIA ) 10 MG tablet Take 10 mg by mouth at bedtime.    [provider]  famotidine (PEPCID) 40 MG tablet Take 40 mg by mouth daily.    [provider]  Fluticasone -Umeclidin-Vilant (TRELEGY ELLIPTA) 200-62.5-25 MCG/ACT AEPB Inhale 1 puff into the lungs daily.    [provider]  furosemide  (LASIX ) 40 MG tablet Take 1 tablet (40 mg total) by mouth daily. 04/07/21 01/17/24  Shona Terry SAILOR, DO  lidocaine  4 % Place 1 patch onto the skin daily.    [provider]  loperamide (IMODIUM A-D) 2 MG tablet Take 2 mg by mouth 4 (four) times daily as needed for diarrhea or loose stools.    [provider]  loratadine (CLARITIN) 10 MG tablet Take 10 mg by mouth daily.    [provider]  polyethylene glycol powder (GLYCOLAX /MIRALAX ) 17 GM/SCOOP powder Take 17 g by mouth daily.    [provider]  potassium chloride  (KLOR-CON ) 20 MEQ packet Take 20 mEq by  mouth daily. 04/08/21 01/17/24  Shona Terry SAILOR, DO  Rivaroxaban  (XARELTO ) 15 MG TABS tablet Take 15 mg by mouth daily.    [provider]  thyroid  (ARMOUR) 30 MG tablet Take 30 mg by mouth daily before breakfast.    [provider]  traZODone  (DESYREL ) 50 MG tablet Take 50 mg by mouth at bedtime.    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  Tobacco Use   Smoking status: Never   Smokeless tobacco: Never  Vaping Use   Vaping status: Never Used  Substance and Sexual Activity   Alcohol use: Never   Drug use: Never    Comment: unable to answer   Sexual activity: Not Currently  Other Topics Concern   Not on file  Social History Narrative   06/11/21 resides at [facility]   Social Drivers of Health   Financial Resource Strain: Not on file  Food Insecurity: Not on file  Transportation Needs: Not on file  Physical Activity: Not on file  Stress: Not on file  Social Connections: Not on file  Intimate Partner Violence: Not on file    Family History:   The patient's family history includes Heart attack in her father.    Review of Systems: [y] = yes, [ ]  = no    General: Weight gain [ ] ; Weight loss [ ] ; Anorexia [ ] ; Fatigue [ ] ; Fever [ ] ; Chills [ ] ; Weakness [ ]   Cardiac: Chest pain/pressure Davis.Dad ]; Resting SOB [ ] ; Exertional SOB [ ] ; Orthopnea [ ] ; Pedal Edema [ ] ; Palpitations [ ] ; Syncope [ ] ; Presyncope [ ] ; Paroxysmal nocturnal dyspnea[ ]   Pulmonary: Cough [ ] ; Wheezing[ ] ; Hemoptysis[ ] ; Sputum [ ] ; Snoring [ ]   GI: Vomiting[ ] ; Dysphagia[ ] ; Melena[ ] ; Hematochezia [ ] ; Heartburn[ ] ; Abdominal pain [ ] ; Constipation [ ] ; Diarrhea [ ] ; BRBPR [ ]   GU: Hematuria[ ] ; Dysuria [ ] ; Nocturia[ ]   Vascular: Pain in legs with walking [ ] ; Pain in feet with lying flat [ ] ; Non-healing sores [ ] ; Stroke [ ] ; TIA [ ] ; Slurred speech [ ] ;  Neuro: Headaches[ ] ; Vertigo[ ] ; Seizures[ ] ; Paresthesias[ ] ;Blurred vision [ ] ; Diplopia [ ] ; Vision changes [ ]   Ortho/Skin: Arthritis [ ] ; Joint pain [ ] ; Muscle pain [ ] ; Joint swelling [ ] ; Back Pain [ ] ; Rash [ ]   Psych: Depression[ ] ; Anxiety[ ]   Heme: Bleeding problems [ ] ; Clotting disorders [ ] ; Anemia [ ]   Endocrine: Diabetes [ ] ; Thyroid  dysfunction[ ]   Physical Exam/Data:   Vitals:   01/27/24 2130 01/27/24  2145 01/28/24 0326 01/28/24 0327  BP: 114/88 105/74 122/87   Pulse: 86  77   Resp: 13 (!) 38 (!) 24   Temp:    97.6 F (36.4 C)  TempSrc:    Oral  SpO2: 97%  98%   Weight:      Height:        Intake/Output Summary (Last 24 hours) at 01/28/2024 0411 Last data filed at 01/28/2024 0310 Gross per 24 hour  Intake 1000.22  ml  Output --  Net 1000.22 ml   Filed Weights   01/27/24 1859  Weight: 47 kg   Body mass index is 18.95 kg/m.  General:  Well nourished, well developed, in no acute distress HEENT: normal Neck: no JVD Cardiac:  normal S1, S2; RRR; no murmur  Lungs:  clear to auscultation bilaterally, no wheezing, rhonchi or rales  Abd: soft, nontender, no hepatomegaly  Ext: no edema Musculoskeletal:  No deformities Skin: warm and dry; ecchymosis on her legs  Psych:  Normal affect    EKG:   EKG ob 01/28/2024 Atrial fibrillation heart rate 96 bpm with LVH. ST segment depressions in II, III, aVF, V5 and V6. No significant changes when compared to 04/03/2021 18:03   Relevant CV Studies: November 2022 ECHO Left ventricular ejection fraction, by estimation, is 55 to 60%. The  left ventricle has normal function. The left ventricle has no regional  wall motion abnormalities. There is mild left ventricular hypertrophy.  Left ventricular diastolic parameters  are indeterminate.   2. Right ventricular systolic function is mildly reduced. The right  ventricular size is normal. There is normal pulmonary artery systolic  pressure. The estimated right ventricular systolic pressure is 29.6 mmHg.   3. Left atrial size was mildly dilated.   4. Right atrial size was mildly dilated.   5. 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 Mild  to moderate mitral valve regurgitation. No evidence of mitral stenosis.   6. Tricuspid valve regurgitation is mild to moderate.   7. The aortic valve is grossly normal. Aortic valve regurgitation is not  visualized. No aortic stenosis  is present.   8. The inferior vena cava is normal in size with greater than 50%  respiratory variability, suggesting right atrial pressure of 3 mmHg.   Laboratory Data: Component     Latest Ref Rng 01/27/2024  Sodium     135 - 145 mmol/L 137   Potassium     3.5 - 5.1 mmol/L 4.3   Chloride     98 - 111 mmol/L 102   CO2     22 - 32 mmol/L 24   Glucose     70 - 99 mg/dL 888 (H)   BUN     8 - 23 mg/dL 18   Creatinine     9.55 - 1.00 mg/dL 8.84 (H)   Calcium      8.9 - 10.3 mg/dL 9.3   GFR, Estimated     >60 mL/min 46 (L)   Anion gap     5 - 15  11    Component     Latest Ref Rng 01/27/2024  WBC     4.0 - 10.5 K/uL 14.8 (H)   RBC     3.87 - 5.11 MIL/uL 4.18   Hemoglobin     12.0 - 15.0 g/dL 86.4   HCT     63.9 - 53.9 % 41.2   MCV     80.0 - 100.0 fL 98.6   MCH     26.0 - 34.0 pg 32.3   MCHC     30.0 - 36.0 g/dL 67.1   RDW     88.4 - 84.4 % 12.9   Platelets     150 - 400 K/uL 224   nRBC     0.0 - 0.2 % 0.0   Neutrophils     % 69   NEUT#     1.7 - 7.7 K/uL 10.2 (H)   Lymphocytes     %  23   Lymphs Abs     0.7 - 4.0 K/uL 3.5   Monocytes Relative     % 7   Monocyte #     0.1 - 1.0 K/uL 1.0   Eosinophil     % 0   Eosinophils Absolute     0.0 - 0.5 K/uL 0.0   Basophil     % 0   Basophils Absolute     0.0 - 0.1 K/uL 0.1   Immature Granulocytes     % 1   Abs Immature Granulocytes     0.00 - 0.07 K/uL 0.07     Legend: (H) High Component     Latest Ref Rng 01/27/2024 01/28/2024  Troponin I (High Sensitivity)     <18 ng/L 602 (HH)  659 (HH)     Legend: (HH) High Panic  Radiology/Studies:  CT Angio Chest Pulmonary Embolism (PE) W or WO Contrast Result Date: 01/28/2024 CLINICAL DATA:  Cough and shortness of breath EXAM: CT ANGIOGRAPHY CHEST WITH CONTRAST TECHNIQUE: Multidetector CT imaging of the chest was performed using the standard protocol during bolus administration of intravenous contrast. Multiplanar CT image reconstructions and MIPs were obtained  to evaluate the vascular anatomy. RADIATION DOSE REDUCTION: This exam was performed according to the departmental dose-optimization program which includes automated exposure control, adjustment of the mA and/or kV according to patient size and/or use of iterative reconstruction technique. CONTRAST:  50mL OMNIPAQUE  IOHEXOL  350 MG/ML SOLN COMPARISON:  Chest x-ray from the previous day.  CT from 11/19/2022 FINDINGS: Cardiovascular: Atherosclerotic calcifications of the aorta are noted. No aneurysmal dilatation is seen. The pulmonary artery shows a normal branching pattern bilaterally. No intraluminal filling defect to suggest pulmonary embolism is noted. Mediastinum/Nodes: Thoracic inlet is within normal limits. No hilar or mediastinal adenopathy is noted. The esophagus as visualized is within normal limits. Lungs/Pleura: Mild emphysematous changes are noted. Chronic subpleural fibrotic changes are noted in the inferior left upper lobe similar to that seen on prior exam. No sizable effusion is seen. No focal confluent infiltrate is seen. Upper Abdomen: Hepatic and renal cysts are noted. No follow-up is recommended. Musculoskeletal: No chest wall abnormality. No acute or significant osseous findings. Review of the MIP images confirms the above findings. IMPRESSION: No evidence of pulmonary embolism. Chronic subpleural fibrotic changes noted worst in left upper lobe. Aortic Atherosclerosis (ICD10-I70.0) and Emphysema (ICD10-J43.9). Electronically Signed   By: Oneil Devonshire M.D.   On: 01/28/2024 01:55   DG Chest 2 View Result Date: 01/27/2024 CLINICAL DATA:  Cough EXAM: CHEST - 2 VIEW COMPARISON:  04/03/2021 FINDINGS: Frontal and lateral views of the chest demonstrates stable enlargement of the cardiac silhouette. Ectasia and atherosclerosis of the thoracic aorta again noted. Chronic pulmonary vascular congestion without acute airspace disease, effusion, or pneumothorax. Stable bilateral scarring. No acute bony  abnormalities. IMPRESSION: 1. Enlarged cardiac silhouette. 2. Stable bilateral scarring.  No acute airspace disease. Electronically Signed   By: Ozell Daring M.D.   On: 01/27/2024 23:22    Assessment and Plan:  Ms. Bowley is a 89 year old female with a history of COPD, paroxsymal atrial fibrillation on rivaroxaban , hypothyroidism, encephalomalacia, and chronic small vessel damage. She presents with chest pain and and elevated troponin concerning for NSTEMI.   Of note, Ms. Everman has some mild dementia.However, during my 15 minute conversation with her, she was able to ask questions about symptoms of a myocardial infarction, insert that she is currently on a blood thinner when I began to  discuss DAPT, express that she would only be interested in medical management if is was not inferior to a stent, and participate in her care in many more ways. Based off my examination, she has capacity to make decisions regarding her care. When we did discuss the risks and benefits of NSTEMI management, she expressed her preference would be to not intervene with a PCI and attempt medical management first. We did discuss that often times vasodilators that help with pain can cause hypotension and headaches, both of which she has experienced in the past. She would still prefer a trial of medical management.    NSTEMI: Her suspected diagnosis is NSTEMI. I have reviewed her CT- PE from tonight and she does have LAD coronary calcifications. It is unclear when her last rivaroxaban  dose was; I have asked for a Xa level to be drawn prior to giving heparin  due to increased risk of bleeding. If Xa level is not therapeutic, begin heparin  Heparin  gtt  - Additionally, we will hold on Plavix at this time and resume it when her X-a level returns and is below therapeutic levels.   - ASA 324 mg now; followed by daily 81 mg  - Rosuvastatin 20 mg  - Isrodil 5 mg BID for pain control- will monitor for hypotension  - ECHO to evaluate  for WMA   Permanent Afib:  She is currently on digoxin  for rate control. Low body mass in older adults increases the risk of digoxin  toxicity. Ideally she would be on AV-nodal blockade with a beta-blocker or calcium  channel blocker, but she has a history of hypotension requiring midodrine  and fludrocortisone  which is why digoxin  was selected.  - Obtain digoxin  level; if level is high, we can consider decreasing dose to 3x/week - Hold rivaroxaban ; we will have to transition her to apixaban given her new need for a P2Y12   Admit to medicine with cardiology consult   Merlene Blood, MD MS  Cardiology [facility]

## 2024-01-28 NOTE — ED Notes (Signed)
 Report to Med Tech at [place].

## 2024-01-28 NOTE — ED Notes (Signed)
 PT reprt given to receiving RN(Shawn)

## 2024-01-28 NOTE — Discharge Summary (Signed)
 Physician Discharge Summary   Patient: Lindsay Rush MRN: [id number] DOB: March 04, 1935  Admit date:     01/27/2024  Discharge date: 01/28/24  Discharge Physician: Marsha Ada   PCP: Oneita Charmaine Agent, NP   Recommendations at discharge:   Please take all medications as prescribed, especially aspirin , atorvastatin , metoprolol , and Imdur  which are new. Please follow-up with your new cardiologist as advised.  Discharge Diagnoses: Principal Problem:   NSTEMI (non-ST elevated myocardial infarction) (HCC)  Resolved Problems:   * No resolved hospital problems. [facility] Course: No notes on file  Assessment and Plan: 68F h/o COPD, paroxsymal atrial fibrillation on rivaroxaban , hypothyroidism admitted to the hospital with recurrent episodes of chest pain that started 2 nights ago and found to have NSTEMI.   Pt was evaluated by cardiology, and deferred cardiac catheterization in favor of medical management for her NSTEMI.   Per cards, Discussed with the patient that she has had non-ST elevation myocardial infarction and that we should probably proceed with cardiac catheterization to evaluate her coronary anatomy although her chest pain is clearly very atypical.  However patient states that she is not hurting on a regular basis and only hurts when she presses, came into the hospital with very vague complaints of chest pain and also cough, I tried to reach her daughter and I was unable to get in touch with her having tried twice.   Patient does not want to proceed with cardiac catheterization.  She prefers to have medical therapy only for non-STEMI.  As patient is on Xarelto , will add aspirin  81 mg daily and also add low-dose of a beta-blocker, metoprolol  succinate 25 mg 1/2 tablet daily along with isosorbide  mononitrate, and discharged her home.   With regard to hypercholesterolemia, I will add atorvastatin  10 mg daily, continue Zetia  goal LDL <70.   With regard to permanent atrial  fibrillation, she is on Xarelto , on appropriate dose.  Continue the same.  She is also on digoxin  and rate is well-controlled.  Probably has been on digoxin  for a very long period of time.  We could discontinue digoxin  in view of starting beta-blocker therapy.   With regard to chronic renal insufficiency she is on low-dose of lisinopril which is appropriate, continue the same.   Will repeat EKG for documentation, echocardiogram pending, she can be discharged home on medical therapy and will arrange outpatient follow-up.      Consultants: Cardiology Procedures performed: N/A  Disposition: Assisted living Diet recommendation:  Discharge Diet Orders (From admission, onward)     Start     Ordered   01/28/24 0000  Diet - low sodium heart healthy        01/28/24 1237           Cardiac diet DISCHARGE MEDICATION: Allergies as of 01/28/2024       Reactions   Codeine Other (See Comments)   Unknown         Medication List     TAKE these medications    Acetaminophen  Extra Strength 500 MG Tabs Take 1,000 mg by mouth 3 (three) times daily. What changed: Another medication with the same name was removed. Continue taking this medication, and follow the directions you see here.   aspirin  EC 81 MG tablet Take 1 tablet (81 mg total) by mouth daily. Swallow whole. Start taking on: January 29, 2024   atorvastatin  10 MG tablet Commonly known as: LIPITOR Take 1 tablet (10 mg total) by mouth daily.   cyanocobalamin 1000 MCG tablet  Commonly known as: VITAMIN B12 Take 1,000 mcg by mouth daily.   digoxin  0.125 MG tablet Commonly known as: LANOXIN  Take 1 tablet (0.125 mg total) by mouth daily.   docusate sodium  100 MG capsule Commonly known as: COLACE Take 100 mg by mouth every Monday, Wednesday, and Friday.   donepezil  10 MG tablet Commonly known as: ARICEPT  Take 1 tablet (10 mg total) by mouth at bedtime.   escitalopram  10 MG tablet Commonly known as: LEXAPRO  Take 1  tablet (10 mg total) by mouth daily. What changed: how much to take   ezetimibe  10 MG tablet Commonly known as: ZETIA  Take 10 mg by mouth at bedtime.   famotidine 40 MG tablet Commonly known as: PEPCID Take 40 mg by mouth daily.   furosemide  40 MG tablet Commonly known as: LASIX  Take 1 tablet (40 mg total) by mouth daily.   isosorbide  mononitrate 30 MG 24 hr tablet Commonly known as: IMDUR  Take 1 tablet (30 mg total) by mouth daily. Start taking on: January 29, 2024   lisinopril 2.5 MG tablet Commonly known as: ZESTRIL Take 2.5 mg by mouth daily.   loratadine 10 MG tablet Commonly known as: CLARITIN Take 10 mg by mouth daily.   methocarbamol 500 MG tablet Commonly known as: ROBAXIN Take 250 mg by mouth in the morning and at bedtime.   metoprolol  succinate 25 MG 24 hr tablet Commonly known as: TOPROL -XL Take 0.5 tablets (12.5 mg total) by mouth daily. Start taking on: January 29, 2024   Rivaroxaban  15 MG Tabs tablet Commonly known as: XARELTO  Take 15 mg by mouth daily.   thyroid  30 MG tablet Commonly known as: ARMOUR Take 30 mg by mouth daily before breakfast.   traZODone  50 MG tablet Commonly known as: DESYREL  Take 25 mg by mouth at bedtime.   Trelegy Ellipta 200-62.5-25 MCG/ACT Aepb Generic drug: Fluticasone -Umeclidin-Vilant Inhale 1 puff into the lungs daily.   VITAMIN D PO Take 1 tablet by mouth daily.   Voltaren  1 % Gel Generic drug: diclofenac  Sodium Apply 1 Application topically 4 (four) times daily.        Discharge Exam: Filed Weights   01/27/24 1859  Weight: 47 kg   General: Alert, oriented x3, resting comfortably in no acute distress Respiratory: Lungs clear to auscultation bilaterally with normal respiratory effort; no w/r/r Cardiovascular: Regular rate and rhythm w/o m/r/g   Condition at discharge: stable  The results of significant diagnostics from this hospitalization (including imaging, microbiology, ancillary and  laboratory) are listed below for reference.   Imaging Studies: CT Angio Chest Pulmonary Embolism (PE) W or WO Contrast Result Date: 01/28/2024 CLINICAL DATA:  Cough and shortness of breath EXAM: CT ANGIOGRAPHY CHEST WITH CONTRAST TECHNIQUE: Multidetector CT imaging of the chest was performed using the standard protocol during bolus administration of intravenous contrast. Multiplanar CT image reconstructions and MIPs were obtained to evaluate the vascular anatomy. RADIATION DOSE REDUCTION: This exam was performed according to the departmental dose-optimization program which includes automated exposure control, adjustment of the mA and/or kV according to patient size and/or use of iterative reconstruction technique. CONTRAST:  50mL OMNIPAQUE  IOHEXOL  350 MG/ML SOLN COMPARISON:  Chest x-ray from the previous day.  CT from 11/19/2022 FINDINGS: Cardiovascular: Atherosclerotic calcifications of the aorta are noted. No aneurysmal dilatation is seen. The pulmonary artery shows a normal branching pattern bilaterally. No intraluminal filling defect to suggest pulmonary embolism is noted. Mediastinum/Nodes: Thoracic inlet is within normal limits. No hilar or mediastinal adenopathy is noted. The esophagus as  visualized is within normal limits. Lungs/Pleura: Mild emphysematous changes are noted. Chronic subpleural fibrotic changes are noted in the inferior left upper lobe similar to that seen on prior exam. No sizable effusion is seen. No focal confluent infiltrate is seen. Upper Abdomen: Hepatic and renal cysts are noted. No follow-up is recommended. Musculoskeletal: No chest wall abnormality. No acute or significant osseous findings. Review of the MIP images confirms the above findings. IMPRESSION: No evidence of pulmonary embolism. Chronic subpleural fibrotic changes noted worst in left upper lobe. Aortic Atherosclerosis (ICD10-I70.0) and Emphysema (ICD10-J43.9). Electronically Signed   By: Oneil Devonshire M.D.   On:  01/28/2024 01:55   DG Chest 2 View Result Date: 01/27/2024 CLINICAL DATA:  Cough EXAM: CHEST - 2 VIEW COMPARISON:  04/03/2021 FINDINGS: Frontal and lateral views of the chest demonstrates stable enlargement of the cardiac silhouette. Ectasia and atherosclerosis of the thoracic aorta again noted. Chronic pulmonary vascular congestion without acute airspace disease, effusion, or pneumothorax. Stable bilateral scarring. No acute bony abnormalities. IMPRESSION: 1. Enlarged cardiac silhouette. 2. Stable bilateral scarring.  No acute airspace disease. Electronically Signed   By: Ozell Daring M.D.   On: 01/27/2024 23:22    Microbiology: Results for orders placed or performed during the hospital encounter of 01/27/24  Resp panel by RT-PCR (RSV, Flu A&B, Covid) Anterior Nasal Swab     Status: None   Collection Time: 01/27/24  7:02 PM   Specimen: Anterior Nasal Swab  Result Value Ref Range Status   SARS Coronavirus 2 by RT PCR NEGATIVE NEGATIVE Final   Influenza A by PCR NEGATIVE NEGATIVE Final   Influenza B by PCR NEGATIVE NEGATIVE Final    Comment: (NOTE) The Xpert Xpress SARS-CoV-2/FLU/RSV plus assay is intended as an aid in the diagnosis of influenza from Nasopharyngeal swab specimens and should not be used as a sole basis for treatment. Nasal washings and aspirates are unacceptable for Xpert Xpress SARS-CoV-2/FLU/RSV testing.  Fact Sheet for Patients: BloggerCourse.com  Fact Sheet for Healthcare Providers: SeriousBroker.it  This test is not yet approved or cleared by the [place]  FDA and has been authorized for detection and/or diagnosis of SARS-CoV-2 by FDA under an Emergency Use Authorization (EUA). This EUA will remain in effect (meaning this test can be used) for the duration of the COVID-19 declaration under Section 564(b)(1) of the Act, 21 U.S.C. section 360bbb-3(b)(1), unless the authorization is terminated or revoked.      Resp Syncytial Virus by PCR NEGATIVE NEGATIVE Final    Comment: (NOTE) Fact Sheet for Patients: BloggerCourse.com  Fact Sheet for Healthcare Providers: SeriousBroker.it  This test is not yet approved or cleared by the [place]  FDA and has been authorized for detection and/or diagnosis of SARS-CoV-2 by FDA under an Emergency Use Authorization (EUA). This EUA will remain in effect (meaning this test can be used) for the duration of the COVID-19 declaration under Section 564(b)(1) of the Act, 21 U.S.C. section 360bbb-3(b)(1), unless the authorization is terminated or revoked.  Performed at [facility], [address], [place], [place] [address]     Labs: CBC: Recent Labs  Lab 01/27/24 2325  WBC 14.8*  NEUTROABS 10.2*  HGB 13.5  HCT 41.2  MCV 98.6  PLT 224   Basic Metabolic Panel: Recent Labs  Lab 01/27/24 2325  NA 137  K 4.3  CL 102  CO2 24  GLUCOSE 111*  BUN 18  CREATININE 1.15*  CALCIUM  9.3   Liver Function Tests: No results for input(s): AST, ALT,  ALKPHOS, BILITOT, PROT, ALBUMIN in the last 168 hours. CBG: No results for input(s): GLUCAP in the last 168 hours.  Discharge time spent: less than 30 minutes.  Signed: Marsha Ada, MD [facility] 01/28/2024

## 2024-01-28 NOTE — ED Notes (Signed)
 Attempted to call report, staff states they are almost finished passing lunch trays and will call right back.

## 2024-01-28 NOTE — ED Provider Notes (Signed)
 [place] [facility] Provider Note   CSN: [id number] Arrival date & time: 01/27/24  1857     History Chief Complaint  Patient presents with   Cough    HPI Artemisa Sladek is a 89 y.o. female presenting for chief complaint of chest pain/cough.  Endorses a few days of chest pain that her story is changing.  Initially said a few weeks since that a few days.  Initially said substernal now saying left-sided.  Patient's recorded medical, surgical, social, medication list and allergies were reviewed in the Snapshot window as part of the initial history.   Review of Systems   Review of Systems  Constitutional:  Negative for chills and fever.  HENT:  Negative for ear pain and sore throat.   Eyes:  Negative for pain and visual disturbance.  Respiratory:  Negative for cough and shortness of breath.   Cardiovascular:  Positive for chest pain. Negative for palpitations.  Gastrointestinal:  Negative for abdominal pain and vomiting.  Genitourinary:  Negative for dysuria and hematuria.  Musculoskeletal:  Negative for arthralgias and back pain.  Skin:  Negative for color change and rash.  Neurological:  Negative for seizures and syncope.  All other systems reviewed and are negative.   Physical Exam Updated Vital Signs BP 122/87   Pulse 77   Temp 97.6 F (36.4 C) (Oral)   Resp (!) 24   Ht 5' 2 (1.575 m)   Wt 47 kg   SpO2 98%   BMI 18.95 kg/m  Physical Exam Vitals and nursing note reviewed.  Constitutional:      General: She is not in acute distress.    Appearance: She is well-developed.  HENT:     Head: Normocephalic and atraumatic.  Eyes:     Conjunctiva/sclera: Conjunctivae normal.  Cardiovascular:     Rate and Rhythm: Normal rate and regular rhythm.     Heart sounds: No murmur heard. Pulmonary:     Effort: Pulmonary effort is normal. No respiratory distress.     Breath sounds: Normal breath sounds.  Abdominal:     General: There is  no distension.     Palpations: Abdomen is soft.     Tenderness: There is no abdominal tenderness. There is no right CVA tenderness or left CVA tenderness.  Musculoskeletal:        General: No swelling or tenderness. Normal range of motion.     Cervical back: Neck supple.  Skin:    General: Skin is warm and dry.  Neurological:     General: No focal deficit present.     Mental Status: She is alert and oriented to person, place, and time. Mental status is at baseline.     Cranial Nerves: No cranial nerve deficit.      ED Course/ Medical Decision Making/ A&P    Procedures .Critical Care  Performed by: Jerral Meth, MD Authorized by: Jerral Meth, MD   Critical care provider statement:    Critical care time (minutes):  80   Critical care was time spent personally by me on the following activities:  Development of treatment plan with patient or surrogate, discussions with consultants, evaluation of patient's response to treatment, examination of patient, ordering and review of laboratory studies, ordering and review of radiographic studies, ordering and performing treatments and interventions, pulse oximetry, re-evaluation of patient's condition and review of old charts   Care discussed with: admitting provider      Medications Ordered in ED Medications  iohexol  (OMNIPAQUE ) 350 MG/ML injection 75 mL (has no administration in time range)  aspirin  chewable tablet 324 mg (0 mg Oral Hold 01/28/24 0218)  acetaminophen  (TYLENOL ) tablet 650 mg (has no administration in time range)  prochlorperazine  (COMPAZINE ) injection 5 mg (has no administration in time range)  melatonin tablet 5 mg (has no administration in time range)  polyethylene glycol (MIRALAX  / GLYCOLAX ) packet 17 g (has no administration in time range)  0.9 %  sodium chloride  infusion (has no administration in time range)  sodium chloride  0.9 % bolus 1,000 mL (has no administration in time range)  isosorbide  dinitrate  (ISORDIL ) tablet 5 mg (5 mg Oral Given 01/28/24 0657)  lactated ringers  bolus 1,000 mL (0 mLs Intravenous Stopped 01/28/24 0310)  iohexol  (OMNIPAQUE ) 350 MG/ML injection 50 mL (50 mLs Intravenous Contrast Given 01/28/24 0146)   Medical Decision Making: Cumi Sanagustin is a 89 y.o. female who presented to the ED today with chest pain, detailed above.    Patient placed on continuous vitals and telemetry monitoring while in ED which was reviewed periodically.  Complete initial physical exam performed, notably the patient was HDS in NAD.   Reviewed and confirmed nursing documentation for past medical history, family history, social history.    Initial Assessment: With the patient's presentation of left-sided chest pain, most likely diagnosis is musculoskeletal chest pain versus GERD, although ACS remains on the differential. Other diagnoses were considered including (but not limited to) pulmonary embolism, community-acquired pneumonia, aortic dissection, pneumothorax, underlying bony abnormality, anemia. These are considered less likely due to history of present illness and physical exam findings.    This is most consistent with an acute life/limb threatening illness complicated by underlying chronic conditions.   Initial Plan: EKG and serial troponin to evaluate for cardiac pathology. Evaluate for dissection, bony abnormality, or pneumonia with chest x-ray and screening laboratory evaluation including CBC, BMP   Initial Study Results: EKG was reviewed independently. Rate, rhythm, axis, intervals all examined and without medically relevant abnormality. ST segments without concerns for elevations.    Laboratory  Delta troponin demonstrated substantial elevations   CBC and BMP without obvious metabolic or inflammatory abnormalities requiring further evaluation   Radiology  CT Angio Chest Pulmonary Embolism (PE) W or WO Contrast Result Date: 01/28/2024 CLINICAL DATA:  Cough and shortness of breath  EXAM: CT ANGIOGRAPHY CHEST WITH CONTRAST TECHNIQUE: Multidetector CT imaging of the chest was performed using the standard protocol during bolus administration of intravenous contrast. Multiplanar CT image reconstructions and MIPs were obtained to evaluate the vascular anatomy. RADIATION DOSE REDUCTION: This exam was performed according to the departmental dose-optimization program which includes automated exposure control, adjustment of the mA and/or kV according to patient size and/or use of iterative reconstruction technique. CONTRAST:  50mL OMNIPAQUE  IOHEXOL  350 MG/ML SOLN COMPARISON:  Chest x-ray from the previous day.  CT from 11/19/2022 FINDINGS: Cardiovascular: Atherosclerotic calcifications of the aorta are noted. No aneurysmal dilatation is seen. The pulmonary artery shows a normal branching pattern bilaterally. No intraluminal filling defect to suggest pulmonary embolism is noted. Mediastinum/Nodes: Thoracic inlet is within normal limits. No hilar or mediastinal adenopathy is noted. The esophagus as visualized is within normal limits. Lungs/Pleura: Mild emphysematous changes are noted. Chronic subpleural fibrotic changes are noted in the inferior left upper lobe similar to that seen on prior exam. No sizable effusion is seen. No focal confluent infiltrate is seen. Upper Abdomen: Hepatic and renal cysts are noted. No follow-up is recommended. Musculoskeletal: No chest wall abnormality. No  acute or significant osseous findings. Review of the MIP images confirms the above findings. IMPRESSION: No evidence of pulmonary embolism. Chronic subpleural fibrotic changes noted worst in left upper lobe. Aortic Atherosclerosis (ICD10-I70.0) and Emphysema (ICD10-J43.9). Electronically Signed   By: Oneil Devonshire M.D.   On: 01/28/2024 01:55   DG Chest 2 View Result Date: 01/27/2024 CLINICAL DATA:  Cough EXAM: CHEST - 2 VIEW COMPARISON:  04/03/2021 FINDINGS: Frontal and lateral views of the chest demonstrates stable  enlargement of the cardiac silhouette. Ectasia and atherosclerosis of the thoracic aorta again noted. Chronic pulmonary vascular congestion without acute airspace disease, effusion, or pneumothorax. Stable bilateral scarring. No acute bony abnormalities. IMPRESSION: 1. Enlarged cardiac silhouette. 2. Stable bilateral scarring.  No acute airspace disease. Electronically Signed   By: Ozell Daring M.D.   On: 01/27/2024 23:22    Final Assessment and Plan: Consult with cardiology for NSTEMI.  They recommended heparin  after factor X level and admission to the medical service given reported elevated blood pressures over the past few days.        Clinical Impression:  1. NSTEMI (non-ST elevated myocardial infarction) (HCC)      Admit   Final Clinical Impression(s) / ED Diagnoses Final diagnoses:  NSTEMI (non-ST elevated myocardial infarction) [facility])    Rx / DC Orders ED Discharge Orders     None         Jerral Meth, MD 01/28/24 [phone]

## 2024-01-28 NOTE — Progress Notes (Signed)
 PHARMACY - ANTICOAGULATION CONSULT NOTE  Pharmacy Consult for heparin  Indication: chest pain/ACS  Allergies  Allergen Reactions   Codeine Other (See Comments)    Unknown     Patient Measurements: Height: 5' 2 (157.5 cm) Weight: 47 kg (103 lb 9.9 oz) IBW/kg (Calculated) : 50.1 HEPARIN  DW (KG): 47  Vital Signs: Temp: 97.6 F (36.4 C) (09/19 0852) Temp Source: Oral (09/19 0852) BP: 118/78 (09/19 0800) Pulse Rate: 76 (09/19 0800)  Labs: Recent Labs    01/27/24 2325 01/28/24 0247 01/28/24 0629  HGB 13.5  --   --   HCT 41.2  --   --   PLT 224  --   --   HEPARINUNFRC  --   --  >1.10*  CREATININE 1.15*  --   --   TROPONINIHS 602* 659*  --     Estimated Creatinine Clearance: 26.1 mL/min (A) (by C-G formula based on SCr of 1.15 mg/dL (H)).   Medical History: Past Medical History:  Diagnosis Date   Atrial fibrillation (HCC)    Chronic diastolic CHF (congestive heart failure) (HCC)    Chronic hypotension    COPD (chronic obstructive pulmonary disease) (HCC)    Dementia (HCC)    Fall    Hyperlipidemia    Hypertension    Hypothyroidism    Insomnia    MCI (mild cognitive impairment)    Thyrotoxicosis     Assessment: 37 yoF presenting to the ED with chest pain. The patient was taking Xarelto  PTA (Last dose unable to be verified from facility). Baseline heparin  level elevated indicating recent doses given. Patient presented to the ED yesterday afternoon so we know its been atleast 18 hours since last dose. Pharmacy consulted to manage heparin  infusion.   Goal of Therapy:  Heparin  level 0.3-0.7 units/ml aPTT 66-102 seconds Monitor platelets by anticoagulation protocol: Yes   Plan:  Start heparin  infusion at 550 units/hr Check anti-Xa level in 8 hours and daily while on heparin  Continue to monitor H&H and platelets  Koren Or, PharmD Clinical Pharmacist 01/28/2024 9:29 AM Please check AMION for all [facility] Pharm[facility] numbers

## 2024-01-28 NOTE — ED Notes (Signed)
 Attempted to call report to [facility], no answer, unable to leave VM.

## 2024-01-28 NOTE — Progress Notes (Signed)
 Attempted Echocardiogram at 12:31 pm. Could not locate the patient. Looked in the patients chart and learned that the patient was discharged.

## 2024-01-28 NOTE — H&P (Incomplete)
 History and Physical    Patient: Lindsay Rush FMW:[id number] DOB: [birth year] DOA: 01/27/2024 DOS: the patient was seen and examined on 01/28/2024 PCP: Oneita Charmaine Agent, NP  Patient coming from: {Point_of_Origin:[address]}  Chief Complaint:  Chief Complaint  Patient presents with   Cough   HPI: Lindsay Rush is a 89 y.o. female with medical history significant of ***  Review of Systems: {ROS_Text:26778} Past Medical History:  Diagnosis Date   Atrial fibrillation (HCC)    Chronic diastolic CHF (congestive heart failure) (HCC)    Chronic hypotension    COPD (chronic obstructive pulmonary disease) (HCC)    Dementia (HCC)    Fall    Hyperlipidemia    Hypertension    Hypothyroidism    Insomnia    MCI (mild cognitive impairment)    Thyrotoxicosis    Past Surgical History:  Procedure Laterality Date   ABDOMINAL HYSTERECTOMY     APPENDECTOMY     CHOLECYSTECTOMY     left knee replacement  12/2020   Social History:  reports that she has never smoked. She has never used smokeless tobacco. She reports that she does not drink alcohol and does not use drugs.  Allergies  Allergen Reactions   Codeine     Family History  Problem Relation Age of Onset   Heart attack Father     Prior to Admission medications   Medication Sig Start Date End Date Taking? Authorizing Provider  ACETAMINOPHEN  EXTRA STRENGTH 500 MG tablet Take 500 mg by mouth 3 (three) times daily. 07/28/21   [provider]  Cholecalciferol (VITAMIN D3) 1000 units CAPS Take by mouth.    [provider]  diclofenac  Sodium (VOLTAREN ) 1 % GEL Apply 4 g topically 4 (four) times daily. 10/29/21   Floyd, Dan, DO  digoxin  (LANOXIN ) 0.125 MG tablet Take 1 tablet (0.125 mg total) by mouth daily. 04/08/21 01/17/24  Shona Terry SAILOR, DO  docusate sodium  (COLACE) 50 MG capsule Take 50 mg by mouth daily.    [provider]  donepezil  (ARICEPT ) 10 MG tablet Take 1 tablet (10 mg total) by mouth at  bedtime. 01/17/24   Sater, Charlie LABOR, MD  escitalopram  (LEXAPRO ) 10 MG tablet Take 1 tablet (10 mg total) by mouth daily. 06/10/23   Sater, Charlie LABOR, MD  ezetimibe  (ZETIA ) 10 MG tablet Take 10 mg by mouth at bedtime.    [provider]  famotidine (PEPCID) 40 MG tablet Take 40 mg by mouth daily.    [provider]  Fluticasone -Umeclidin-Vilant (TRELEGY ELLIPTA) 200-62.5-25 MCG/ACT AEPB Inhale 1 puff into the lungs daily.    [provider]  furosemide  (LASIX ) 40 MG tablet Take 1 tablet (40 mg total) by mouth daily. 04/07/21 01/17/24  Shona Terry SAILOR, DO  lidocaine  4 % Place 1 patch onto the skin daily.    [provider]  loperamide (IMODIUM A-D) 2 MG tablet Take 2 mg by mouth 4 (four) times daily as needed for diarrhea or loose stools.    [provider]  loratadine (CLARITIN) 10 MG tablet Take 10 mg by mouth daily.    [provider]  polyethylene glycol powder (GLYCOLAX /MIRALAX ) 17 GM/SCOOP powder Take 17 g by mouth daily.    [provider]  potassium chloride  (KLOR-CON ) 20 MEQ packet Take 20 mEq by mouth daily. 04/08/21 01/17/24  Shona Terry SAILOR, DO  Rivaroxaban  (XARELTO ) 15 MG TABS tablet Take 15 mg by mouth daily.    [provider]  thyroid  (ARMOUR) 30 MG tablet Take  30 mg by mouth daily before breakfast.    [provider]  traZODone  (DESYREL ) 50 MG tablet Take 50 mg by mouth at bedtime.    [provider]    Physical Exam: Vitals:   01/27/24 2130 01/27/24 2145 01/28/24 0326 01/28/24 0327  BP: 114/88 105/74 122/87   Pulse: 86  77   Resp: 13 (!) 38 (!) 24   Temp:    97.6 F (36.4 C)  TempSrc:    Oral  SpO2: 97%  98%   Weight:      Height:       *** Data Reviewed: {Tip this will not be part of the note when signed- Document your independent interpretation of telemetry tracing, EKG, lab, Radiology test or any other diagnostic tests. Add any new diagnostic test ordered  today. (Optional):26781} {Results:26384}  Assessment and Plan: No notes have been filed under this hospital service. Service: Hospitalist     Advance Care Planning:   Code Status: Full Code ***  Consults: ***  Family Communication: ***  Severity of Illness: {Observation/Inpatient:21159}  Author: Marsha Ada, MD 01/28/2024 7:25 AM  For on call review [email or web address].

## 2024-01-28 NOTE — Progress Notes (Addendum)
 Patient Name: Lindsay Rush Date of Encounter: 01/28/2024 [facility] Cardiologist: [name], MD  01/27/2024 .admit Length of stay: 0  Interval Summary  .    Lindsay Rush is a 89 y.o. emale with a history of COPD, paroxsymal atrial fibrillation on rivaroxaban , hypothyroidism admitted to the hospital with recurrent episodes of chest pain that started 2 nights ago.  Describes it as sharp left-sided chest discomfort and tightness.  She lives in the assisted living place at [facility] and sent here due to chest pain.  She has mild dementia but able to give me good history. States left sided CP started few days ago and hurts when she presses on her chest. Today states the same and states she does not have any chest pain unless she presses on her chest  Physical Exam    Vitals:   01/28/24 0326 01/28/24 0327 01/28/24 0800 01/28/24 0852  BP: 122/87  118/78   Pulse: 77  76   Resp: (!) 24  (!) 22   Temp:  97.6 F (36.4 C)  97.6 F (36.4 C)  TempSrc:  Oral  Oral  SpO2: 98%  98%   Weight:      Height:       Physical Exam Neck:     Vascular: No carotid bruit or JVD.  Cardiovascular:     Rate and Rhythm: Normal rate. Rhythm irregular.     Pulses: Normal pulses and intact distal pulses.     Heart sounds: No murmur heard. Pulmonary:     Effort: Pulmonary effort is normal.     Breath sounds: Normal breath sounds.  Abdominal:     General: Bowel sounds are normal.     Palpations: Abdomen is soft.  Musculoskeletal:     Right lower leg: No edema.     Left lower leg: No edema.  Skin:    Capillary Refill: Capillary refill takes less than 2 seconds.        01/27/2024    6:59 PM 01/17/2024   11:29 AM 06/10/2023   11:47 AM  Last 3 Weights  Weight (lbs) 103 lb 9.9 oz 104 lb 111 lb  Weight (kg) 47 kg 47.174 kg 50.349 kg      Labs   Lab Results  Component Value Date   NA 137 01/27/2024   K 4.3 01/27/2024   CO2 24 01/27/2024   GLUCOSE 111 (H) 01/27/2024    BUN 18 01/27/2024   CREATININE 1.15 (H) 01/27/2024   CALCIUM  9.3 01/27/2024   GFRNONAA 46 (L) 01/27/2024       Latest Ref Rng & Units 01/27/2024   11:25 PM 02/14/2022    5:18 PM 04/06/2021   12:41 AM  BMP  Glucose 70 - 99 mg/dL 888  897  879   BUN 8 - 23 mg/dL 18  11  11    Creatinine 0.44 - 1.00 mg/dL 8.84  9.04  9.07   Sodium 135 - 145 mmol/L 137  138  136   Potassium 3.5 - 5.1 mmol/L 4.3  4.1  3.6   Chloride 98 - 111 mmol/L 102  103  98   CO2 22 - 32 mmol/L 24  24  28    Calcium  8.9 - 10.3 mg/dL 9.3  9.7  8.7        Latest Ref Rng & Units 01/27/2024   11:25 PM 02/14/2022    5:18 PM 04/06/2021   12:41 AM  CBC  WBC 4.0 - 10.5 K/uL 14.8  9.6  10.5   Hemoglobin 12.0 - 15.0 g/dL 86.4  84.9  87.0   Hematocrit 36.0 - 46.0 % 41.2  43.6  39.9   Platelets 150 - 400 K/uL 224  217  283     Lab Results  Component Value Date   CHOL 140 01/28/2024   HDL 35 (L) 01/28/2024   LDLCALC 81 01/28/2024   TRIG 118 01/28/2024   CHOLHDL 4.0 01/28/2024    Lab Results  Component Value Date   TSH 0.506 06/10/2023    Cardiac Panel (last 3 results) Recent Labs    01/27/24 2325 01/28/24 0247  TROPONINIHS 602* 659*    Intake/Output Summary (Last 24 hours) at 01/28/2024 0857 Last data filed at 01/28/2024 0310 Gross per 24 hour  Intake 1000.22 ml  Output --  Net 1000.22 ml    Net IO Since Admission: 1,000.22 mL [01/28/24 0857]  Tele/EKG/Cardiac studies    Telemetry: Atrial fibrillation.  EKG:  EKG 01/27/2024: Atrial fibrillation with controlled ventricular spots at the rate of 96 bpm, normal axis, poor R wave progression, cannot exclude anteroseptal infarct old.  Inferior and lateral ST depression, cannot exclude ischemia versus subendocardial infarct.  No significant change from 04/03/2021.  Echocardiogram 04/06/2021:  1. Left ventricular ejection fraction, by estimation, is 55 to 60%. The left ventricle has normal function. The left ventricle has no regional wall motion  abnormalities. There is mild left ventricular hypertrophy. Left ventricular diastolic parameters  are indeterminate.  2. Right ventricular systolic function is mildly reduced. The right ventricular size is normal. There is normal pulmonary artery systolic pressure. The estimated right ventricular systolic pressure is 29.6 mmHg.  3. Left atrial size was mildly dilated.  4. Right atrial size was mildly dilated.  5. 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 Mild to moderate mitral valve regurgitation. No evidence of mitral stenosis.  6. Tricuspid valve regurgitation is mild to moderate.  7. The aortic valve is grossly normal. Aortic valve regurgitation is not visualized. No aortic stenosis is present.  8. The inferior vena cava is normal in size with greater than 50% respiratory variability, suggesting right atrial pressure of 3 mmHg  Radiology  CT Angio Chest Pulmonary Embolism (PE) W or WO Contrast 01/28/2024: No evidence of pulmonary embolism.  Chronic subpleural fibrotic changes noted worst in left upper lobe.  No aneurysm.  Aortic Atherosclerosis (ICD10-I70.0) and Emphysema (ICD10-J43.9).  DG Chest 2 View Result Date: 01/27/2024 CLINICAL DATA:  Cough EXAM: CHEST - 2 VIEW COMPARISON:  04/03/2021 FINDINGS: Frontal and lateral views of the chest demonstrates stable enlargement of the cardiac silhouette. Ectasia and atherosclerosis of the thoracic aorta again noted. Chronic pulmonary vascular congestion without acute airspace disease, effusion, or pneumothorax. Stable bilateral scarring. No acute bony abnormalities. IMPRESSION: 1. Enlarged cardiac silhouette. 2. Stable bilateral scarring.  No acute airspace disease.   Current Meds:     Current Facility-Administered Medications:    0.9 %  sodium chloride  infusion, , Intravenous, Continuous, Hall, Carole N, DO   acetaminophen  (TYLENOL ) tablet 650 mg, 650 mg, Oral, Q6H PRN, Shona, Carole N, DO   aspirin  chewable tablet 324  mg, 324 mg, Oral, Once, Countryman, Chase, MD   iohexol  (OMNIPAQUE ) 350 MG/ML injection 75 mL, 75 mL, Intravenous, Once PRN, Countryman, Chase, MD   isosorbide  dinitrate (ISORDIL ) tablet 5 mg, 5 mg, Oral, BID, Osude, Nkiru C, MD, 5 mg at 01/28/24 0657   melatonin tablet 5 mg, 5 mg, Oral, QHS PRN, Shona Terry SAILOR, DO  polyethylene glycol (MIRALAX  / GLYCOLAX ) packet 17 g, 17 g, Oral, Daily PRN, Hall, Carole N, DO   prochlorperazine  (COMPAZINE ) injection 5 mg, 5 mg, Intravenous, Q6H PRN, Shona Terry SAILOR, DO  Current Outpatient Medications:    Acetaminophen  (TYLENOL  PO), Take 15 mLs by mouth daily as needed (cough). Tylenol  oral syrup (unknown dose-not listed on orders), Disp: , Rfl:    ACETAMINOPHEN  EXTRA STRENGTH 500 MG tablet, Take 1,000 mg by mouth 3 (three) times daily., Disp: , Rfl:    cyanocobalamin (VITAMIN B12) 1000 MCG tablet, Take 1,000 mcg by mouth daily., Disp: , Rfl:    diclofenac  Sodium (VOLTAREN ) 1 % GEL, Apply 1 Application topically 4 (four) times daily., Disp: , Rfl:    digoxin  (LANOXIN ) 0.125 MG tablet, Take 1 tablet (0.125 mg total) by mouth daily., Disp: 30 tablet, Rfl: 0   docusate sodium  (COLACE) 100 MG capsule, Take 100 mg by mouth every Monday, Wednesday, and Friday., Disp: , Rfl:    donepezil  (ARICEPT ) 10 MG tablet, Take 1 tablet (10 mg total) by mouth at bedtime., Disp: 90 tablet, Rfl: 3   escitalopram  (LEXAPRO ) 10 MG tablet, Take 1 tablet (10 mg total) by mouth daily. (Patient taking differently: Take 15 mg by mouth daily.), Disp: 30 tablet, Rfl: 11   ezetimibe  (ZETIA ) 10 MG tablet, Take 10 mg by mouth at bedtime., Disp: , Rfl:    famotidine (PEPCID) 40 MG tablet, Take 40 mg by mouth daily., Disp: , Rfl:    Fluticasone -Umeclidin-Vilant (TRELEGY ELLIPTA) 200-62.5-25 MCG/ACT AEPB, Inhale 1 puff into the lungs daily., Disp: , Rfl:    furosemide  (LASIX ) 40 MG tablet, Take 1 tablet (40 mg total) by mouth daily., Disp: 30 tablet, Rfl: 0   lisinopril (ZESTRIL) 2.5 MG tablet, Take  2.5 mg by mouth daily., Disp: , Rfl:    loratadine (CLARITIN) 10 MG tablet, Take 10 mg by mouth daily., Disp: , Rfl:    methocarbamol (ROBAXIN) 500 MG tablet, Take 250 mg by mouth in the morning and at bedtime., Disp: , Rfl:    Rivaroxaban  (XARELTO ) 15 MG TABS tablet, Take 15 mg by mouth daily., Disp: , Rfl:    thyroid  (ARMOUR) 30 MG tablet, Take 30 mg by mouth daily before breakfast., Disp: , Rfl:    traZODone  (DESYREL ) 50 MG tablet, Take 25 mg by mouth at bedtime., Disp: , Rfl:    VITAMIN D PO, Take 1 tablet by mouth daily., Disp: , Rfl:   Assessment & Plan .     NSTEMI Permanent atrial fibrillation Hypercholesterolemia Mild dementia Stage IIIa chronic kidney disease  Rec: Discussed with the patient that she has had non-ST elevation myocardial infarction and that we should probably proceed with cardiac catheterization to evaluate her coronary anatomy although her chest pain is clearly very atypical.  However patient states that she is not hurting on a regular basis and only hurts when she presses, came into the hospital with very vague complaints of chest pain and also cough, I tried to reach her daughter and I was unable to get in touch with her having tried twice.  Patient does not want to proceed with cardiac catheterization.  She prefers to have medical therapy only for non-STEMI.  As patient is on Xarelto , will add aspirin  81 mg daily and also add low-dose of a beta-blocker, metoprolol  succinate 25 mg 1/2 tablet daily along with isosorbide  mononitrate, and discharged her home.  With regard to hypercholesterolemia, I will add atorvastatin  10 mg daily, continue Zetia  goal LDL <70.  With regard to permanent atrial fibrillation, she is on Xarelto , on appropriate dose.  Continue the same.  She is also on digoxin  and rate is well-controlled.  Probably has been on digoxin  for a very long period of time.  We could discontinue digoxin  in view of starting beta-blocker therapy.  With regard to  chronic renal insufficiency she is on low-dose of lisinopril which is appropriate, continue the same.  Will repeat EKG for documentation, echocardiogram pending, she can be discharged home on medical therapy and will arrange outpatient follow-up.   For questions or updates, please contact [facility] Please consult [email or web address] for contact info under        Signed,   Gordy Bergamo, MD, [facility] 01/28/2024, 8:57 AM [facility] [address] [place], [place] [address] Phone: [phone]. Fax:  [phone]

## 2024-02-02 ENCOUNTER — Ambulatory Visit: Attending: Physician Assistant | Admitting: Physician Assistant

## 2024-02-02 ENCOUNTER — Telehealth: Payer: Self-pay | Admitting: Physician Assistant

## 2024-02-02 ENCOUNTER — Other Ambulatory Visit (HOSPITAL_COMMUNITY): Payer: Self-pay

## 2024-02-02 ENCOUNTER — Encounter: Payer: Self-pay | Admitting: Physician Assistant

## 2024-02-02 VITALS — BP 138/82 | HR 65 | Ht 62.0 in | Wt 104.4 lb

## 2024-02-02 DIAGNOSIS — I341 Nonrheumatic mitral (valve) prolapse: Secondary | ICD-10-CM

## 2024-02-02 DIAGNOSIS — E785 Hyperlipidemia, unspecified: Secondary | ICD-10-CM

## 2024-02-02 DIAGNOSIS — I482 Chronic atrial fibrillation, unspecified: Secondary | ICD-10-CM

## 2024-02-02 DIAGNOSIS — R079 Chest pain, unspecified: Secondary | ICD-10-CM | POA: Diagnosis not present

## 2024-02-02 MED ORDER — ATORVASTATIN CALCIUM 20 MG PO TABS: 20.0000 mg | ORAL_TABLET | Freq: Every day | ORAL | 1 refills | Status: AC

## 2024-02-02 NOTE — Telephone Encounter (Signed)
Message sent to [facility].

## 2024-02-02 NOTE — Progress Notes (Signed)
 Cardiology Office Note   Date:  02/02/2024  ID:  Lindsay Rush, DOB 07/14/1934, MRN [id number] PCP: Oneita Charmaine Agent, NP  [facility] Providers Cardiologist:  [name], MD     History of Present Illness Lindsay Rush is a 89 y.o. female with past medical history of permanent A-fib, mitral valve prolapse, dementia, hypotension, and interstitial lung disease.  Patient moved from [facility]  to [place]  in October 2021.  Per family report, patient has been in A-fib for over 20 years.  Patient was initially consulted for HFpEF during admission in 2022 after a fall. Echocardiogram at the time showed EF 55 to 60%, no wall motion abnormality, RVSP 29.6 mmHg, mild to moderate mitral regurgitation. Heart rate was well-controlled while in A-fib.  She lives at [facility] assisted living facility.  She has been taking Xarelto  and digoxin .  She had difficulty with other rate controlling agent.  She reportedly has been diagnosed with interstitial lung disease and recommended continued surveillance.  She used to be Florinef  and the midodrine  for her chronic hypotension, however both medication has been weaned off over the years.  Her low blood pressure has also resolved.    Patient was last seen by Dr. Decent in March 2023, she was on 40 mg daily of Lasix .  Her blood pressure was okay at that time and she was no longer on midodrine .  More recently, patient was admitted to the hospital on 01/28/2024 with left-sided lower chest pain and troponin was elevated.  Serial troponin 602-->659.  White blood cell count 14.8.  Digoxin  level 0.6, subtherapeutic.  Viral panel negative.  CTA of the chest was negative for PE, chronic subpleural fibrotic changes were seen in the left upper lobe.  Chest x-ray showed a stable bilateral scarring, no acute airspace disease.  She had long discussion with cardiology team and expressed her preference not to intervene with PCI and instead attempt  medical management.  Dr. Ladona who saw the patient in the hospital initially recommended cardiac catheterization, however her chest pain is very atypical and patient was clear that she did not wish to proceed with cardiac authorization.  She was placed on Isordil  for pain control.  Aspirin  81 mg daily was added on top of Xarelto .  EKG showed atrial fibrillation, heart rate 80s, diffuse ST downsloping.   Patient presents today for posthospital cardiology follow-up accompanied by daughter.  She denies any further chest discomfort since she left the hospital.  She did describe her previous chest discomfort is reproducible with palpation.  She states that [facility] assisted [facility] facility and walks to the cafeteria on a daily basis without any exertional chest discomfort.  We reviewed the hospital record.  She will need echocardiogram to reassess her ejection fraction.  Her last echocardiogram was from 2022.  She is tolerating the addition of lisinopril and Imdur  that was added during the recent hospitalization.  I recommended up titration of atorvastatin  to 20 mg daily.  Her recent LDL was 81, LDL goal should be at least less than 70.  Given addition of lisinopril and up titration of atorvastatin , I will obtain CMP and a fasting lipid panel in 4 weeks.  I plan to see the patient myself in 6 weeks for follow-up and reassessment and have her follow-up with Dr. Decent in 4 months.   ROS:   Patient had recent chest discomfort during hospitalization, however has not had any further chest discomfort.  She has no orthopnea or PND.  She  ambulates with a rolling walker.  Studies Reviewed      Cardiac Studies & Procedures   ______________________________________________________________________________________________     ECHOCARDIOGRAM  ECHOCARDIOGRAM COMPLETE 04/06/2021  Narrative ECHOCARDIOGRAM REPORT    Patient Name:   Lindsay Rush Date of Exam: 04/06/2021 Medical Rec #:  [id number]       Height:        62.0 in Accession #:    [id number]      Weight:       120.4 lb Date of Birth:  06-07-1934      BSA:          1.541 m Patient Age:    86 years        BP:           118/64 mmHg Patient Gender: F               HR:           83 bpm. Exam Location:  Inpatient  Procedure: 2D Echo  Indications:    congestive heart failure  History:        Patient has no prior history of Echocardiogram examinations. COPD, Arrythmias:Atrial Fibrillation; Risk Factors:Dyslipidemia.  Sonographer:    Tinnie Barefoot RDCS Referring Phys: [id number] VISHAL R PATEL  IMPRESSIONS   1. Left ventricular ejection fraction, by estimation, is 55 to 60%. The left ventricle has normal function. The left ventricle has no regional wall motion abnormalities. There is mild left ventricular hypertrophy. Left ventricular diastolic parameters are indeterminate. 2. Right ventricular systolic function is mildly reduced. The right ventricular size is normal. There is normal pulmonary artery systolic pressure. The estimated right ventricular systolic pressure is 29.6 mmHg. 3. Left atrial size was mildly dilated. 4. Right atrial size was mildly dilated. 5. 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 Mild to moderate mitral valve regurgitation. No evidence of mitral stenosis. 6. Tricuspid valve regurgitation is mild to moderate. 7. The aortic valve is grossly normal. Aortic valve regurgitation is not visualized. No aortic stenosis is present. 8. The inferior vena cava is normal in size with greater than 50% respiratory variability, suggesting right atrial pressure of 3 mmHg.  FINDINGS Left Ventricle: Left ventricular ejection fraction, by estimation, is 55 to 60%. The left ventricle has normal function. The left ventricle has no regional wall motion abnormalities. The left ventricular internal cavity size was normal in size. There is mild left ventricular hypertrophy. Left ventricular diastolic parameters are  indeterminate.  Right Ventricle: The right ventricular size is normal. Right vetricular wall thickness was not well visualized. Right ventricular systolic function is mildly reduced. There is normal pulmonary artery systolic pressure. The tricuspid regurgitant velocity is 2.58 m/s, and with an assumed right atrial pressure of 3 mmHg, the estimated right ventricular systolic pressure is 29.6 mmHg.  Left Atrium: Left atrial size was mildly dilated.  Right Atrium: Right atrial size was mildly dilated.  Pericardium: Trivial pericardial effusion is present.  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 Mild to moderate mitral valve regurgitation. No evidence of mitral valve stenosis.  Tricuspid Valve: The tricuspid valve is grossly normal. Tricuspid valve regurgitation is mild to moderate. No evidence of tricuspid stenosis.  Aortic Valve: The aortic valve is grossly normal. Aortic valve regurgitation is not visualized. No aortic stenosis is present.  Pulmonic Valve: The pulmonic valve was grossly normal. Pulmonic valve regurgitation is mild. No evidence of pulmonic stenosis.  Aorta: The aortic root is normal in size  and structure.  Venous: The inferior vena cava is normal in size with greater than 50% respiratory variability, suggesting right atrial pressure of 3 mmHg.  IAS/Shunts: No atrial level shunt detected by color flow Doppler.   LEFT VENTRICLE PLAX 2D LVIDd:         3.70 cm LVIDs:         2.60 cm LV PW:         1.00 cm LV IVS:        1.10 cm LVOT diam:     1.70 cm LVOT Area:     2.27 cm   IVC IVC diam: 1.00 cm  LEFT ATRIUM             Index        RIGHT ATRIUM           Index LA diam:        4.10 cm 2.66 cm/m   RA Area:     14.70 cm LA Vol (A2C):   48.6 ml 31.55 ml/m  RA Volume:   33.60 ml  21.81 ml/m LA Vol (A4C):   50.3 ml 32.65 ml/m LA Biplane Vol: 49.7 ml 32.26 ml/m  AORTA Ao Root diam: 2.90 cm Ao Asc diam:  3.30  cm  TRICUSPID VALVE TR Peak grad:   26.6 mmHg TR Vmax:        258.00 cm/s  SHUNTS Systemic Diam: 1.70 cm  Soyla Merck MD Electronically signed by Soyla Merck MD Signature Date/Time: 04/06/2021/12:32:50 PM    Final          ______________________________________________________________________________________________      Risk Assessment/Calculations  CHA2DS2-VASc Score = 5   This indicates a 7.2% annual risk of stroke. The patient's score is based upon: CHF History: 0 HTN History: 1 Diabetes History: 0 Stroke History: 0 Vascular Disease History: 1 Age Score: 2 Gender Score: 1            Physical Exam VS:  BP 138/82   Pulse 65   Ht 5' 2 (1.575 m)   Wt 104 lb 6.4 oz (47.4 kg)   SpO2 97%   BMI 19.10 kg/m        Wt Readings from Last 3 Encounters:  02/02/24 104 lb 6.4 oz (47.4 kg)  01/27/24 103 lb 9.9 oz (47 kg)  01/17/24 104 lb (47.2 kg)    GEN: Well nourished, well developed in no acute distress NECK: No JVD; No carotid bruits CARDIAC: Irregularly irregular, no murmurs, rubs, gallops RESPIRATORY: Occasional crackles in bilateral bases.  Lung is largely clear. ABDOMEN: Soft, non-tender, non-distended EXTREMITIES:  No edema; No deformity   ASSESSMENT AND PLAN  Chest pain: Recently admitted for NSTEMI.  Serial troponin up to more than 600.  Patient opted to continue with medication therapy rather than invasive study.  Will obtain echocardiogram to reassess ejection fraction.  She has not had any further chest discomfort since she left the hospital.  EKG does show sinus rhythm with diffuse ST downsloping.  Permanent atrial fibrillation: Based on prior report, she has been in A-fib since around 2000.  She has been on digoxin  and Xarelto .  Recent digoxin  level was subtherapeutic.  Heart rate is very well-controlled on the current dosage.  Hyperlipidemia: New LDL goal should be less than 70.  Will increase atorvastatin  to 20 mg daily.  Obtain CMP  and a fasting lipid panel in 4 weeks.  Mitral valve prolapse: Seen on previous echocardiogram, will reassess on this echocardiogram  Dispo: Follow-up with me in 6 weeks and follow-up with Dr. Barbaraann in 4 months.  Signed, Scot Ford, PA

## 2024-02-02 NOTE — Patient Instructions (Addendum)
 Thank you for choosing [facility]!     Medication Instructions:  Increase the Atorvastatin  to 20mg  daily. Take one tablet by mouth daily.  *If you need a refill on your cardiac medications before your next appointment, please call your pharmacy*   Lab Work: In one month, return for fasting labs.....................SABRA LIPID, CMET  If you have labs (blood work) drawn today and your tests are completely normal, you will receive your results only by: MyChart Message (if you have MyChart) OR A paper copy in the mail If you have any lab test that is abnormal or we need to change your treatment, we will call you to review the results.   Testing/Procedures: Your physician has requested that you have an echocardiogram. Echocardiography is a painless test that uses sound waves to create images of your heart. It provides your doctor with information about the size and shape of your heart and how well your heart's chambers and valves are working. This procedure takes approximately one hour. There are no restrictions for this procedure. Please do NOT wear cologne, perfume, aftershave, or lotions (deodorant is allowed). Please arrive 15 minutes prior to your appointment time.  Please note: We ask at that you not bring children with you during ultrasound (echo/ vascular) testing. Due to room size and safety concerns, children are not allowed in the ultrasound rooms during exams. Our front office staff cannot provide observation of children in our lobby area while testing is being conducted. An adult accompanying a patient to their appointment will only be allowed in the ultrasound room at the discretion of the ultrasound technician under special circumstances. We apologize for any inconvenience.   Your next appointment:   6 week(s)   Provider:   Hao Meng, PA-C  Then, [name], MD will plan to see you again in 4 month(s).     Follow-Up: At [facility], you and your health  needs are our priority.  As part of our continuing mission to provide you with exceptional heart care, we have created designated Provider Care Teams.  These Care Teams include your primary Cardiologist (physician) and Advanced Practice Providers (APPs -  Physician Assistants and Nurse Practitioners) who all work together to provide you with the care you need, when you need it. We recommend signing up for the patient portal called MyChart.  Sign up information is provided on this After Visit Summary.  MyChart is used to connect with patients for Virtual Visits (Telemedicine).  Patients are able to view lab/test results, encounter notes, upcoming appointments, etc.  Non-urgent messages can be sent to your provider as well.   To learn more about what you can do with MyChart, go to [email or web address].

## 2024-02-02 NOTE — Telephone Encounter (Signed)
 Just FYI--Office visit notes were sent to Margarete Plough, instead of PCP. Gastro sent the notes back.

## 2024-02-03 ENCOUNTER — Other Ambulatory Visit: Payer: Self-pay

## 2024-02-03 ENCOUNTER — Emergency Department (HOSPITAL_COMMUNITY)
Admission: EM | Admit: 2024-02-03 | Discharge: 2024-02-03 | Disposition: A | Discharge status: Home / Self Care | Attending: Emergency Medicine | Admitting: Emergency Medicine

## 2024-02-03 ENCOUNTER — Emergency Department (HOSPITAL_COMMUNITY)

## 2024-02-03 DIAGNOSIS — W06XXXA Fall from bed, initial encounter: Secondary | ICD-10-CM | POA: Insufficient documentation

## 2024-02-03 DIAGNOSIS — Y92129 Unspecified place in nursing home as the place of occurrence of the external cause: Secondary | ICD-10-CM | POA: Insufficient documentation

## 2024-02-03 DIAGNOSIS — I110 Hypertensive heart disease with heart failure: Secondary | ICD-10-CM | POA: Diagnosis not present

## 2024-02-03 DIAGNOSIS — S6992XA Unspecified injury of left wrist, hand and finger(s), initial encounter: Secondary | ICD-10-CM | POA: Diagnosis present

## 2024-02-03 DIAGNOSIS — Z79899 Other long term (current) drug therapy: Secondary | ICD-10-CM | POA: Insufficient documentation

## 2024-02-03 DIAGNOSIS — F0390 Unspecified dementia without behavioral disturbance: Secondary | ICD-10-CM | POA: Diagnosis not present

## 2024-02-03 DIAGNOSIS — W19XXXA Unspecified fall, initial encounter: Secondary | ICD-10-CM

## 2024-02-03 DIAGNOSIS — Z7982 Long term (current) use of aspirin: Secondary | ICD-10-CM | POA: Insufficient documentation

## 2024-02-03 DIAGNOSIS — S60222A Contusion of left hand, initial encounter: Secondary | ICD-10-CM | POA: Insufficient documentation

## 2024-02-03 DIAGNOSIS — I5032 Chronic diastolic (congestive) heart failure: Secondary | ICD-10-CM | POA: Diagnosis not present

## 2024-02-03 DIAGNOSIS — S0990XA Unspecified injury of head, initial encounter: Secondary | ICD-10-CM | POA: Insufficient documentation

## 2024-02-03 NOTE — ED Provider Notes (Signed)
 Lindsay Rush Provider Note   CSN: [id number] Arrival date & time: 02/03/24  [id number]     Patient presents with: Lindsay Rush   Lindsay Rush is a 89 y.o. female.  Patient with past medical history significant for mild cognitive impairment, dementia, atrial fibrillation, chronic diastolic CHF, hypertension presents to the emergency department via EMS from an assisted living facility due to an apparent fall.  The patient told staff that she rolled over in bed and rolled to the ground around midnight.  She denies hitting her head and denies loss of conscious. Her complaint is left hand pain. She believes that her hand hit the floor when she rolled over in bed. During my assessment she denies falling other than hitting her hand     Fall       Prior to Admission medications   Medication Sig Start Date End Date Taking? Authorizing Provider  ACETAMINOPHEN  EXTRA STRENGTH 500 MG tablet Take 1,000 mg by mouth 3 (three) times daily. 07/28/21   [provider]  aspirin  EC 81 MG tablet Take 1 tablet (81 mg total) by mouth daily. Swallow whole. 01/29/24   Georgina Basket, MD  atorvastatin  (LIPITOR) 20 MG tablet Take 1 tablet (20 mg total) by mouth daily. 02/02/24 05/02/24  Meng, Hao, PA  cyanocobalamin (VITAMIN B12) 1000 MCG tablet Take 1,000 mcg by mouth daily.    [provider]  diclofenac  Sodium (VOLTAREN ) 1 % GEL Apply 1 Application topically 4 (four) times daily.    [provider]  digoxin  (LANOXIN ) 0.125 MG tablet Take 1 tablet (0.125 mg total) by mouth daily. 04/08/21 02/02/24  Shona Terry SAILOR, DO  docusate sodium  (COLACE) 100 MG capsule Take 100 mg by mouth every Monday, Wednesday, and Friday.    [provider]  donepezil  (ARICEPT ) 10 MG tablet Take 1 tablet (10 mg total) by mouth at bedtime. 01/17/24   Sater, Charlie LABOR, MD  escitalopram  (LEXAPRO ) 10 MG tablet Take 1 tablet (10 mg total) by mouth daily. Patient taking  differently: Take 15 mg by mouth daily. 06/10/23   Sater, Charlie LABOR, MD  ezetimibe  (ZETIA ) 10 MG tablet Take 10 mg by mouth at bedtime.    [provider]  famotidine (PEPCID) 40 MG tablet Take 40 mg by mouth daily.    [provider]  Fluticasone -Umeclidin-Vilant (TRELEGY ELLIPTA) 200-62.5-25 MCG/ACT AEPB Inhale 1 puff into the lungs daily.    [provider]  furosemide  (LASIX ) 40 MG tablet Take 1 tablet (40 mg total) by mouth daily. 04/07/21 02/02/24  Shona Terry SAILOR, DO  isosorbide  mononitrate (IMDUR ) 30 MG 24 hr tablet Take 1 tablet (30 mg total) by mouth daily. 01/29/24   Georgina Basket, MD  lisinopril (ZESTRIL) 2.5 MG tablet Take 2.5 mg by mouth daily.    [provider]  loratadine (CLARITIN) 10 MG tablet Take 10 mg by mouth daily.    [provider]  methocarbamol (ROBAXIN) 500 MG tablet Take 250 mg by mouth in the morning and at bedtime.    [provider]  metoprolol  succinate (TOPROL -XL) 25 MG 24 hr tablet Take 1/2 tablets (12.5 mg total) by mouth daily. 01/29/24   Georgina Basket, MD  Rivaroxaban  (XARELTO ) 15 MG TABS tablet Take 15 mg by mouth daily.    [provider]  thyroid  (ARMOUR) 30 MG tablet Take 30 mg by mouth daily before breakfast.    [provider]  traZODone  (DESYREL ) 50 MG tablet Take 25 mg by  mouth at bedtime.    [provider]  VITAMIN D PO Take 1 tablet by mouth daily.    [provider]    Allergies: Codeine    Review of Systems  Updated Vital Signs BP (!) 148/74   Pulse 67   Temp (!) 97.3 F (36.3 C) (Temporal)   Resp (!) 24   Ht 5' 2 (1.575 m)   Wt 49.4 kg   SpO2 100%   BMI 19.94 kg/m   Physical Exam Vitals and nursing note reviewed.  HENT:     Head: Normocephalic and atraumatic.     Comments: No obvious deformity or trauma noted to patient's head Eyes:     Extraocular Movements: Extraocular movements intact.     Conjunctiva/sclera: Conjunctivae normal.      Pupils: Pupils are equal, round, and reactive to light.  Neck:     Comments: Patient initially in c-collar.  No cervical spine tenderness.  Patient with no pain with active range of motion. Cardiovascular:     Rate and Rhythm: Normal rate.  Pulmonary:     Effort: Pulmonary effort is normal. No respiratory distress.     Breath sounds: Normal breath sounds.  Abdominal:     Palpations: Abdomen is soft.     Tenderness: There is no abdominal tenderness.  Musculoskeletal:        General: Swelling, tenderness and signs of injury present. No deformity. Normal range of motion.     Cervical back: Normal range of motion and neck supple.     Comments: Grossly normal range of motion of bilateral upper and lower extremities.  Patient does have hematoma to the dorsal surface of the left hand near the 2nd and 3rd MCP  Skin:    General: Skin is dry.  Neurological:     Mental Status: She is alert.  Psychiatric:        Speech: Speech normal.        Behavior: Behavior normal.     (all labs ordered are listed, but only abnormal results are displayed) Labs Reviewed - No data to display  EKG: None  Radiology: CT Head Wo Contrast Result Date: 02/03/2024 EXAM: CT HEAD AND CERVICAL SPINE 02/03/2024 02:15:34 AM TECHNIQUE: CT of the head and cervical spine was performed without the administration of intravenous contrast. Multiplanar reformatted images are provided for review. Automated exposure control, iterative reconstruction, and/or weight based adjustment of the mA/kV was utilized to reduce the radiation dose to as low as reasonably achievable. COMPARISON: 10/29/2021 CLINICAL HISTORY: Head trauma, minor (Age >= 65y). Pt BIB [facility] from [facility] c/o a fall. Pt stated she rolled over in bed and rolled to the ground around 12am. Unknown LOC. Pt is forgetful at baseline. Upon assessment, pt has hematoma on left hand. Pt does take thinners. FINDINGS: CT HEAD BRAIN AND VENTRICLES: No acute intracranial  hemorrhage. No mass effect or midline shift. No abnormal extra-axial fluid collection. No evidence of acute infarct. No hydrocephalus. Global cortical and central atrophy with secondary ventricular prominence. Subcortical and periventricular small vessel ischemic changes. ORBITS: No acute abnormality. SINUSES AND MASTOIDS: No acute abnormality. SOFT TISSUES AND SKULL: No acute skull fracture. No acute soft tissue abnormality. VASCULATURE: Intracranial atherosclerosis. CT CERVICAL SPINE BONES AND ALIGNMENT: No acute fracture or traumatic malalignment. 3 mm anterolisthesis of C4 on C5. DEGENERATIVE CHANGES: Mild degenerative changes of the mid / lower cervical spine, most prominent at C6-7. SOFT TISSUES: No prevertebral soft tissue swelling. IMPRESSION: 1. No acute intracranial abnormality.  2. No acute traumatic injury to the cervical spine. Electronically signed by: Pinkie Pebbles MD 02/03/2024 02:26 AM EDT RP Workstation: [id number]   CT Cervical Spine Wo Contrast Result Date: 02/03/2024 EXAM: CT HEAD AND CERVICAL SPINE 02/03/2024 02:15:34 AM TECHNIQUE: CT of the head and cervical spine was performed without the administration of intravenous contrast. Multiplanar reformatted images are provided for review. Automated exposure control, iterative reconstruction, and/or weight based adjustment of the mA/kV was utilized to reduce the radiation dose to as low as reasonably achievable. COMPARISON: 10/29/2021 CLINICAL HISTORY: Head trauma, minor (Age >= 65y). Pt BIB [facility] from [facility] c/o a fall. Pt stated she rolled over in bed and rolled to the ground around 12am. Unknown LOC. Pt is forgetful at baseline. Upon assessment, pt has hematoma on left hand. Pt does take thinners. FINDINGS: CT HEAD BRAIN AND VENTRICLES: No acute intracranial hemorrhage. No mass effect or midline shift. No abnormal extra-axial fluid collection. No evidence of acute infarct. No hydrocephalus. Global cortical and central atrophy  with secondary ventricular prominence. Subcortical and periventricular small vessel ischemic changes. ORBITS: No acute abnormality. SINUSES AND MASTOIDS: No acute abnormality. SOFT TISSUES AND SKULL: No acute skull fracture. No acute soft tissue abnormality. VASCULATURE: Intracranial atherosclerosis. CT CERVICAL SPINE BONES AND ALIGNMENT: No acute fracture or traumatic malalignment. 3 mm anterolisthesis of C4 on C5. DEGENERATIVE CHANGES: Mild degenerative changes of the mid / lower cervical spine, most prominent at C6-7. SOFT TISSUES: No prevertebral soft tissue swelling. IMPRESSION: 1. No acute intracranial abnormality. 2. No acute traumatic injury to the cervical spine. Electronically signed by: Pinkie Pebbles MD 02/03/2024 02:26 AM EDT RP Workstation: [id number]   DG Hand Complete Left Result Date: 02/03/2024 EXAM: 3 or more VIEW(S) XRAY OF THE left HAND COMPARISON: None available. CLINICAL HISTORY: Left hand pain/swelling post fall. Level 2 fall on thinners, hematoma to the left hand FINDINGS: BONES AND JOINTS: Normal alignment. No acute fracture or dislocation. Polyarticular degenerative changes are seen throughout the left hand and wrist, most severe involving the DIP joints of the second and third digits, IP joint of the thumb, second and third MCP joints, hand at the base of the thumb at the first carpometacarpal joint. SOFT TISSUES: Mild soft tissue swelling noted dorsal to the metacarpal heads. IMPRESSION: 1. No acute osseous abnormality. 2. Polyarticular degenerative changes throughout the left hand and wrist, most severe involving the DIP joints of the second and third digits, IP joint of the thumb, second and third MCP joints, and the base of the thumb at the first carpometacarpal joint. 3. Mild soft tissue swelling dorsal to the metacarpal heads. Electronically signed by: Dorethia Molt MD 02/03/2024 02:11 AM EDT RP Workstation: [id number]     Procedures   Medications Ordered in the ED - No  data to display                                  Medical Decision Making Amount and/or Complexity of Data Reviewed Radiology: ordered.   This patient presents to the ED for concern of injuries post fall, this involves an extensive number of treatment options, and is a complaint that carries with it a high risk of complications and morbidity.  The differential diagnosis includes intracranial abnormality, fracture, dislocation, soft tissue injury, others   Co morbidities / Chronic conditions that complicate the patient evaluation  Xarelto  usage   Additional history obtained:  Additional history obtained from EMR and  EMS    Imaging Studies ordered:  I ordered imaging studies including plain films of the left hand, CT scans of the head and cervical spine I independently visualized and interpreted imaging which showed no acute findings I agree with the radiologist interpretation   Test / Admission - Considered:  Patient with no acute findings on imaging.  Patient's mental status is at baseline for the patient.  She describes a mechanical fall.  No indication for lab work at this time.  Patient's chief complaint continues to be the left hand which has a small hematoma but no fracture or dislocation noted on imaging.  No indication for further emergent workup or admission.  Patient may use Tylenol  for pain control as anti-inflammatories would be contraindicated with her current Xarelto  usage.  Patient to follow-up with her primary care team as needed for further management.      Final diagnoses:  Fall, initial encounter  Hematoma of left hand    ED Discharge Orders     None          Logan Ubaldo KATHEE DEVONNA 02/03/24 [id number]    Bari Charmaine FALCON, MD 02/03/24 [phone]

## 2024-02-03 NOTE — Progress Notes (Signed)
 Orthopedic Tech Progress Note Patient Details:  Lindsay Rush 13-Jul-1934 [id number]  Patient ID: Lindsay Rush, female   DOB: May 14, 1934, 89 y.o.   MRN: [id number] Level II trauma, no ortho tech orders at this time.   Lindsay Rush 02/03/2024, 2:02 AM

## 2024-02-03 NOTE — ED Notes (Signed)
 Pt transported to CT ?

## 2024-02-03 NOTE — ED Triage Notes (Addendum)
 Pt BIB [facility] from [facility] c/o a fall. Pt stated she rolled over in bed and rolled to the ground around 12am. Unknown LOC. Pt is forgetful at baseline. Upon assessment, pt has hematoma on left hand. Pt does take thinners.

## 2024-02-03 NOTE — Discharge Instructions (Signed)
 Your imaging this morning was reassuring with no fracture, dislocation, or other acute abnormality noted.  You do have swelling to the left hand.  I recommend placing ice over this area.  You may use Tylenol  for pain control.  Follow-up with your primary care provider.  If you develop any life-threatening symptoms return to the emergency department.

## 2024-02-08 ENCOUNTER — Other Ambulatory Visit (HOSPITAL_COMMUNITY): Payer: Self-pay

## 2024-02-11 ENCOUNTER — Emergency Department (HOSPITAL_COMMUNITY)

## 2024-02-11 ENCOUNTER — Emergency Department (HOSPITAL_COMMUNITY)
Admission: EM | Admit: 2024-02-11 | Discharge: 2024-02-11 | Disposition: A | Discharge status: Other Acute Inpatient Hospital

## 2024-02-11 ENCOUNTER — Other Ambulatory Visit: Payer: Self-pay

## 2024-02-11 ENCOUNTER — Encounter (HOSPITAL_COMMUNITY): Payer: Self-pay

## 2024-02-11 DIAGNOSIS — S40022A Contusion of left upper arm, initial encounter: Secondary | ICD-10-CM | POA: Diagnosis not present

## 2024-02-11 DIAGNOSIS — W0110XA Fall on same level from slipping, tripping and stumbling with subsequent striking against unspecified object, initial encounter: Secondary | ICD-10-CM | POA: Insufficient documentation

## 2024-02-11 DIAGNOSIS — Y9302 Activity, running: Secondary | ICD-10-CM | POA: Insufficient documentation

## 2024-02-11 DIAGNOSIS — R519 Headache, unspecified: Secondary | ICD-10-CM | POA: Diagnosis present

## 2024-02-11 DIAGNOSIS — M542 Cervicalgia: Secondary | ICD-10-CM | POA: Diagnosis not present

## 2024-02-11 DIAGNOSIS — Z7982 Long term (current) use of aspirin: Secondary | ICD-10-CM | POA: Insufficient documentation

## 2024-02-11 DIAGNOSIS — Z7901 Long term (current) use of anticoagulants: Secondary | ICD-10-CM | POA: Diagnosis not present

## 2024-02-11 DIAGNOSIS — S066X0A Traumatic subarachnoid hemorrhage without loss of consciousness, initial encounter: Secondary | ICD-10-CM | POA: Insufficient documentation

## 2024-02-11 DIAGNOSIS — Y9289 Other specified places as the place of occurrence of the external cause: Secondary | ICD-10-CM | POA: Insufficient documentation

## 2024-02-11 DIAGNOSIS — I609 Nontraumatic subarachnoid hemorrhage, unspecified: Secondary | ICD-10-CM

## 2024-02-11 DIAGNOSIS — I4891 Unspecified atrial fibrillation: Secondary | ICD-10-CM | POA: Insufficient documentation

## 2024-02-11 MED ORDER — FENTANYL CITRATE PF 50 MCG/ML IJ SOSY
25.0000 ug | PREFILLED_SYRINGE | Freq: Once | INTRAMUSCULAR | Status: AC
  Administered 2024-02-11: 25 ug via INTRAVENOUS
  Filled 2024-02-11: qty 1

## 2024-02-11 MED ORDER — ACETAMINOPHEN 325 MG PO TABS
650.0000 mg | ORAL_TABLET | Freq: Once | ORAL | Status: AC
  Administered 2024-02-11: 650 mg via ORAL
  Filled 2024-02-11: qty 2

## 2024-02-11 MED ORDER — TETANUS-DIPHTH-ACELL PERTUSSIS 5-2-15.5 LF-MCG/0.5 IM SUSP
0.5000 mL | Freq: Once | INTRAMUSCULAR | Status: DC
  Filled 2024-02-11: qty 0.5

## 2024-02-11 NOTE — ED Notes (Signed)
 Pt ambulatory to [facility], daughter picked up pt from lobby with paperwork and all belongings. LDA removed, VSS

## 2024-02-11 NOTE — Progress Notes (Signed)
 Orthopedic Tech Progress Note Patient Details:  Lindsay Rush Dec 30, 1934 [id number]  Patient ID: [name], female   DOB: Jul 05, 1934, 89 y.o.   MRN: [id number] Level 2 trauma. Adine MARLA Blush 02/11/2024, 9:30 AM

## 2024-02-11 NOTE — ED Triage Notes (Signed)
 Pt from [facility] via [facility]s with CC of trip and fall while running down the hall. Pt is on  Xarelto . Pt denies loc. Pt hit had and has hematoma to left eye. Pt CO left arm pain. Pt has GCS of 15.

## 2024-02-11 NOTE — ED Notes (Signed)
 CCMD called to place the patient on cardiac monitoring services.

## 2024-02-11 NOTE — ED Notes (Addendum)
 Pt daughter on the way to pick patient up. Both pt and daughter feel comfortable transporting via POV

## 2024-02-11 NOTE — Discharge Instructions (Addendum)
 Your CT scan showed a small bleed in your brain.  This appears stable on the repeat head CT scan.  Our neurosurgeon recommended that you hold your xarelto  for 5 days.  Please call this and follow-up with your doctor.  Return to the ER for worsening symptoms.

## 2024-02-11 NOTE — ED Provider Notes (Signed)
 [place] EMERGENCY DEPARTMENT AT [facility] Provider Note   CSN: [id number] Arrival date & time: 02/11/24  9147     Patient presents with: [place]   Lindsay Rush is a 89 y.o. female.   89 year old female with past medical history of atrial fibrillation on Xarelto  presenting to the emergency department today after a fall.  The patient fell and did hit her head.  Unsure if she lost consciousness but does not think so.  The patient has been having left-sided headache since then.  Denies any nausea or vomiting.  She is also complaining of pain in her left shoulder.  Denies any other injuries.  The patient was brought to the emergency department at time further evaluation.  This was a mechanical fall.        Prior to Admission medications   Medication Sig Start Date End Date Taking? Authorizing Provider  ACETAMINOPHEN  EXTRA STRENGTH 500 MG tablet Take 1,000 mg by mouth 3 (three) times daily. 07/28/21   [provider]  aspirin  EC 81 MG tablet Take 1 tablet (81 mg total) by mouth daily. Swallow whole. 01/29/24   Georgina Basket, MD  atorvastatin  (LIPITOR) 20 MG tablet Take 1 tablet (20 mg total) by mouth daily. 02/02/24 05/02/24  Meng, Hao, PA  cyanocobalamin (VITAMIN B12) 1000 MCG tablet Take 1,000 mcg by mouth daily.    [provider]  diclofenac  Sodium (VOLTAREN ) 1 % GEL Apply 1 Application topically 4 (four) times daily.    [provider]  digoxin  (LANOXIN ) 0.125 MG tablet Take 1 tablet (0.125 mg total) by mouth daily. 04/08/21 02/02/24  Shona Terry SAILOR, DO  docusate sodium  (COLACE) 100 MG capsule Take 100 mg by mouth every Monday, Wednesday, and Friday.    [provider]  donepezil  (ARICEPT ) 10 MG tablet Take 1 tablet (10 mg total) by mouth at bedtime. 01/17/24   Sater, Charlie LABOR, MD  escitalopram  (LEXAPRO ) 10 MG tablet Take 1 tablet (10 mg total) by mouth daily. Patient taking differently: Take 15 mg by mouth daily. 06/10/23   Sater, Charlie LABOR, MD  ezetimibe  (ZETIA ) 10 MG tablet Take 10 mg by mouth at bedtime.    [provider]  famotidine (PEPCID) 40 MG tablet Take 40 mg by mouth daily.    [provider]  Fluticasone -Umeclidin-Vilant (TRELEGY ELLIPTA) 200-62.5-25 MCG/ACT AEPB Inhale 1 puff into the lungs daily.    [provider]  furosemide  (LASIX ) 40 MG tablet Take 1 tablet (40 mg total) by mouth daily. 04/07/21 02/02/24  Shona Terry SAILOR, DO  isosorbide  mononitrate (IMDUR ) 30 MG 24 hr tablet Take 1 tablet (30 mg total) by mouth daily. 01/29/24   Georgina Basket, MD  lisinopril (ZESTRIL) 2.5 MG tablet Take 2.5 mg by mouth daily.    [provider]  loratadine (CLARITIN) 10 MG tablet Take 10 mg by mouth daily.    [provider]  methocarbamol (ROBAXIN) 500 MG tablet Take 250 mg by mouth in the morning and at bedtime.    [provider]  metoprolol  succinate (TOPROL -XL) 25 MG 24 hr tablet Take 1/2 tablets (12.5 mg total) by mouth daily. 01/29/24   Georgina Basket, MD  Rivaroxaban  (XARELTO ) 15 MG TABS tablet Take 15 mg by mouth daily.    [provider]  thyroid  (ARMOUR) 30 MG tablet Take 30 mg by mouth daily before breakfast.    [provider]  traZODone  (DESYREL ) 50 MG tablet Take 25 mg by mouth at bedtime.  [provider]  VITAMIN D PO Take 1 tablet by mouth daily.    [provider]    Allergies: Codeine    Review of Systems  Musculoskeletal:  Positive for arthralgias.  Neurological:  Positive for headaches.  All other systems reviewed and are negative.   Updated Vital Signs BP (!) 140/72   Pulse 64   Temp 98.4 F (36.9 C) (Oral)   Resp (!) 23   Ht 5' 2 (1.575 m)   Wt 49 kg   SpO2 99%   BMI 19.76 kg/m   Physical Exam Vitals and nursing note reviewed.   Gen: NAD Eyes: PERRL, EOMI HEENT: no oropharyngeal swelling Neck: trachea midline, + cervical spine tenderness, no stepoffs or deformities Resp: clear to auscultation  bilaterally Card: RRR, no murmurs, rubs, or gallops Abd: nontender, nondistended, no seatbelt sign Extremities: no calf tenderness, no edema, tender over the left proximal humerus with some ecchymosis noted but no obvious deformity MSK: no thoracic spinal tenderness, no lumbar spinal tenderness, no step-offs or deformities Vascular: 2+ radial pulses bilaterally, 2+ DP pulses bilaterally Neuro: Alert and oriented x 3, equal strength sensation throughout bilateral upper and lower extremities Skin: no rashes   (all labs ordered are listed, but only abnormal results are displayed) Labs Reviewed  CBC  BASIC METABOLIC PANEL WITH GFR    EKG: None  Radiology: CT HEAD WO CONTRAST Result Date: 02/11/2024 CLINICAL DATA:  Fall while running. EXAM: CT HEAD WITHOUT CONTRAST CT MAXILLOFACIAL WITHOUT CONTRAST CT CERVICAL SPINE WITHOUT CONTRAST TECHNIQUE: Multidetector CT imaging of the head, cervical spine, and maxillofacial structures were performed using the standard protocol without intravenous contrast. Multiplanar CT image reconstructions of the cervical spine and maxillofacial structures were also generated. RADIATION DOSE REDUCTION: This exam was performed according to the departmental dose-optimization program which includes automated exposure control, adjustment of the mA and/or kV according to patient size and/or use of iterative reconstruction technique. COMPARISON:  Head/cervical spine CT 02/03/2024 and 10/29/2021 FINDINGS: CT HEAD FINDINGS Brain: Ventricles, cisterns and other CSF spaces are normal per there is mild chronic ischemic microvascular disease. There is no mass, mass effect or shift of midline structures. Traumatic Brain Injury Risk Stratification Skull Fracture: No - Low/mBIG 1 Subdural Hematoma (SDH): No - Low Subarachnoid Hemorrhage [facility]): Single 1-1.5 cm focus of acute subarachnoid hemorrhage over the left frontal lobe. Epidural Hematoma (EDH): No - Low/mBIG 1 Cerebral contusion,  intra-axial, intraparenchymal Hemorrhage (IPH): No Intraventricular Hemorrhage (IVH): No - Low/mBIG 1 Midline Shift > 1mm or Edema/effacement of sulci/vents: No - Low/mBIG 1 ---------------------------------------------------- Vascular: No hyperdense vessel or unexpected calcification. Skull: Normal. Negative for fracture or focal lesion. Other: Moderate left frontal scalp contusion. CT MAXILLOFACIAL FINDINGS Osseous: No acute facial bone fracture. Orbits: Globes and retrobulbar spaces are normal. Sinuses: There is a sinuses are well aerated and clear. Soft tissues: Moderate left supraorbital soft tissue swelling continuous with left frontal scalp contusion. Mild soft tissue swelling adjacent to the body of the left mandible. CT CERVICAL SPINE FINDINGS Alignment: Subtle stable 1-2 mm anterior subluxation of C4 on C5 likely degenerative in nature. Skull base and vertebrae: Vertebral body heights are normal. There is moderate spondylosis throughout the cervical spine to include uncovertebral joint spurring and facet arthropathy. The atlantoaxial articulations are normal. No acute fracture. Mild bilateral neural foraminal narrowing at the C5-6 level. Soft tissues and spinal canal: No prevertebral fluid or swelling. No visible canal hematoma. Disc levels: Disc space narrowing at the C5-6 and C6-7 levels as  well as the C3-4 level. Upper chest: No acute findings. Other: None. IMPRESSION: 1. Single 1-1.5 cm focus of acute subarachnoid hemorrhage over the left frontal lobe. No mass effect or midline shift. Compatible with minor TBI BIG 1 categorization. 2. Moderate left frontal scalp contusion. 3. No acute facial bone fracture. 4. No acute cervical spine injury. 5. Moderate spondylosis throughout the cervical spine with disc disease at the C3-4, C5-6 and C6-7 levels. Mild bilateral neural foraminal narrowing at the C5-6 level. Critical Value/emergent results were called by telephone at the time of interpretation on  02/11/2024 at 9:47 am to provider Quantel Mcinturff , who verbally acknowledged these results. Electronically Signed   By: Toribio Agreste M.D.   On: 02/11/2024 09:48   CT MAXILLOFACIAL WO CONTRAST Result Date: 02/11/2024 CLINICAL DATA:  Fall while running. EXAM: CT HEAD WITHOUT CONTRAST CT MAXILLOFACIAL WITHOUT CONTRAST CT CERVICAL SPINE WITHOUT CONTRAST TECHNIQUE: Multidetector CT imaging of the head, cervical spine, and maxillofacial structures were performed using the standard protocol without intravenous contrast. Multiplanar CT image reconstructions of the cervical spine and maxillofacial structures were also generated. RADIATION DOSE REDUCTION: This exam was performed according to the departmental dose-optimization program which includes automated exposure control, adjustment of the mA and/or kV according to patient size and/or use of iterative reconstruction technique. COMPARISON:  Head/cervical spine CT 02/03/2024 and 10/29/2021 FINDINGS: CT HEAD FINDINGS Brain: Ventricles, cisterns and other CSF spaces are normal per there is mild chronic ischemic microvascular disease. There is no mass, mass effect or shift of midline structures. Traumatic Brain Injury Risk Stratification Skull Fracture: No - Low/mBIG 1 Subdural Hematoma (SDH): No - Low Subarachnoid Hemorrhage [facility]): Single 1-1.5 cm focus of acute subarachnoid hemorrhage over the left frontal lobe. Epidural Hematoma (EDH): No - Low/mBIG 1 Cerebral contusion, intra-axial, intraparenchymal Hemorrhage (IPH): No Intraventricular Hemorrhage (IVH): No - Low/mBIG 1 Midline Shift > 1mm or Edema/effacement of sulci/vents: No - Low/mBIG 1 ---------------------------------------------------- Vascular: No hyperdense vessel or unexpected calcification. Skull: Normal. Negative for fracture or focal lesion. Other: Moderate left frontal scalp contusion. CT MAXILLOFACIAL FINDINGS Osseous: No acute facial bone fracture. Orbits: Globes and retrobulbar spaces are normal. Sinuses:  There is a sinuses are well aerated and clear. Soft tissues: Moderate left supraorbital soft tissue swelling continuous with left frontal scalp contusion. Mild soft tissue swelling adjacent to the body of the left mandible. CT CERVICAL SPINE FINDINGS Alignment: Subtle stable 1-2 mm anterior subluxation of C4 on C5 likely degenerative in nature. Skull base and vertebrae: Vertebral body heights are normal. There is moderate spondylosis throughout the cervical spine to include uncovertebral joint spurring and facet arthropathy. The atlantoaxial articulations are normal. No acute fracture. Mild bilateral neural foraminal narrowing at the C5-6 level. Soft tissues and spinal canal: No prevertebral fluid or swelling. No visible canal hematoma. Disc levels: Disc space narrowing at the C5-6 and C6-7 levels as well as the C3-4 level. Upper chest: No acute findings. Other: None. IMPRESSION: 1. Single 1-1.5 cm focus of acute subarachnoid hemorrhage over the left frontal lobe. No mass effect or midline shift. Compatible with minor TBI BIG 1 categorization. 2. Moderate left frontal scalp contusion. 3. No acute facial bone fracture. 4. No acute cervical spine injury. 5. Moderate spondylosis throughout the cervical spine with disc disease at the C3-4, C5-6 and C6-7 levels. Mild bilateral neural foraminal narrowing at the C5-6 level. Critical Value/emergent results were called by telephone at the time of interpretation on 02/11/2024 at 9:47 am to provider Jacklynn Dehaas , who verbally  acknowledged these results. Electronically Signed   By: Toribio Agreste M.D.   On: 02/11/2024 09:48   CT CERVICAL SPINE WO CONTRAST Result Date: 02/11/2024 CLINICAL DATA:  Fall while running. EXAM: CT HEAD WITHOUT CONTRAST CT MAXILLOFACIAL WITHOUT CONTRAST CT CERVICAL SPINE WITHOUT CONTRAST TECHNIQUE: Multidetector CT imaging of the head, cervical spine, and maxillofacial structures were performed using the standard protocol without intravenous contrast.  Multiplanar CT image reconstructions of the cervical spine and maxillofacial structures were also generated. RADIATION DOSE REDUCTION: This exam was performed according to the departmental dose-optimization program which includes automated exposure control, adjustment of the mA and/or kV according to patient size and/or use of iterative reconstruction technique. COMPARISON:  Head/cervical spine CT 02/03/2024 and 10/29/2021 FINDINGS: CT HEAD FINDINGS Brain: Ventricles, cisterns and other CSF spaces are normal per there is mild chronic ischemic microvascular disease. There is no mass, mass effect or shift of midline structures. Traumatic Brain Injury Risk Stratification Skull Fracture: No - Low/mBIG 1 Subdural Hematoma (SDH): No - Low Subarachnoid Hemorrhage [facility]): Single 1-1.5 cm focus of acute subarachnoid hemorrhage over the left frontal lobe. Epidural Hematoma (EDH): No - Low/mBIG 1 Cerebral contusion, intra-axial, intraparenchymal Hemorrhage (IPH): No Intraventricular Hemorrhage (IVH): No - Low/mBIG 1 Midline Shift > 1mm or Edema/effacement of sulci/vents: No - Low/mBIG 1 ---------------------------------------------------- Vascular: No hyperdense vessel or unexpected calcification. Skull: Normal. Negative for fracture or focal lesion. Other: Moderate left frontal scalp contusion. CT MAXILLOFACIAL FINDINGS Osseous: No acute facial bone fracture. Orbits: Globes and retrobulbar spaces are normal. Sinuses: There is a sinuses are well aerated and clear. Soft tissues: Moderate left supraorbital soft tissue swelling continuous with left frontal scalp contusion. Mild soft tissue swelling adjacent to the body of the left mandible. CT CERVICAL SPINE FINDINGS Alignment: Subtle stable 1-2 mm anterior subluxation of C4 on C5 likely degenerative in nature. Skull base and vertebrae: Vertebral body heights are normal. There is moderate spondylosis throughout the cervical spine to include uncovertebral joint spurring and facet  arthropathy. The atlantoaxial articulations are normal. No acute fracture. Mild bilateral neural foraminal narrowing at the C5-6 level. Soft tissues and spinal canal: No prevertebral fluid or swelling. No visible canal hematoma. Disc levels: Disc space narrowing at the C5-6 and C6-7 levels as well as the C3-4 level. Upper chest: No acute findings. Other: None. IMPRESSION: 1. Single 1-1.5 cm focus of acute subarachnoid hemorrhage over the left frontal lobe. No mass effect or midline shift. Compatible with minor TBI BIG 1 categorization. 2. Moderate left frontal scalp contusion. 3. No acute facial bone fracture. 4. No acute cervical spine injury. 5. Moderate spondylosis throughout the cervical spine with disc disease at the C3-4, C5-6 and C6-7 levels. Mild bilateral neural foraminal narrowing at the C5-6 level. Critical Value/emergent results were called by telephone at the time of interpretation on 02/11/2024 at 9:47 am to provider Kestrel Mis , who verbally acknowledged these results. Electronically Signed   By: Toribio Agreste M.D.   On: 02/11/2024 09:48   DG Shoulder Left Port Result Date: 02/11/2024 CLINICAL DATA:  Fall with left shoulder pain. EXAM: LEFT SHOULDER COMPARISON:  None. FINDINGS: Mild degenerative changes of the left AC joint and glenohumeral joints. No evidence of acute fracture or dislocation. Minimal calcification over the expected region of the supraspinatus insertion site which may be due to mild calcific tendinitis. IMPRESSION: 1. No acute findings. 2. Mild degenerative changes of the left shoulder. Possible mild calcific tendinitis. Electronically Signed   By: Toribio Agreste M.D.   On:  02/11/2024 09:22   DG Chest Port 1 View Result Date: 02/11/2024 CLINICAL DATA:  Trauma/fall on blood thinners. EXAM: PORTABLE CHEST 1 VIEW COMPARISON:  01/27/2024, chest CT 01/28/2024 and CT 11/19/2022 FINDINGS: Lungs are adequately inflated without acute airspace consolidation or effusion. Stable subtle  diffuse interstitial prominence compatible with fibrotic changes seen on recent CT. Mild stable cardiomegaly. Remainder of the exam is unchanged. IMPRESSION: 1. No acute cardiopulmonary disease. 2. Mild chronic interstitial disease. 3. Stable mild cardiomegaly. Electronically Signed   By: Toribio Agreste M.D.   On: 02/11/2024 09:20     Procedures   Medications Ordered in the ED  Tdap (ADACEL) injection 0.5 mL (0.5 mLs Intramuscular Not Given 02/11/24 0926)  acetaminophen  (TYLENOL ) tablet 650 mg (650 mg Oral Given 02/11/24 0921)  fentaNYL (SUBLIMAZE) injection 25 mcg (25 mcg Intravenous Given 02/11/24 1022)  fentaNYL (SUBLIMAZE) injection 25 mcg (25 mcg Intravenous Given 02/11/24 1419)                                    Medical Decision Making 89 year old female with past medical history of atrial fibrillation on Xarelto  presenting to the emergency department today with headache after a mechanical fall at her nursing facility.  I will further evaluate the patient here with a CT scan of her head, maxillofacial bones, and cervical spine in addition to a chest x-ray and left shoulder x-ray for further evaluation for acute traumatic injuries.  Will give the patient Tylenol  for pain and update her tetanus.  I will reevaluate for ultimate disposition.  The patient's imaging studies show a very small subarachnoid hemorrhage on CT.  I did call and discussed this with Dr. Lanis.  Recommends repeat CT scanning and if this is negative the patient may be safely discharged.  Repeat CT scan pending at the time of signout.  CRITICAL CARE Performed by: Prentice JONELLE Medicus   Total critical care time: 40 minutes  Critical care time was exclusive of separately billable procedures and treating other patients.  Critical care was necessary to treat or prevent imminent or life-threatening deterioration.  Critical care was time spent personally by me on the following activities: development of treatment plan with  patient and/or surrogate as well as nursing, discussions with consultants, evaluation of patient's response to treatment, examination of patient, obtaining history from patient or surrogate, ordering and performing treatments and interventions, ordering and review of laboratory studies, ordering and review of radiographic studies, pulse oximetry and re-evaluation of patient's condition.   Amount and/or Complexity of Data Reviewed Labs: ordered. Radiology: ordered.  Risk OTC drugs. Prescription drug management.        Final diagnoses:  Subarachnoid hemorrhage [facility])    ED Discharge Orders     None          Medicus Prentice JONELLE, MD 02/11/24 1454

## 2024-02-11 NOTE — ED Notes (Signed)
 Patient transported to CT

## 2024-02-11 NOTE — ED Notes (Signed)
 Lindsay Rush with patient to CT 3

## 2024-02-11 NOTE — ED Notes (Incomplete)
 Trauma Response Nurse Documentation   Lindsay Rush is a 89 y.o. female arriving to [facility] ED via EMS  On Xarelto  (rivaroxaban ) daily. Trauma was activated as a Level 2 by Lindsay Feeling, RN - Charge nurse based on the following trauma criteria Elderly patients > 65 with head trauma on anti-coagulation (excluding ASA).  Patient cleared for CT by Dr. Ula. Pt transported to CT with trauma response nurse present to monitor. RN remained with the patient throughout their absence from the department for clinical observation.   GCS 15.  History   Past Medical History:  Diagnosis Date   Atrial fibrillation (HCC)    Chronic diastolic CHF (congestive heart failure) (HCC)    Chronic hypotension    COPD (chronic obstructive pulmonary disease) (HCC)    Dementia (HCC)    Fall    Hyperlipidemia    Hypertension    Hypothyroidism    Insomnia    MCI (mild cognitive impairment)    Thyrotoxicosis      Past Surgical History:  Procedure Laterality Date   ABDOMINAL HYSTERECTOMY     APPENDECTOMY     CHOLECYSTECTOMY     left knee replacement  12/2020       Initial Focused Assessment (If applicable, or please see trauma documentation): Airway - CLear  Breathing - Unlabored, lungs clear Circulation - hematoma above left eye, small superficial laceration, minimal bleeding noted GCS - 15 --    CT's Completed:   CT Head, CT Maxillofacial, and CT C-Spine   Interventions:   Xrays CT scans Tdap Tylenol   Plan for disposition:  {Trauma Dispo:26867}   Consults completed:  {Trauma Consults:26862} at ***.  Event Summary: Pt states she was running down the hall at her assisted living facility and tripped. Lives in regular assisted living area. A/O x 4, answers all questions appropriately. Hematoma noted above left eye, smal superficial laceration - bleeding controlled. Has old bruising to left shoulder and left hand.. Equal grips, able to raise both legs without pain.   Lindsay Rush  Trauma  Response RN  Please call TRN at [phone] for further assistance.

## 2024-02-22 ENCOUNTER — Telehealth: Payer: Self-pay | Admitting: Cardiovascular Disease

## 2024-02-22 NOTE — Telephone Encounter (Signed)
 Daughter Arma) stated patient had a bad fall 1-1/2 week ago and lives in an Assisted Living facility.  Daughter wants to know if patient can get orders for her lab work to be drawn at the facility instead of taking patient to a lab.

## 2024-02-22 NOTE — Telephone Encounter (Signed)
 Caller France Bright) at [facility] stated they will need patient's lab orders to have blood drawn.  Caller stated orders can be faxed to [phone].

## 2024-02-22 NOTE — Telephone Encounter (Signed)
 Lab orders faxed- Lipid panel and CMP due by 03/01/24

## 2024-02-22 NOTE — Telephone Encounter (Signed)
 Called pt daughter ok per DPR.  Reports pt had a fall last week and can't move from the bed to the wheelchair.  Pt is black and blue from the head down to knee on left side.  Wants to know if pt can have labs drawn at [facility].    Advised pt to ask staff what is required to have labs drawn and we can send over to facility.  Daughter will speak with facility staff and call back.

## 2024-03-01 LAB — LAB REPORT - SCANNED: EGFR: 48.1

## 2024-03-09 ENCOUNTER — Ambulatory Visit (HOSPITAL_COMMUNITY)
Admission: RE | Admit: 2024-03-09 | Discharge: 2024-03-09 | Disposition: A | Discharge status: Home / Self Care | Source: Ambulatory Visit | Attending: Internal Medicine | Admitting: Internal Medicine

## 2024-03-09 DIAGNOSIS — I4819 Other persistent atrial fibrillation: Secondary | ICD-10-CM | POA: Diagnosis not present

## 2024-03-09 DIAGNOSIS — R079 Chest pain, unspecified: Secondary | ICD-10-CM | POA: Diagnosis present

## 2024-03-09 LAB — ECHOCARDIOGRAM COMPLETE
AR max vel: 1.64 cm2
AV Area VTI: 2.31 cm2
AV Area mean vel: 1.98 cm2
AV Mean grad: 3 mmHg
AV Peak grad: 6.5 mmHg
Ao pk vel: 1.27 m/s
Area-P 1/2: 3.43 cm2
MV M vel: 5.07 m/s
MV Peak grad: 102.8 mmHg
Radius: 0.7 cm
S' Lateral: 3.1 cm

## 2024-03-14 ENCOUNTER — Ambulatory Visit: Payer: Self-pay | Admitting: Physician Assistant

## 2024-03-15 ENCOUNTER — Ambulatory Visit: Admitting: Physician Assistant

## 2024-03-17 NOTE — Progress Notes (Signed)
 Thank you for the update!

## 2024-03-17 NOTE — Progress Notes (Signed)
 Patient daughter Rudolph called back and stated that she rescheduled patient appointment from 03/15/24 to 04/10/24 @ 10:00, due that the facility that the patient is in forgot she had an appointment. Daughter will join her on her appointment on 12/01 to go over patient echo results.

## 2024-03-17 NOTE — Progress Notes (Signed)
 Attempted to call patient for echo results, no answer left a vm to call back.

## 2024-04-10 ENCOUNTER — Encounter: Payer: Self-pay | Admitting: Physician Assistant

## 2024-04-10 ENCOUNTER — Ambulatory Visit: Attending: Physician Assistant | Admitting: Physician Assistant

## 2024-04-10 VITALS — BP 140/80 | HR 89 | Resp 14 | Ht 62.0 in | Wt 106.0 lb

## 2024-04-10 DIAGNOSIS — I609 Nontraumatic subarachnoid hemorrhage, unspecified: Secondary | ICD-10-CM | POA: Diagnosis not present

## 2024-04-10 DIAGNOSIS — I4821 Permanent atrial fibrillation: Secondary | ICD-10-CM | POA: Diagnosis not present

## 2024-04-10 DIAGNOSIS — R0789 Other chest pain: Secondary | ICD-10-CM

## 2024-04-10 NOTE — Patient Instructions (Signed)
 Medication Instructions:  NO CHANGES *If you need a refill on your cardiac medications before your next appointment, please call your pharmacy*  Lab Work: NO LABS If you have labs (blood work) drawn today and your tests are completely normal, you will receive your results only by: MyChart Message (if you have MyChart) OR A paper copy in the mail If you have any lab test that is abnormal or we need to change your treatment, we will call you to review the results.  Testing/Procedures: NO TESTING  Follow-Up: At [facility], you and your health needs are our priority.  As part of our continuing mission to provide you with exceptional heart care, our providers are all part of one team.  This team includes your primary Cardiologist (physician) and Advanced Practice Providers or APPs (Physician Assistants and Nurse Practitioners) who all work together to provide you with the care you need, when you need it.  Your next appointment:   6 month(s)  Provider:   Oneil Bigness, MD

## 2024-04-10 NOTE — Progress Notes (Unsigned)
 Cardiology Office Note   Date:  04/10/2024  ID:  Toshiye Kever, DOB 1934-11-02, MRN [id number] PCP: Oneita Charmaine Agent, NP  [facility] Providers Cardiologist:  [name], MD { Click to update primary MD,subspecialty MD or APP then REFRESH:1}    History of Present Illness Robby Pirani is a 89 y.o. female with past medical history of permanent A-fib, mitral valve prolapse, dementia, hypotension, and interstitial lung disease.  Patient moved from [facility] [place]  to [place]  in October 2021.  Per family report, patient has been in A-fib for over 20 years.  Cardiology was initially consulted for HFpEF during admission in 2022 after a fall. Echocardiogram at the time showed EF 55 to 60%, no wall motion abnormality, RVSP 29.6 mmHg, mild to moderate mitral regurgitation. Heart rate was well-controlled while in A-fib.  She lives at [place] assisted living facility.  She has been taking Xarelto  and digoxin .  She had difficulty with other rate controlling agent.  She reportedly has been diagnosed with interstitial lung disease and recommended continued surveillance.  She used to be Florinef  and midodrine  for her chronic hypotension, however both medication has been weaned off over the years.  Her low blood pressure has also resolved.     Patient was seen by Dr. Decent in March 2023, she was on 40 mg daily of Lasix .  Her blood pressure was okay at that time and she was no longer on midodrine .  More recently, patient was admitted to the hospital on 01/28/2024 with left-sided lower chest pain and troponin was elevated.  Serial troponin 602-->659.  White blood cell count 14.8.  Digoxin  level 0.6, subtherapeutic.  Viral panel negative.  CTA of the chest was negative for PE, chronic subpleural fibrotic changes were seen in the left upper lobe.  Chest x-ray showed stable bilateral scarring, no acute airspace disease.  She had long discussion with cardiology team and expressed her  preference not to intervene with PCI and instead attempt medical management.  Dr. Ladona who saw the patient in the hospital initially recommended cardiac catheterization, however her chest pain is very atypical and patient was clear that she did not wish to proceed with cardiac catheterization.  She was placed on Isordil  for pain control.  Aspirin  81 mg daily was added on top of Xarelto .  EKG showed atrial fibrillation, heart rate 80s, diffuse ST downsloping.   I last saw the patient on 02/02/2024 at which time she described chest discomfort that was reproducible with palpation.  She was able to walk to the cafeteria at [facility] assisted living facility on daily basis without exertional chest discomfort.  I recommended an echocardiogram to reassess her ejection fraction.  She was able to tolerate the lisinopril and the Imdur  that was added during the recent hospitalization.  I uptitrated atorvastatin  to 20 mg daily to get her LDL to be less than 70.  Subsequent echocardiogram obtained on 03/09/2024 showed EF 60 to 65%, normal RV, RVSP 44.5 mmHg, severe LAE, mild to moderate MR with prolapse of both leaflet.  Unfortunately, the day after she saw me, she had a fall at the assisted living facility.  She rolled over in bed and rolled to the ground in the middle of the night.  She was complaining of left hand pain.  Hand x-ray was negative for fractures.  CT of the cervical spine and CT of the head showed no acute traumatic injury or intracranial abnormality.  She returned to the emergency room on 02/28/2024 with  another fall and hit her head.  CT of the head and neck showed 1-1.5 cm acute subarachnoid hemorrhage over the left frontal lobe, no mass effect or midline shift, moderate left frontal scalp contusion, moderate spondylosis throughout the cervical spine.  ED physician discussed with Dr. Lanis of neurosurgery who recommended repeat CT image.  Repeat CTA of the head obtained 6 hours later showed improvement  in the size of subarachnoid hemorrhage.  Patient presents today to cardiology follow-up accompanied by daughter.  I reviewed the recent echocardiogram with the patient.  I did a full neuroexam on the patient, she has chronic headache however headache has not worsened since the fall and subarachnoid hemorrhage.  She does not have any focal logical deficit.  She has no lower extremity edema.  Her heart rate is irregular due to A-fib however heart rate is well-controlled in the 60s on exam.  She can follow-up with Dr. Barbaraann in 6 months.     ROS: ***  Studies Reviewed      *** Risk Assessment/Calculations {Does this patient have ATRIAL FIBRILLATION?:[phone]} No BP recorded.  {Refresh Note OR Click here to enter BP  :1}***       Physical Exam VS:  There were no vitals taken for this visit.       Wt Readings from Last 3 Encounters:  02/11/24 108 lb 0.4 oz (49 kg)  02/03/24 109 lb (49.4 kg)  02/02/24 104 lb 6.4 oz (47.4 kg)    GEN: Well nourished, well developed in no acute distress NECK: No JVD; No carotid bruits CARDIAC: ***RRR, no murmurs, rubs, gallops RESPIRATORY:  Clear to auscultation without rales, wheezing or rhonchi  ABDOMEN: Soft, non-tender, non-distended EXTREMITIES:  No edema; No deformity   ASSESSMENT AND PLAN ***    {Are you ordering a CV Procedure (e.g. stress test, cath, DCCV, TEE, etc)?   Press F2        :[id number]}  Dispo: ***  Signed, Scot Ford, PA

## 2025-01-22 ENCOUNTER — Ambulatory Visit: Admitting: Neurology
